# Patient Record
Sex: Female | Born: 1945
Health system: Southern US, Community
[De-identification: ages and names within clinical notes are randomized; demographics above are authoritative.]

## PROBLEM LIST (undated history)

## (undated) DIAGNOSIS — I82409 Acute embolism and thrombosis of unspecified deep veins of unspecified lower extremity: Secondary | ICD-10-CM

## (undated) DIAGNOSIS — I219 Acute myocardial infarction, unspecified: Secondary | ICD-10-CM

## (undated) DIAGNOSIS — E119 Type 2 diabetes mellitus without complications: Secondary | ICD-10-CM

## (undated) DIAGNOSIS — I10 Essential (primary) hypertension: Secondary | ICD-10-CM

## (undated) DIAGNOSIS — E785 Hyperlipidemia, unspecified: Secondary | ICD-10-CM

## (undated) DIAGNOSIS — I639 Cerebral infarction, unspecified: Secondary | ICD-10-CM

## (undated) DIAGNOSIS — M1990 Unspecified osteoarthritis, unspecified site: Secondary | ICD-10-CM

## (undated) DIAGNOSIS — I2510 Atherosclerotic heart disease of native coronary artery without angina pectoris: Secondary | ICD-10-CM

## (undated) HISTORY — DX: Acute embolism and thrombosis of unspecified deep veins of unspecified lower extremity: I82.409

## (undated) HISTORY — PX: CATARACT EXTRACTION: SUR2

## (undated) HISTORY — DX: Hyperlipidemia, unspecified: E78.5

## (undated) HISTORY — PX: CORONARY ANGIOPLASTY WITH STENT PLACEMENT: SHX49

---

## 2011-12-31 ENCOUNTER — Emergency Department (HOSPITAL_BASED_OUTPATIENT_CLINIC_OR_DEPARTMENT_OTHER)
Admission: EM | Admit: 2011-12-31 | Discharge: 2011-12-31 | Disposition: A | Discharge status: Home / Self Care | Payer: Managed Care, Other (non HMO) | Attending: Emergency Medicine | Admitting: Emergency Medicine

## 2011-12-31 ENCOUNTER — Encounter (HOSPITAL_BASED_OUTPATIENT_CLINIC_OR_DEPARTMENT_OTHER): Payer: Self-pay | Admitting: *Deleted

## 2011-12-31 ENCOUNTER — Emergency Department (HOSPITAL_BASED_OUTPATIENT_CLINIC_OR_DEPARTMENT_OTHER): Payer: Managed Care, Other (non HMO)

## 2011-12-31 DIAGNOSIS — Z79899 Other long term (current) drug therapy: Secondary | ICD-10-CM | POA: Insufficient documentation

## 2011-12-31 DIAGNOSIS — R0602 Shortness of breath: Secondary | ICD-10-CM | POA: Insufficient documentation

## 2011-12-31 DIAGNOSIS — Z8739 Personal history of other diseases of the musculoskeletal system and connective tissue: Secondary | ICD-10-CM | POA: Insufficient documentation

## 2011-12-31 DIAGNOSIS — E119 Type 2 diabetes mellitus without complications: Secondary | ICD-10-CM | POA: Insufficient documentation

## 2011-12-31 DIAGNOSIS — I2510 Atherosclerotic heart disease of native coronary artery without angina pectoris: Secondary | ICD-10-CM | POA: Insufficient documentation

## 2011-12-31 DIAGNOSIS — T50905A Adverse effect of unspecified drugs, medicaments and biological substances, initial encounter: Secondary | ICD-10-CM

## 2011-12-31 DIAGNOSIS — Z7982 Long term (current) use of aspirin: Secondary | ICD-10-CM | POA: Insufficient documentation

## 2011-12-31 DIAGNOSIS — Z794 Long term (current) use of insulin: Secondary | ICD-10-CM | POA: Insufficient documentation

## 2011-12-31 DIAGNOSIS — I10 Essential (primary) hypertension: Secondary | ICD-10-CM | POA: Insufficient documentation

## 2011-12-31 DIAGNOSIS — R197 Diarrhea, unspecified: Secondary | ICD-10-CM | POA: Insufficient documentation

## 2011-12-31 DIAGNOSIS — T465X5A Adverse effect of other antihypertensive drugs, initial encounter: Secondary | ICD-10-CM | POA: Insufficient documentation

## 2011-12-31 DIAGNOSIS — Z8673 Personal history of transient ischemic attack (TIA), and cerebral infarction without residual deficits: Secondary | ICD-10-CM | POA: Insufficient documentation

## 2011-12-31 DIAGNOSIS — R42 Dizziness and giddiness: Secondary | ICD-10-CM | POA: Insufficient documentation

## 2011-12-31 DIAGNOSIS — N390 Urinary tract infection, site not specified: Secondary | ICD-10-CM

## 2011-12-31 DIAGNOSIS — I252 Old myocardial infarction: Secondary | ICD-10-CM | POA: Insufficient documentation

## 2011-12-31 DIAGNOSIS — R112 Nausea with vomiting, unspecified: Secondary | ICD-10-CM | POA: Insufficient documentation

## 2011-12-31 HISTORY — DX: Type 2 diabetes mellitus without complications: E11.9

## 2011-12-31 HISTORY — DX: Acute myocardial infarction, unspecified: I21.9

## 2011-12-31 HISTORY — DX: Cerebral infarction, unspecified: I63.9

## 2011-12-31 HISTORY — DX: Essential (primary) hypertension: I10

## 2011-12-31 HISTORY — DX: Unspecified osteoarthritis, unspecified site: M19.90

## 2011-12-31 HISTORY — DX: Atherosclerotic heart disease of native coronary artery without angina pectoris: I25.10

## 2011-12-31 LAB — CBC WITH DIFFERENTIAL/PLATELET
Basophils Relative: 0 % (ref 0–1)
HCT: 39 % (ref 36.0–46.0)
Hemoglobin: 13.4 g/dL (ref 12.0–15.0)
Lymphocytes Relative: 32 % (ref 12–46)
Lymphs Abs: 2.9 10*3/uL (ref 0.7–4.0)
Monocytes Absolute: 0.5 10*3/uL (ref 0.1–1.0)
Monocytes Relative: 6 % (ref 3–12)
Neutro Abs: 5.1 10*3/uL (ref 1.7–7.7)
Neutrophils Relative %: 57 % (ref 43–77)
RBC: 4.67 MIL/uL (ref 3.87–5.11)
WBC: 9 10*3/uL (ref 4.0–10.5)

## 2011-12-31 LAB — URINALYSIS, ROUTINE W REFLEX MICROSCOPIC
Bilirubin Urine: NEGATIVE
Glucose, UA: 100 mg/dL — AB
Ketones, ur: NEGATIVE mg/dL
Nitrite: NEGATIVE
Protein, ur: >300 mg/dL — AB
pH: 5.5 (ref 5.0–8.0)

## 2011-12-31 LAB — COMPREHENSIVE METABOLIC PANEL
Albumin: 4.1 g/dL (ref 3.5–5.2)
Alkaline Phosphatase: 55 U/L (ref 39–117)
BUN: 13 mg/dL (ref 6–23)
CO2: 23 mEq/L (ref 19–32)
Chloride: 100 mEq/L (ref 96–112)
GFR calc non Af Amer: 65 mL/min — ABNORMAL LOW (ref >90)
Glucose, Bld: 91 mg/dL (ref 70–99)
Potassium: 3.1 mEq/L — ABNORMAL LOW (ref 3.5–5.1)
Total Bilirubin: 0.4 mg/dL (ref 0.3–1.2)

## 2011-12-31 LAB — URINE MICROSCOPIC-ADD ON

## 2011-12-31 LAB — TROPONIN I: Troponin I: <0.3 ng/mL (ref <0.30)

## 2011-12-31 MED ORDER — NITROFURANTOIN MONOHYD MACRO 100 MG PO CAPS
100.0000 mg | ORAL_CAPSULE | Freq: Once | ORAL | Status: AC
  Administered 2011-12-31: 100 mg via ORAL
  Filled 2011-12-31: qty 1

## 2011-12-31 MED ORDER — MECLIZINE HCL 25 MG PO TABS
25.0000 mg | ORAL_TABLET | Freq: Once | ORAL | Status: AC
  Administered 2011-12-31: 25 mg via ORAL

## 2011-12-31 MED ORDER — SODIUM CHLORIDE 0.9 % IV SOLN
INTRAVENOUS | Status: DC
  Administered 2011-12-31: 21:00:00 via INTRAVENOUS

## 2011-12-31 MED ORDER — MECLIZINE HCL 25 MG PO TABS
ORAL_TABLET | ORAL | Status: AC
  Filled 2011-12-31: qty 1

## 2011-12-31 MED ORDER — POTASSIUM CHLORIDE CRYS ER 20 MEQ PO TBCR
40.0000 meq | EXTENDED_RELEASE_TABLET | Freq: Once | ORAL | Status: AC
  Administered 2011-12-31: 40 meq via ORAL
  Filled 2011-12-31: qty 2

## 2011-12-31 MED ORDER — ONDANSETRON HCL 4 MG/2ML IJ SOLN
INTRAMUSCULAR | Status: AC
  Administered 2011-12-31: 4 mg via INTRAVENOUS
  Filled 2011-12-31: qty 2

## 2011-12-31 MED ORDER — ONDANSETRON HCL 4 MG/2ML IJ SOLN
4.0000 mg | Freq: Once | INTRAMUSCULAR | Status: AC
  Administered 2011-12-31: 4 mg via INTRAVENOUS

## 2011-12-31 MED ORDER — MECLIZINE HCL 25 MG PO TABS: 25.0000 mg | ORAL_TABLET | Freq: Four times a day (QID) | ORAL | Status: DC

## 2011-12-31 MED ORDER — NITROFURANTOIN MONOHYD MACRO 100 MG PO CAPS: 100.0000 mg | ORAL_CAPSULE | Freq: Two times a day (BID) | ORAL | Status: DC

## 2011-12-31 NOTE — ED Notes (Signed)
Pt reports chest pain "under both breasts" x 2 days- vomited x 3 today- c/o dizziness and sob

## 2011-12-31 NOTE — ED Notes (Signed)
pain L breast area and L arm with palpation and movement

## 2011-12-31 NOTE — ED Provider Notes (Signed)
History  This chart was scribed for Carleene Cooper III, MD by Ardeen Jourdain, ED Scribe. This patient was seen in room MH01/MH01 and the patient's care was started at 1903.  CSN: [id number]  Arrival date & time 12/31/11  1815   None     Chief Complaint  Patient presents with  . Chest Pain     The history is provided by the patient. No language interpreter was used.   Brittney [name] is a 66 y.o. female who presents to the Emergency Department complaining of CP with associated dizziness, fever, diaphoresis, diarrhea and emesis. She reports the symptoms started 2 days ago and have been gradually worsening. She denies SOB, hearing problems, ear ache, productive cough, sore throat, rash, syncope, visual disturbances and difficult or painful urination as associated symptoms. She also reports she has had 3 episodes of emesis today and describes her diarrhea as watery. She states movement aggravates her dizziness. She states she started a new medication, hydralazine,  about 2 days ago which she believed was the cause of the symptoms. She describes the CP as located under her breasts and only occuring when the area is touched. She reports the pain feels similar to when she had pneumonia in the past. She has a h/o MI, DM, HTN, CAD, arthritis and CVA.    Past Medical History  Diagnosis Date  . Heart attack   . Diabetes mellitus without complication   . Hypertension   . Coronary artery disease   . Arthritis   . Stroke     Past Surgical History  Procedure Date  . Coronary angioplasty with stent placement   . Cataract extraction   . Cesarean section     No family history on file.  History  Substance Use Topics  . Smoking status: Never Smoker   . Smokeless tobacco: Never Used  . Alcohol Use: No   No OB history available.  Review of Systems  Constitutional: Positive for fever and diaphoresis.  HENT: Negative for hearing loss, ear pain, congestion, sore throat and tinnitus.   Eyes:  Negative for visual disturbance.  Respiratory: Negative for cough and shortness of breath.   Cardiovascular: Positive for chest pain.  Gastrointestinal: Positive for nausea, vomiting and diarrhea. Negative for abdominal pain.  Genitourinary: Negative for dysuria and difficulty urinating.  Skin: Negative for rash.  Neurological: Positive for dizziness. Negative for syncope.  All other systems reviewed and are negative.    Allergies  Iodinated diagnostic agents and Shellfish allergy  Home Medications   Current Outpatient Rx  Name  Route  Sig  Dispense  Refill  . AMLODIPINE BESYLATE 10 MG PO TABS   Oral   Take 10 mg by mouth daily.         . ASPIRIN 81 MG PO TABS   Oral   Take 81 mg by mouth daily.         . ATORVASTATIN CALCIUM 40 MG PO TABS   Oral   Take 40 mg by mouth daily.         Marland Kitchen CLOPIDOGREL BISULFATE 75 MG PO TABS   Oral   Take 75 mg by mouth daily.         . ENALAPRIL MALEATE 20 MG PO TABS   Oral   Take 20 mg by mouth daily.         . FUROSEMIDE 40 MG PO TABS   Oral   Take 40 mg by mouth daily.         Marland Kitchen  GABAPENTIN 300 MG PO CAPS   Oral   Take 300 mg by mouth 3 (three) times daily.         Marland Kitchen HYDRALAZINE HCL 10 MG PO TABS   Oral   Take 10 mg by mouth 3 (three) times daily.         Marland Kitchen NOVOLOG FLEXPEN [place]   Subcutaneous   Inject into the skin.         . INSULIN GLARGINE 100 UNIT/ML [place] SOLN   Subcutaneous   Inject 62 Units into the skin at bedtime.         Marland Kitchen METFORMIN HCL 1000 MG PO TABS   Oral   Take 1,000 mg by mouth 2 (two) times daily with a meal.         . METOPROLOL SUCCINATE ER 100 MG PO TB24   Oral   Take 100 mg by mouth daily. Take with or immediately following a meal.         . OXYBUTYNIN CHLORIDE ER PO   Oral   Take 10 mg by mouth daily.           Triage Vitals: BP 183/72  Pulse 63  Temp 97.9 F (36.6 C) (Oral)  Resp 19  Ht 5\' 7"  (1.702 m)  Wt 230 lb (104.327 kg)  BMI 36.02 kg/m2  SpO2  100%  Physical Exam  Nursing note and vitals reviewed. Constitutional: She is oriented to person, place, and time. She appears well-developed and well-nourished. No distress.  HENT:  Head: Normocephalic and atraumatic.  Right Ear: External ear normal.  Left Ear: External ear normal.  Mouth/Throat: Oropharynx is clear and moist. No oropharyngeal exudate.  Eyes: Conjunctivae normal and EOM are normal. Pupils are equal, round, and reactive to light.       Nystagmus bilaterally   Neck: Normal range of motion. Neck supple. No tracheal deviation present.  Cardiovascular: Normal rate, regular rhythm and normal heart sounds.   Pulmonary/Chest: Effort normal and breath sounds normal. No respiratory distress. She exhibits no tenderness.  Abdominal: Soft. Bowel sounds are normal. She exhibits no distension. There is no tenderness.       Mildly protuberant but no mass or deformity   Musculoskeletal: Normal range of motion. She exhibits no edema.  Neurological: She is alert and oriented to person, place, and time. No cranial nerve deficit.       Sensory and motor functions intact   Skin: Skin is warm and dry.  Psychiatric: She has a normal mood and affect. Her behavior is normal.    ED Course  Procedures (including critical care time)  DIAGNOSTIC STUDIES: Oxygen Saturation is 100% on room air, normal by my interpretation.    COORDINATION OF CARE:   Labs Reviewed  CBC WITH DIFFERENTIAL  COMPREHENSIVE METABOLIC PANEL  TROPONIN I   6:55 PM  Date: 12/31/2011  Rate:61  Rhythm: normal sinus rhythm  QRS Axis: left  Intervals: normal QRS:  Poor R wave progression in precordial leads suggests old anterior myocardial infarction.  Left ventricular hypertrophy.  ST/T Wave abnormalities: normal  Conduction Disutrbances:none  Narrative Interpretation: Abnormal EKG  Old EKG Reviewed: none available   7:00 PM: Discussed treatment plan which includes an EKG, CXR, blood work, urinalysis and  medication for dizziness with pt at bedside and pt agreed to plan.  8:11 PM Vomiting in radiology suite.  Rx Zofran 4 mg IV.    8:52 PM Results for orders placed during the hospital encounter of 12/31/11  CBC WITH DIFFERENTIAL      Component Value Range   WBC 9.0  4.0 - 10.5 K/uL   RBC 4.67  3.87 - 5.11 MIL/uL   Hemoglobin 13.4  12.0 - 15.0 g/dL   HCT 16.1  09.6 - 04.5 %   MCV 83.5  78.0 - 100.0 fL   MCH 28.7  26.0 - 34.0 pg   MCHC 34.4  30.0 - 36.0 g/dL   RDW 40.9  81.1 - 91.4 %   Platelets 290  150 - 400 K/uL   Neutrophils Relative 57  43 - 77 %   Neutro Abs 5.1  1.7 - 7.7 K/uL   Lymphocytes Relative 32  12 - 46 %   Lymphs Abs 2.9  0.7 - 4.0 K/uL   Monocytes Relative 6  3 - 12 %   Monocytes Absolute 0.5  0.1 - 1.0 K/uL   Eosinophils Relative 5  0 - 5 %   Eosinophils Absolute 0.5  0.0 - 0.7 K/uL   Basophils Relative 0  0 - 1 %   Basophils Absolute 0.0  0.0 - 0.1 K/uL  COMPREHENSIVE METABOLIC PANEL      Component Value Range   Sodium 140  135 - 145 mEq/L   Potassium 3.1 (*) 3.5 - 5.1 mEq/L   Chloride 100  96 - 112 mEq/L   CO2 23  19 - 32 mEq/L   Glucose, Bld 91  70 - 99 mg/dL   BUN 13  6 - 23 mg/dL   Creatinine, Ser 7.82  0.50 - 1.10 mg/dL   Calcium 9.8  8.4 - 95.6 mg/dL   Total Protein 8.9 (*) 6.0 - 8.3 g/dL   Albumin 4.1  3.5 - 5.2 g/dL   AST 24  0 - 37 U/L   ALT 21  0 - 35 U/L   Alkaline Phosphatase 55  39 - 117 U/L   Total Bilirubin 0.4  0.3 - 1.2 mg/dL   GFR calc non Af Amer 65 (*) >90 mL/min   GFR calc Af Amer 76 (*) >90 mL/min  TROPONIN I      Component Value Range   Troponin I <0.30  <0.30 ng/mL   Dg Chest 1 View  12/31/2011  *RADIOLOGY REPORT*  Clinical Data: Anterior pleuritic chest pain.  CHEST - 1 VIEW  Comparison: None.  Findings: The heart size and vascularity are normal and the lungs are clear.  No acute osseous abnormality.  IMPRESSION: Normal chest.   Original Report Authenticated By: Francene Boyers, M.D.     K low at 3.1.  Pt to take 40 meq KCL  po.  IV with normal saline 250 ml per hour ordered, as pt has not been able to urinate.  She is no longer nauseated, following IV Zofran.  11:01 PM UA shows a UTI. I discussed pt's condition with her.  She had onset of the dizziness after starting hydralazine.  I think that her symptoms are from adverse reaction to hydralazine.  I advised her to stop hydralazine.  Rx Antivert 25 mg qid for dizziness, macrodantin 100 mg bid x 7 days for UTI, F/U with Dr. Luiz Iron in the office.  1. Vertigo   2. Adverse drug reaction   3. Urinary tract infection     I personally performed the services described in this documentation, which was scribed in my presence. The recorded information has been reviewed and is accurate. Osvaldo Human, MD  Carleene Cooper III, MD 12/31/11 [phone]

## 2011-12-31 NOTE — ED Notes (Signed)
EDP Ignacia Palma updated on pt status- ekg given to be reviewed

## 2012-01-01 ENCOUNTER — Encounter (HOSPITAL_BASED_OUTPATIENT_CLINIC_OR_DEPARTMENT_OTHER): Payer: Self-pay | Admitting: *Deleted

## 2012-01-01 ENCOUNTER — Emergency Department (HOSPITAL_BASED_OUTPATIENT_CLINIC_OR_DEPARTMENT_OTHER)
Admission: EM | Admit: 2012-01-01 | Discharge: 2012-01-01 | Disposition: A | Discharge status: Hospice | Payer: Managed Care, Other (non HMO) | Attending: Emergency Medicine | Admitting: Emergency Medicine

## 2012-01-01 DIAGNOSIS — I10 Essential (primary) hypertension: Secondary | ICD-10-CM | POA: Insufficient documentation

## 2012-01-01 DIAGNOSIS — R0789 Other chest pain: Secondary | ICD-10-CM | POA: Insufficient documentation

## 2012-01-01 DIAGNOSIS — R112 Nausea with vomiting, unspecified: Secondary | ICD-10-CM | POA: Insufficient documentation

## 2012-01-01 DIAGNOSIS — R197 Diarrhea, unspecified: Secondary | ICD-10-CM | POA: Insufficient documentation

## 2012-01-01 DIAGNOSIS — Z8673 Personal history of transient ischemic attack (TIA), and cerebral infarction without residual deficits: Secondary | ICD-10-CM | POA: Insufficient documentation

## 2012-01-01 DIAGNOSIS — Z79899 Other long term (current) drug therapy: Secondary | ICD-10-CM | POA: Insufficient documentation

## 2012-01-01 DIAGNOSIS — Z7902 Long term (current) use of antithrombotics/antiplatelets: Secondary | ICD-10-CM | POA: Insufficient documentation

## 2012-01-01 DIAGNOSIS — I2510 Atherosclerotic heart disease of native coronary artery without angina pectoris: Secondary | ICD-10-CM | POA: Insufficient documentation

## 2012-01-01 DIAGNOSIS — H9319 Tinnitus, unspecified ear: Secondary | ICD-10-CM | POA: Insufficient documentation

## 2012-01-01 DIAGNOSIS — R42 Dizziness and giddiness: Secondary | ICD-10-CM | POA: Insufficient documentation

## 2012-01-01 DIAGNOSIS — Z7982 Long term (current) use of aspirin: Secondary | ICD-10-CM | POA: Insufficient documentation

## 2012-01-01 DIAGNOSIS — E119 Type 2 diabetes mellitus without complications: Secondary | ICD-10-CM | POA: Insufficient documentation

## 2012-01-01 DIAGNOSIS — Z794 Long term (current) use of insulin: Secondary | ICD-10-CM | POA: Insufficient documentation

## 2012-01-01 DIAGNOSIS — Z9861 Coronary angioplasty status: Secondary | ICD-10-CM | POA: Insufficient documentation

## 2012-01-01 DIAGNOSIS — Z8739 Personal history of other diseases of the musculoskeletal system and connective tissue: Secondary | ICD-10-CM | POA: Insufficient documentation

## 2012-01-01 LAB — DIFFERENTIAL
Basophils Relative: 0 % (ref 0–1)
Eosinophils Relative: 2 % (ref 0–5)
Monocytes Absolute: 0.7 10*3/uL (ref 0.1–1.0)
Monocytes Relative: 8 % (ref 3–12)
Neutro Abs: 6.7 10*3/uL (ref 1.7–7.7)

## 2012-01-01 LAB — COMPREHENSIVE METABOLIC PANEL
Albumin: 3.7 g/dL (ref 3.5–5.2)
BUN: 12 mg/dL (ref 6–23)
Chloride: 104 mEq/L (ref 96–112)
Creatinine, Ser: 0.8 mg/dL (ref 0.50–1.10)
GFR calc non Af Amer: 75 mL/min — ABNORMAL LOW (ref >90)
Total Bilirubin: 0.4 mg/dL (ref 0.3–1.2)

## 2012-01-01 LAB — CBC
HCT: 36.9 % (ref 36.0–46.0)
Hemoglobin: 12.6 g/dL (ref 12.0–15.0)
MCH: 28.6 pg (ref 26.0–34.0)
MCHC: 34.1 g/dL (ref 30.0–36.0)
MCV: 83.7 fL (ref 78.0–100.0)

## 2012-01-01 LAB — TROPONIN I: Troponin I: <0.3 ng/mL (ref <0.30)

## 2012-01-01 LAB — APTT: aPTT: 35 seconds (ref 24–37)

## 2012-01-01 LAB — GLUCOSE, CAPILLARY: Glucose-Capillary: 122 mg/dL — ABNORMAL HIGH (ref 70–99)

## 2012-01-01 MED ORDER — SODIUM CHLORIDE 0.9 % IV SOLN
Freq: Once | INTRAVENOUS | Status: AC
  Administered 2012-01-01: 12:00:00 via INTRAVENOUS

## 2012-01-01 MED ORDER — MECLIZINE HCL 25 MG PO TABS
25.0000 mg | ORAL_TABLET | Freq: Once | ORAL | Status: AC
  Administered 2012-01-01: 25 mg via ORAL
  Filled 2012-01-01: qty 1

## 2012-01-01 MED ORDER — LORAZEPAM 2 MG/ML IJ SOLN
1.0000 mg | Freq: Once | INTRAMUSCULAR | Status: AC
  Administered 2012-01-01: 1 mg via INTRAVENOUS
  Filled 2012-01-01: qty 1

## 2012-01-01 MED ORDER — METOCLOPRAMIDE HCL 5 MG/ML IJ SOLN
10.0000 mg | Freq: Once | INTRAMUSCULAR | Status: AC
  Administered 2012-01-01: 10 mg via INTRAVENOUS
  Filled 2012-01-01: qty 2

## 2012-01-01 NOTE — ED Notes (Signed)
Blood collected via arterial stick by Gwenlyn Perking, RRT.  Pt tolerated well.

## 2012-01-01 NOTE — ED Notes (Signed)
Continues to be nauseated and feels weak at intervals she was seen here last pm has recently started hydralazine and thinks this may be causing her symptoms

## 2012-01-01 NOTE — ED Notes (Signed)
Attempted IV access and blood draw x 3 unsuccessful.

## 2012-01-01 NOTE — ED Notes (Signed)
MD at bedside. 

## 2012-01-01 NOTE — ED Notes (Signed)
Report called to Trula Ore, RN [facility]

## 2012-01-01 NOTE — ED Notes (Signed)
No IV access per EMS attempts x 3.  Pt reports improvement of nausea after dose of IM Zofran. Pt refuses to put a gown on for exam.

## 2012-01-01 NOTE — ED Provider Notes (Signed)
History     CSN: [id number]  Arrival date & time 01/01/12  1025   First MD Initiated Contact with Patient 01/01/12 1040      Chief Complaint  Patient presents with  . Nausea    (Consider location/radiation/quality/duration/timing/severity/associated sxs/prior treatment) HPI This 66 year old female has 4 days gradual onset vertigo brought on by any position changes and resolves if she stays perfectly still, every time she moves her head or body or tries to walk she has a feeling of is that she is spinning of nausea and vomits, she has vomited several times a day the last 4 days, she also has had one or 2 loose stools per day for last 4 days, she is a constant nonstop right anterior lower chest wall tenderness for several days, she did start taking a new blood pressure medicine within the last week and she thinks all her vertigo symptoms are related to that but had taken that medicine for a few days before her vertigo symptoms started, she has had no fever although she does feel cold over the last 4 days, she said no headache and no confusion, she is no change in speech vision swallowing or understanding, she is no focal or lateralizing weakness numbness or incoordination he states she's been unable to walk well for the last 4 days and cannot walk at all today due to her vertigo. She was given Zofran in the ED yesterday and Zofran prior to arrival by EMS today but states she cannot walk today do to the vertigo anytime she tries to move her head. She would not even try to sit up or walk in the ED upon arrival. She is no cough no shortness breath no abdominal pain. She is no trauma. She is no neck pain or back pain. She was seen yesterday in the emergency department for the same symptoms. She tried to take Antivert but couldn't because she vomited it as soon as she took it.  She does have a history of vertigo in the past but was not as bad as this.  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  Not Code Stroke since onset of Sxs 3 days ago. Past Medical History  Diagnosis Date  . Heart attack   . Diabetes mellitus without complication   . Hypertension   . Coronary artery disease   . Arthritis   . Stroke     Past Surgical History  Procedure Date  . Coronary angioplasty with stent placement   . Cataract extraction   . Cesarean section     History reviewed. No pertinent family history.  History  Substance Use Topics  . Smoking status: Never Smoker   . Smokeless tobacco: Never Used  . Alcohol Use: No    OB History    Grav Para Term Preterm Abortions TAB SAB Ect Mult Living                  Review of Systems 10 Systems reviewed and are negative for acute change except as noted in the HPI. Allergies  Iodinated diagnostic agents and Shellfish allergy  Home Medications   Current Outpatient Rx  Name  Route  Sig  Dispense  Refill  . AMLODIPINE BESYLATE 10 MG PO TABS   Oral   Take 10 mg by mouth daily.         . ASPIRIN 81 MG PO TABS   Oral   Take 81 mg by mouth  daily.         . ATORVASTATIN CALCIUM 40 MG PO TABS   Oral   Take 40 mg by mouth daily.         Marland Kitchen CLOPIDOGREL BISULFATE 75 MG PO TABS   Oral   Take 75 mg by mouth daily.         . ENALAPRIL MALEATE 20 MG PO TABS   Oral   Take 20 mg by mouth daily.         . FUROSEMIDE 40 MG PO TABS   Oral   Take 40 mg by mouth daily.         Marland Kitchen GABAPENTIN 300 MG PO CAPS   Oral   Take 300 mg by mouth 3 (three) times daily.         Marland Kitchen HYDRALAZINE HCL 10 MG PO TABS   Oral   Take 10 mg by mouth 3 (three) times daily.         Marland Kitchen NOVOLOG FLEXPEN [place]   Subcutaneous   Inject into the skin.         . INSULIN GLARGINE 100 UNIT/ML  SOLN   Subcutaneous   Inject 62 Units into the skin at bedtime.         Marland Kitchen MECLIZINE HCL 25 MG PO TABS   Oral   Take 1 tablet (25 mg total) by mouth 4 (four) times daily.   28 tablet   0   . METFORMIN HCL 1000 MG PO TABS    Oral   Take 1,000 mg by mouth 2 (two) times daily with a meal.         . METOPROLOL SUCCINATE ER 100 MG PO TB24   Oral   Take 100 mg by mouth daily. Take with or immediately following a meal.         . NITROFURANTOIN MONOHYD MACRO 100 MG PO CAPS   Oral   Take 1 capsule (100 mg total) by mouth 2 (two) times daily.   14 capsule   0   . OXYBUTYNIN CHLORIDE ER PO   Oral   Take 10 mg by mouth daily.           BP 161/47  Pulse 70  Temp 98.7 F (37.1 C) (Oral)  Resp 16  Ht 5\' 7"  (1.702 m)  Wt 230 lb (104.327 kg)  BMI 36.02 kg/m2  SpO2 100%  Physical Exam  Nursing note and vitals reviewed. Constitutional: She is oriented to person, place, and time.       Awake, alert, nontoxic appearance with baseline speech for patient.  HENT:  Head: Atraumatic.  Mouth/Throat: No oropharyngeal exudate.  Eyes: EOM are normal. Pupils are equal, round, and reactive to light. Right eye exhibits no discharge. Left eye exhibits no discharge.  Neck: Neck supple.  Cardiovascular: Normal rate and regular rhythm.   No murmur heard. Pulmonary/Chest: Effort normal and breath sounds normal. No stridor. No respiratory distress. She has no wheezes. She has no rales. She exhibits no tenderness.  Abdominal: Soft. Bowel sounds are normal. She exhibits no mass. There is no tenderness. There is no rebound.  Musculoskeletal: She exhibits no edema and no tenderness.       Baseline ROM, moves extremities with no obvious new focal weakness.  Lymphadenopathy:    She has no cervical adenopathy.  Neurological: She is alert and oriented to person, place, and time.       Awake, alert, cooperative and aware of situation; motor strength bilaterally;  sensation normal to light touch bilaterally; peripheral visual fields full to confrontation; no facial asymmetry; tongue midline; major cranial nerves appear intact; no pronator drift, normal finger to nose bilaterally, no nystagmus and no disconjugate gaze on initial  examination but the patient refuses to turn her head or sit up or walk during initial physical examination in the ED chart from yesterday notes that she did have nystagmus yesterday  Skin: No rash noted.  Psychiatric: She has a normal mood and affect.    ED Course  Procedures (including critical care time)  ECG: Sinus rhythm with occasional premature ventricular complexes, ventricular rate 63, normal axis, left ventricular hypertrophy, no significant change noted compared with 31 December 2011  Pt can briefly stand with assistance but cannot walk in ED, no nystagmus but subjective vertigo better but persists so [facility] paged at [facility] per Pt request vertigo r/o [id number]  [facility] accepting will call back with bed.[id number]   Labs Reviewed  COMPREHENSIVE METABOLIC PANEL - Abnormal; Notable for the following:    Potassium 3.4 (*)     Glucose, Bld 119 (*)     Total Protein 8.4 (*)     GFR calc non Af Amer 75 (*)     GFR calc Af Amer 87 (*)     All other components within normal limits  GLUCOSE, CAPILLARY - Abnormal; Notable for the following:    Glucose-Capillary 122 (*)     All other components within normal limits  PROTIME-INR  APTT  CBC  DIFFERENTIAL  TROPONIN I   No results found.   1. Vertigo       MDM  The patient appears reasonably stabilized for transfer considering the current resources, flow, and capabilities available in the ED at this time, and I doubt any other [facility] requiring further screening and/or treatment in the ED prior to transfer.        Hurman Horn, MD 01/09/12 [phone]

## 2012-01-01 NOTE — ED Notes (Signed)
Pt not ambulated.  She reports she continues to be dizzy and is unable to ambulate.  EDP speaking with consult.

## 2012-01-01 NOTE — ED Notes (Signed)
Report given to RN cassandra for room 621 at [facility] is here to transport pt to admission bed

## 2012-01-01 NOTE — ED Notes (Signed)
Unable to obtain blood draw with IV insertion.

## 2012-01-02 LAB — URINE CULTURE

## 2013-02-23 ENCOUNTER — Emergency Department (HOSPITAL_BASED_OUTPATIENT_CLINIC_OR_DEPARTMENT_OTHER): Payer: Managed Care, Other (non HMO)

## 2013-02-23 ENCOUNTER — Encounter (HOSPITAL_BASED_OUTPATIENT_CLINIC_OR_DEPARTMENT_OTHER): Payer: Self-pay | Admitting: Emergency Medicine

## 2013-02-23 ENCOUNTER — Emergency Department (HOSPITAL_BASED_OUTPATIENT_CLINIC_OR_DEPARTMENT_OTHER)
Admission: EM | Admit: 2013-02-23 | Discharge: 2013-02-23 | Disposition: A | Discharge status: Home / Self Care | Payer: Managed Care, Other (non HMO) | Attending: Emergency Medicine | Admitting: Emergency Medicine

## 2013-02-23 DIAGNOSIS — H9209 Otalgia, unspecified ear: Secondary | ICD-10-CM | POA: Insufficient documentation

## 2013-02-23 DIAGNOSIS — R05 Cough: Secondary | ICD-10-CM | POA: Insufficient documentation

## 2013-02-23 DIAGNOSIS — M79671 Pain in right foot: Secondary | ICD-10-CM

## 2013-02-23 DIAGNOSIS — Z7982 Long term (current) use of aspirin: Secondary | ICD-10-CM | POA: Insufficient documentation

## 2013-02-23 DIAGNOSIS — Z8673 Personal history of transient ischemic attack (TIA), and cerebral infarction without residual deficits: Secondary | ICD-10-CM | POA: Insufficient documentation

## 2013-02-23 DIAGNOSIS — I10 Essential (primary) hypertension: Secondary | ICD-10-CM | POA: Insufficient documentation

## 2013-02-23 DIAGNOSIS — R209 Unspecified disturbances of skin sensation: Secondary | ICD-10-CM | POA: Insufficient documentation

## 2013-02-23 DIAGNOSIS — H5789 Other specified disorders of eye and adnexa: Secondary | ICD-10-CM | POA: Insufficient documentation

## 2013-02-23 DIAGNOSIS — M129 Arthropathy, unspecified: Secondary | ICD-10-CM | POA: Insufficient documentation

## 2013-02-23 DIAGNOSIS — I252 Old myocardial infarction: Secondary | ICD-10-CM | POA: Insufficient documentation

## 2013-02-23 DIAGNOSIS — R61 Generalized hyperhidrosis: Secondary | ICD-10-CM | POA: Insufficient documentation

## 2013-02-23 DIAGNOSIS — E119 Type 2 diabetes mellitus without complications: Secondary | ICD-10-CM | POA: Insufficient documentation

## 2013-02-23 DIAGNOSIS — Z9861 Coronary angioplasty status: Secondary | ICD-10-CM | POA: Insufficient documentation

## 2013-02-23 DIAGNOSIS — Z794 Long term (current) use of insulin: Secondary | ICD-10-CM | POA: Insufficient documentation

## 2013-02-23 DIAGNOSIS — Z792 Long term (current) use of antibiotics: Secondary | ICD-10-CM | POA: Insufficient documentation

## 2013-02-23 DIAGNOSIS — M79609 Pain in unspecified limb: Secondary | ICD-10-CM | POA: Insufficient documentation

## 2013-02-23 DIAGNOSIS — Z7902 Long term (current) use of antithrombotics/antiplatelets: Secondary | ICD-10-CM | POA: Insufficient documentation

## 2013-02-23 DIAGNOSIS — Z79899 Other long term (current) drug therapy: Secondary | ICD-10-CM | POA: Insufficient documentation

## 2013-02-23 DIAGNOSIS — T700XXA Otitic barotrauma, initial encounter: Secondary | ICD-10-CM

## 2013-02-23 DIAGNOSIS — R059 Cough, unspecified: Secondary | ICD-10-CM | POA: Insufficient documentation

## 2013-02-23 DIAGNOSIS — I2510 Atherosclerotic heart disease of native coronary artery without angina pectoris: Secondary | ICD-10-CM | POA: Insufficient documentation

## 2013-02-23 LAB — CBC WITH DIFFERENTIAL/PLATELET
Basophils Absolute: 0 10*3/uL (ref 0.0–0.1)
Basophils Relative: 0 % (ref 0–1)
EOS PCT: 3 % (ref 0–5)
Eosinophils Absolute: 0.3 10*3/uL (ref 0.0–0.7)
HEMATOCRIT: 38 % (ref 36.0–46.0)
HEMOGLOBIN: 12.4 g/dL (ref 12.0–15.0)
LYMPHS ABS: 3.1 10*3/uL (ref 0.7–4.0)
LYMPHS PCT: 28 % (ref 12–46)
MCH: 28.5 pg (ref 26.0–34.0)
MCHC: 32.6 g/dL (ref 30.0–36.0)
MCV: 87.4 fL (ref 78.0–100.0)
MONO ABS: 1.2 10*3/uL — AB (ref 0.1–1.0)
MONOS PCT: 10 % (ref 3–12)
NEUTROS ABS: 6.6 10*3/uL (ref 1.7–7.7)
Neutrophils Relative %: 59 % (ref 43–77)
Platelets: 231 10*3/uL (ref 150–400)
RBC: 4.35 MIL/uL (ref 3.87–5.11)
RDW: 14 % (ref 11.5–15.5)
WBC: 11.3 10*3/uL — AB (ref 4.0–10.5)

## 2013-02-23 LAB — BASIC METABOLIC PANEL
BUN: 16 mg/dL (ref 6–23)
CHLORIDE: 99 meq/L (ref 96–112)
CO2: 26 meq/L (ref 19–32)
CREATININE: 0.9 mg/dL (ref 0.50–1.10)
Calcium: 9.4 mg/dL (ref 8.4–10.5)
GFR calc non Af Amer: 64 mL/min — ABNORMAL LOW (ref >90)
GFR, EST AFRICAN AMERICAN: 74 mL/min — AB (ref >90)
GLUCOSE: 200 mg/dL — AB (ref 70–99)
POTASSIUM: 3.8 meq/L (ref 3.7–5.3)
Sodium: 138 mEq/L (ref 137–147)

## 2013-02-23 MED ORDER — DOXYCYCLINE HYCLATE 100 MG PO CAPS: 100.0000 mg | ORAL_CAPSULE | Freq: Two times a day (BID) | ORAL | Status: DC

## 2013-02-23 MED ORDER — TRAMADOL HCL 50 MG PO TABS: 50.0000 mg | ORAL_TABLET | Freq: Four times a day (QID) | ORAL | Status: DC | PRN

## 2013-02-23 MED ORDER — DOXYCYCLINE HYCLATE 100 MG PO TABS
100.0000 mg | ORAL_TABLET | Freq: Once | ORAL | Status: AC
  Administered 2013-02-23: 100 mg via ORAL
  Filled 2013-02-23: qty 1

## 2013-02-23 NOTE — Discharge Instructions (Signed)
Recommend followup with your regular Dr. sometime in the next few days. Blood sugar not too bad here today 200. The pressure in the right ureter is due to eustachian tube dysfunction probably due to inflammation. X-rays of the right foot without any evidence of any bony abnormalities. Some concern for perhaps a developing cellulitis there take antibiotic as directed take pain medicine which will help both as directed. Make an appointment to followup with your doctor in the next few days return for any newer worse symptoms.

## 2013-02-23 NOTE — ED Notes (Signed)
Patient states no n/v/d, but states she has a generalized feeling of fatigue, some chills/sweating at times, no pain. Decrease in appetite. Non-productive cough in the past. Prescribed an antibiotic by [facility] that she just completed

## 2013-02-23 NOTE — ED Notes (Signed)
Patient states that she has been off and on sick since November. D[facility] was sick and [facility] "[facility] last week. Other members in the house all have been sick with same symptoms. States that she "doesn't feel good, and is tired often"

## 2013-02-23 NOTE — ED Provider Notes (Signed)
CSN: [id number]     Arrival date & time 02/23/13  1430 History  This chart was scribed for Shelda Jakes, MD by Blanchard Kelch, ED Scribe. The patient was seen in room MH03/MH03. Patient's care was started at 3:16 PM.    Chief Complaint  Patient presents with  . Cough  . Fatigue    Patient is a 68 y.o. female presenting with lower extremity pain and plugged ear sensation. The history is provided by the patient. No language interpreter was used.  Foot Pain This is a new problem. The current episode started more than 1 week ago. The problem occurs constantly. Pertinent negatives include no chest pain, no abdominal pain, no headaches and no shortness of breath. Nothing relieves the symptoms.  Ear Fullness The current episode started more than 1 week ago. The problem occurs constantly. The problem has not changed since onset.Pertinent negatives include no chest pain, no abdominal pain, no headaches and no shortness of breath. Nothing relieves the symptoms. Treatments tried: antibiotic. The treatment provided no relief.    HPI Comments: Brittney [name] is a 68 y.o. female with a history of DM, HTN, CAD, arthritis, MI, and stroke, who presents to the Emergency Department complaining of constant right ear pressure that began a few weeks ago. She has associated crusting and discharge of her left eye and diaphoresis. She denies any actual pain in her ear. She was first seen at Urgent Care in November for similar symptoms as well as cough and was given an antibiotic. She was then seen by her PCP, Dr. Luiz Iron, who gave her a refill on the antibiotic at some point in December for a diagnosis of bronchitis. A chest x-ray was taken at the appointment, which she states was negative for pneumonia. She describes the medication prescribed as "a very small, clear pill, more yellow than white," but does not remember the name of it. She was taking it three times a day and states that she just ran out the medication  today. She reports her grandson has been sick with similar symptoms. She denies fever, chills, visual changes, rhinorrhea, cough, chest pain, shortness of breath, abdominal pain, nausea, vomiting, diarrhea, dysuria, ankle swelling, rash, history of bleeding easily, back pain, or headache.  She is also complaining of constant pain to the bottom of her right foot that first appeared a few weeks ago. She is not sure if it ever bled. She also has another laceration on her left foot that was bleeding but she did not feel the bleeding or the pain. She is concerned about loss of sensation in her foot. She has not checked her blood sugar in awhile but states she has been compliantly taking her Lantus and Metformin. She is currently taking gabapentin and believes she has been on the medication since 2007. She is seen regularly by [facility] for her eyes due to history of DM.  PCP is Dr. Luiz Iron at [facility].   Past Medical History  Diagnosis Date  . Heart attack   . Diabetes mellitus without complication   . Hypertension   . Coronary artery disease   . Arthritis   . Stroke    Past Surgical History  Procedure Laterality Date  . Coronary angioplasty with stent placement    . Cataract extraction    . Cesarean section     No family history on file. History  Substance Use Topics  . Smoking status: Never Smoker   . Smokeless tobacco: Never Used  . Alcohol Use:  No   OB History   Grav Para Term Preterm Abortions TAB SAB Ect Mult Living                 Review of Systems  Constitutional: Positive for diaphoresis. Negative for fever and chills.  HENT: Negative for ear pain and rhinorrhea.        Positive for ear pressure.  Eyes: Positive for discharge. Negative for visual disturbance.  Respiratory: Negative for cough and shortness of breath.   Cardiovascular: Negative for chest pain and leg swelling.  Gastrointestinal: Negative for nausea, vomiting, abdominal pain and diarrhea.  Genitourinary:  Negative for dysuria.  Musculoskeletal: Positive for arthralgias. Negative for back pain.  Skin: Negative for rash.  Neurological: Positive for numbness. Negative for headaches.  Hematological: Does not bruise/bleed easily.  Psychiatric/Behavioral: Negative for confusion.    Allergies  Iodinated diagnostic agents and Shellfish allergy  Home Medications   Current Outpatient Rx  Name  Route  Sig  Dispense  Refill  . amLODipine (NORVASC) 10 MG tablet   Oral   Take 10 mg by mouth daily.         Marland Kitchen aspirin 81 MG tablet   Oral   Take 81 mg by mouth daily.         Marland Kitchen atorvastatin (LIPITOR) 40 MG tablet   Oral   Take 40 mg by mouth daily.         . clopidogrel (PLAVIX) 75 MG tablet   Oral   Take 75 mg by mouth daily.         Marland Kitchen doxycycline (VIBRAMYCIN) 100 MG capsule   Oral   Take 1 capsule (100 mg total) by mouth 2 (two) times daily.   14 capsule   0   . enalapril (VASOTEC) 20 MG tablet   Oral   Take 20 mg by mouth daily.         . furosemide (LASIX) 40 MG tablet   Oral   Take 40 mg by mouth daily.         Marland Kitchen gabapentin (NEURONTIN) 300 MG capsule   Oral   Take 300 mg by mouth 3 (three) times daily.         . hydrALAZINE (APRESOLINE) 10 MG tablet   Oral   Take 10 mg by mouth 3 (three) times daily.         . Insulin Aspart (NOVOLOG FLEXPEN [place])   Subcutaneous   Inject into the skin.         Marland Kitchen insulin glargine (LANTUS) 100 UNIT/ML injection   Subcutaneous   Inject 62 Units into the skin at bedtime.         . meclizine (ANTIVERT) 25 MG tablet   Oral   Take 1 tablet (25 mg total) by mouth 4 (four) times daily.   28 tablet   0   . metFORMIN (GLUCOPHAGE) 1000 MG tablet   Oral   Take 1,000 mg by mouth 2 (two) times daily with a meal.         . metoprolol succinate (TOPROL-XL) 100 MG 24 hr tablet   Oral   Take 100 mg by mouth daily. Take with or immediately following a meal.         . nitrofurantoin, macrocrystal-monohydrate, (MACROBID)  100 MG capsule   Oral   Take 1 capsule (100 mg total) by mouth 2 (two) times daily.   14 capsule   0   . OXYBUTYNIN CHLORIDE ER PO   Oral  Take 10 mg by mouth daily.         . traMADol (ULTRAM) 50 MG tablet   Oral   Take 1 tablet (50 mg total) by mouth every 6 (six) hours as needed.   15 tablet   0    Triage Vitals: BP 161/56  Pulse 70  Temp(Src) 98.3 F (36.8 C) (Oral)  Resp 18  Ht 5' 7.5" (1.715 m)  Wt 228 lb (103.42 kg)  BMI 35.16 kg/m2  SpO2 98%  Physical Exam  Nursing note and vitals reviewed. Constitutional: She is oriented to person, place, and time. She appears well-developed and well-nourished.  HENT:  Right Ear: Tympanic membrane is bulging. Tympanic membrane is not erythematous.  Left Ear: Tympanic membrane normal.  Mouth/Throat: Oropharynx is clear and moist and mucous membranes are normal.  Eyes: Pupils are equal, round, and reactive to light. Right conjunctiva is not injected. Left conjunctiva is injected.  Anterior chamber normal bilaterally.   Cardiovascular: Normal rate and regular rhythm.   Pulmonary/Chest: Effort normal. She has no wheezes. She has no rales.  Abdominal: Bowel sounds are normal. There is no tenderness.  Neurological: She is alert and oriented to person, place, and time.  Skin:  Cap refill 2 seconds left foot, second toe. Cap refill 1 second right foot. Area of callous, round in nature 1 cm just before the forefoot on lateal part of right foot, 3 cm surrounding erythema.Tendernes surrounding callous. Dorsum left foot 3 cm cut that is scabbed over without evidence of infection.     ED Course  Procedures (including critical care time)  DIAGNOSTIC STUDIES: Oxygen Saturation is 98% on room air, normal by my interpretation.    COORDINATION OF CARE: 3:38 PM -Will order CBC, BMP and right foot x-ray. Patient verbalizes understanding and agrees with treatment plan.  Results for orders placed during the hospital encounter of 02/23/13   CBC WITH DIFFERENTIAL      Result Value Range   WBC 11.3 (*) 4.0 - 10.5 K/uL   RBC 4.35  3.87 - 5.11 MIL/uL   Hemoglobin 12.4  12.0 - 15.0 g/dL   HCT 16.1  09.6 - 04.5 %   MCV 87.4  78.0 - 100.0 fL   MCH 28.5  26.0 - 34.0 pg   MCHC 32.6  30.0 - 36.0 g/dL   RDW 40.9  81.1 - 91.4 %   Platelets 231  150 - 400 K/uL   Neutrophils Relative % 59  43 - 77 %   Neutro Abs 6.6  1.7 - 7.7 K/uL   Lymphocytes Relative 28  12 - 46 %   Lymphs Abs 3.1  0.7 - 4.0 K/uL   Monocytes Relative 10  3 - 12 %   Monocytes Absolute 1.2 (*) 0.1 - 1.0 K/uL   Eosinophils Relative 3  0 - 5 %   Eosinophils Absolute 0.3  0.0 - 0.7 K/uL   Basophils Relative 0  0 - 1 %   Basophils Absolute 0.0  0.0 - 0.1 K/uL   WBC Morphology VACUOLATED NEUTROPHILS    BASIC METABOLIC PANEL      Result Value Range   Sodium 138  137 - 147 mEq/L   Potassium 3.8  3.7 - 5.3 mEq/L   Chloride 99  96 - 112 mEq/L   CO2 26  19 - 32 mEq/L   Glucose, Bld 200 (*) 70 - 99 mg/dL   BUN 16  6 - 23 mg/dL   Creatinine, Ser 7.82  0.50 -  1.10 mg/dL   Calcium 9.4  8.4 - 16.110.5 mg/dL   GFR calc non Af Amer 64 (*) >90 mL/min   GFR calc Af Amer 74 (*) >90 mL/min   Dg Foot Complete Right  02/23/2013   CLINICAL DATA:  Right foot pain. Nonhealing wound on the plantar aspect of the foot.  EXAM: RIGHT FOOT COMPLETE - 3+ VIEW  COMPARISON:  None.  FINDINGS: There is no fracture or dislocation. There are severe arthritic changes of the first metatarsal phalangeal joint. There are slight degenerative changes of the other metatarsal phalangeal joints. Old avulsion from the distal lateral aspect of the proximal phalanx of the little toe. Degenerative arthritic changes of the interphalangeal joints of the toes. Slight dorsal spurring at the tarsometatarsal joints. Small posterior and plantar calcaneal spurs.  No evidence of osteomyelitis.  IMPRESSION: No acute abnormalities.  Arthritic changes.   Electronically Signed   By: Geanie CooleyJim  Maxwell M.D.   On: 02/23/2013 16:23     Medications  doxycycline (VIBRA-TABS) tablet 100 mg (not administered)     EKG Interpretation   None       MDM   1. Pressure-related ear pain   2. Foot pain, right    Workup without obvious or significant findings. Some concern about maybe an early cellulitis to the sole of the right foot. Will treat with doxycycline. Also provide tramadol for pain since her discomfort there. Patient also with bulging the right eardrum not consistent with an otitis media but probably eustachian tube dysfunction due to an upper respiratory resolving inflammation. Tramadol also help with that. Important for patient to followup with her doctor in the next few days.  I personally performed the services described in this documentation, which was scribed in my presence. The recorded information has been reviewed and is accurate.     Shelda JakesScott W. Mandee Pluta, MD 02/23/13 [phone]

## 2013-02-27 ENCOUNTER — Emergency Department (HOSPITAL_BASED_OUTPATIENT_CLINIC_OR_DEPARTMENT_OTHER)
Admission: EM | Admit: 2013-02-27 | Discharge: 2013-02-27 | Disposition: A | Discharge status: Home / Self Care | Payer: Managed Care, Other (non HMO) | Attending: Emergency Medicine | Admitting: Emergency Medicine

## 2013-02-27 ENCOUNTER — Encounter (HOSPITAL_BASED_OUTPATIENT_CLINIC_OR_DEPARTMENT_OTHER): Payer: Self-pay | Admitting: Emergency Medicine

## 2013-02-27 DIAGNOSIS — E119 Type 2 diabetes mellitus without complications: Secondary | ICD-10-CM | POA: Insufficient documentation

## 2013-02-27 DIAGNOSIS — Z7902 Long term (current) use of antithrombotics/antiplatelets: Secondary | ICD-10-CM | POA: Insufficient documentation

## 2013-02-27 DIAGNOSIS — Z792 Long term (current) use of antibiotics: Secondary | ICD-10-CM | POA: Insufficient documentation

## 2013-02-27 DIAGNOSIS — Z794 Long term (current) use of insulin: Secondary | ICD-10-CM | POA: Insufficient documentation

## 2013-02-27 DIAGNOSIS — Z7982 Long term (current) use of aspirin: Secondary | ICD-10-CM | POA: Insufficient documentation

## 2013-02-27 DIAGNOSIS — Y9389 Activity, other specified: Secondary | ICD-10-CM | POA: Insufficient documentation

## 2013-02-27 DIAGNOSIS — Z9861 Coronary angioplasty status: Secondary | ICD-10-CM | POA: Insufficient documentation

## 2013-02-27 DIAGNOSIS — R221 Localized swelling, mass and lump, neck: Principal | ICD-10-CM

## 2013-02-27 DIAGNOSIS — I252 Old myocardial infarction: Secondary | ICD-10-CM | POA: Insufficient documentation

## 2013-02-27 DIAGNOSIS — I10 Essential (primary) hypertension: Secondary | ICD-10-CM | POA: Insufficient documentation

## 2013-02-27 DIAGNOSIS — M129 Arthropathy, unspecified: Secondary | ICD-10-CM | POA: Insufficient documentation

## 2013-02-27 DIAGNOSIS — I2510 Atherosclerotic heart disease of native coronary artery without angina pectoris: Secondary | ICD-10-CM | POA: Insufficient documentation

## 2013-02-27 DIAGNOSIS — Z79899 Other long term (current) drug therapy: Secondary | ICD-10-CM | POA: Insufficient documentation

## 2013-02-27 DIAGNOSIS — T7840XA Allergy, unspecified, initial encounter: Secondary | ICD-10-CM | POA: Diagnosis present

## 2013-02-27 DIAGNOSIS — T628X1A Toxic effect of other specified noxious substances eaten as food, accidental (unintentional), initial encounter: Secondary | ICD-10-CM | POA: Insufficient documentation

## 2013-02-27 DIAGNOSIS — Y9289 Other specified places as the place of occurrence of the external cause: Secondary | ICD-10-CM | POA: Insufficient documentation

## 2013-02-27 DIAGNOSIS — Z8673 Personal history of transient ischemic attack (TIA), and cerebral infarction without residual deficits: Secondary | ICD-10-CM | POA: Insufficient documentation

## 2013-02-27 DIAGNOSIS — R220 Localized swelling, mass and lump, head: Secondary | ICD-10-CM | POA: Insufficient documentation

## 2013-02-27 LAB — CBC WITH DIFFERENTIAL/PLATELET
Basophils Absolute: 0 K/uL (ref 0.0–0.1)
Basophils Relative: 0 % (ref 0–1)
Eosinophils Absolute: 0.5 K/uL (ref 0.0–0.7)
Eosinophils Relative: 4 % (ref 0–5)
HCT: 40.8 % (ref 36.0–46.0)
Hemoglobin: 13.1 g/dL (ref 12.0–15.0)
Lymphocytes Relative: 40 % (ref 12–46)
Lymphs Abs: 5.5 K/uL — ABNORMAL HIGH (ref 0.7–4.0)
MCH: 28.4 pg (ref 26.0–34.0)
MCHC: 32.1 g/dL (ref 30.0–36.0)
MCV: 88.3 fL (ref 78.0–100.0)
Monocytes Absolute: 1.4 K/uL — ABNORMAL HIGH (ref 0.1–1.0)
Monocytes Relative: 10 % (ref 3–12)
Neutro Abs: 6.3 K/uL (ref 1.7–7.7)
Neutrophils Relative %: 46 % (ref 43–77)
Platelets: 281 K/uL (ref 150–400)
RBC: 4.62 MIL/uL (ref 3.87–5.11)
RDW: 14.4 % (ref 11.5–15.5)
WBC: 13.7 K/uL — ABNORMAL HIGH (ref 4.0–10.5)

## 2013-02-27 LAB — BASIC METABOLIC PANEL WITH GFR
BUN: 18 mg/dL (ref 6–23)
CO2: 27 meq/L (ref 19–32)
Calcium: 9.9 mg/dL (ref 8.4–10.5)
Chloride: 98 meq/L (ref 96–112)
Creatinine, Ser: 1 mg/dL (ref 0.50–1.10)
GFR calc Af Amer: 66 mL/min — ABNORMAL LOW
GFR calc non Af Amer: 57 mL/min — ABNORMAL LOW
Glucose, Bld: 218 mg/dL — ABNORMAL HIGH (ref 70–99)
Potassium: 4.6 meq/L (ref 3.7–5.3)
Sodium: 139 meq/L (ref 137–147)

## 2013-02-27 LAB — GLUCOSE, CAPILLARY: Glucose-Capillary: 297 mg/dL — ABNORMAL HIGH (ref 70–99)

## 2013-02-27 MED ORDER — FAMOTIDINE 20 MG PO TABS: 20.0000 mg | ORAL_TABLET | Freq: Two times a day (BID) | ORAL | Status: DC

## 2013-02-27 MED ORDER — DIPHENHYDRAMINE HCL 50 MG/ML IJ SOLN
25.0000 mg | Freq: Once | INTRAMUSCULAR | Status: AC
  Administered 2013-02-27: 25 mg via INTRAVENOUS
  Filled 2013-02-27: qty 1

## 2013-02-27 MED ORDER — FAMOTIDINE IN NACL 20-0.9 MG/50ML-% IV SOLN
20.0000 mg | Freq: Once | INTRAVENOUS | Status: AC
  Administered 2013-02-27: 20 mg via INTRAVENOUS
  Filled 2013-02-27: qty 50

## 2013-02-27 MED ORDER — PREDNISONE 50 MG PO TABS: ORAL_TABLET | ORAL | Status: DC

## 2013-02-27 MED ORDER — METHYLPREDNISOLONE SODIUM SUCC 125 MG IJ SOLR
125.0000 mg | Freq: Once | INTRAMUSCULAR | Status: AC
  Administered 2013-02-27: 125 mg via INTRAVENOUS
  Filled 2013-02-27: qty 2

## 2013-02-27 NOTE — ED Notes (Signed)
Ambulatory to the bathroom 

## 2013-02-27 NOTE — ED Notes (Signed)
Pt. Up to restroom with no difficulty.  Pt. Able to breath normal and is in no distress.  Pt. Still noted with edema in face and upper lip and lower L lip.

## 2013-02-27 NOTE — ED Notes (Signed)
Patient denies pain and is resting comfortably.  

## 2013-02-27 NOTE — ED Notes (Signed)
Per Pt. Request discharge instructions were given to her and repeated to her husband via phone.  Pt. Aware of care done and need to follow up with PMD

## 2013-02-27 NOTE — ED Notes (Signed)
Resp. Therapist Roosevelt at bedside of Pt.

## 2013-02-27 NOTE — ED Notes (Signed)
Pt. Has noted continuous facial edema with mild oral swelling inside the mouth.  Pt. Has no air way obstruction noted.  Pt. Lips are edematous and Pt. Is able to speak clear sentences with no difficulty.

## 2013-02-27 NOTE — Discharge Instructions (Signed)
Allergies °Allergies may happen from anything your body is sensitive to. This may be food, medicines, pollens, chemicals, and nearly anything around you in everyday life that produces allergens. An allergen is anything that causes an allergy producing substance. Heredity is often a factor in causing these problems. This means you may have some of the same allergies as your parents. °Food allergies happen in all age groups. Food allergies are some of the most severe and life threatening. Some common food allergies are cow's milk, seafood, eggs, nuts, wheat, and soybeans. °SYMPTOMS  °· Swelling around the mouth. °· An itchy red rash or hives. °· Vomiting or diarrhea. °· Difficulty breathing. °SEVERE ALLERGIC REACTIONS ARE LIFE-THREATENING. °This reaction is called anaphylaxis. It can cause the mouth and throat to swell and cause difficulty with breathing and swallowing. In severe reactions only a trace amount of food (for example, peanut oil in a salad) may cause death within seconds. °Seasonal allergies occur in all age groups. These are seasonal because they usually occur during the same season every year. They may be a reaction to molds, grass pollens, or tree pollens. Other causes of problems are house dust mite allergens, pet dander, and mold spores. The symptoms often consist of nasal congestion, a runny itchy nose associated with sneezing, and tearing itchy eyes. There is often an associated itching of the mouth and ears. The problems happen when you come in contact with pollens and other allergens. Allergens are the particles in the air that the body reacts to with an allergic reaction. This causes you to release allergic antibodies. Through a chain of events, these eventually cause you to release histamine into the blood stream. Although it is meant to be protective to the body, it is this release that causes your discomfort. This is why you were given anti-histamines to feel better.  If you are unable to  pinpoint the offending allergen, it may be determined by skin or blood testing. Allergies cannot be cured but can be controlled with medicine. °Hay fever is a collection of all or some of the seasonal allergy problems. It may often be treated with simple over-the-counter medicine such as diphenhydramine. Take medicine as directed. Do not drink alcohol or drive while taking this medicine. Check with your caregiver or package insert for child dosages. °If these medicines are not effective, there are many new medicines your caregiver can prescribe. Stronger medicine such as nasal spray, eye drops, and corticosteroids may be used if the first things you try do not work well. Other treatments such as immunotherapy or desensitizing injections can be used if all else fails. Follow up with your caregiver if problems continue. These seasonal allergies are usually not life threatening. They are generally more of a nuisance that can often be handled using medicine. °HOME CARE INSTRUCTIONS  °· If unsure what causes a reaction, keep a diary of foods eaten and symptoms that follow. Avoid foods that cause reactions. °· If hives or rash are present: °· Take medicine as directed. °· You may use an over-the-counter antihistamine (diphenhydramine) for hives and itching as needed. °· Apply cold compresses (cloths) to the skin or take baths in cool water. Avoid hot baths or showers. Heat will make a rash and itching worse. °· If you are severely allergic: °· Following a treatment for a severe reaction, hospitalization is often required for closer follow-up. °· Wear a medic-alert bracelet or necklace stating the allergy. °· You and your family must learn how to give adrenaline or use   an anaphylaxis kit. °· If you have had a severe reaction, always carry your anaphylaxis kit or EpiPen® with you. Use this medicine as directed by your caregiver if a severe reaction is occurring. Failure to do so could have a fatal outcome. °SEEK MEDICAL  CARE IF: °· You suspect a food allergy. Symptoms generally happen within 30 minutes of eating a food. °· Your symptoms have not gone away within 2 days or are getting worse. °· You develop new symptoms. °· You want to retest yourself or your child with a food or drink you think causes an allergic reaction. Never do this if an anaphylactic reaction to that food or drink has happened before. Only do this under the care of a caregiver. °SEEK IMMEDIATE MEDICAL CARE IF:  °· You have difficulty breathing, are wheezing, or have a tight feeling in your chest or throat. °· You have a swollen mouth, or you have hives, swelling, or itching all over your body. °· You have had a severe reaction that has responded to your anaphylaxis kit or an EpiPen®. These reactions may return when the medicine has worn off. These reactions should be considered life threatening. °MAKE SURE YOU:  °· Understand these instructions. °· Will watch your condition. °· Will get help right away if you are not doing well or get worse. °Document Released: 04/11/2002 Document Revised: 05/13/2012 Document Reviewed: 09/16/2007 °[employer]® Patient Information ©2014 [employer]. ° °

## 2013-02-27 NOTE — ED Notes (Signed)
Mouth swelling x 2 hours.

## 2013-02-27 NOTE — ED Notes (Signed)
MD at bedside. 

## 2013-02-27 NOTE — ED Notes (Signed)
Pt. Still noted with facial edema and L side lower lip edema with noted entire upper lip edema.  Pt. Tongue is mildly swollen and Pt. Has no air way obstruction.

## 2013-02-27 NOTE — ED Provider Notes (Addendum)
CSN: [id number]     Arrival date & time 02/27/13  1529 History   First MD Initiated Contact with Patient 02/27/13 1537     Chief Complaint  Patient presents with  . Allergic Reaction   (Consider location/radiation/quality/duration/timing/severity/associated sxs/prior Treatment) Patient is a 68 y.o. female presenting with allergic reaction. The history is provided by the patient.  Allergic Reaction Presenting symptoms: swelling   Severity:  Mild Prior episodes: shellfish, contrast. Context: food   Relieved by:  Nothing Worsened by:  Nothing tried Ineffective treatments:  None tried   Past Medical History  Diagnosis Date  . Heart attack   . Diabetes mellitus without complication   . Hypertension   . Coronary artery disease   . Arthritis   . Stroke    Past Surgical History  Procedure Laterality Date  . Coronary angioplasty with stent placement    . Cataract extraction    . Cesarean section     No family history on file. History  Substance Use Topics  . Smoking status: Never Smoker   . Smokeless tobacco: Never Used  . Alcohol Use: No   OB History   Grav Para Term Preterm Abortions TAB SAB Ect Mult Living                 Review of Systems  Constitutional: Negative for fever and fatigue.  HENT: Negative for congestion and drooling.   Eyes: Negative for pain.  Respiratory: Negative for cough and shortness of breath.   Cardiovascular: Negative for chest pain.  Gastrointestinal: Negative for nausea, vomiting, abdominal pain and diarrhea.  Genitourinary: Negative for dysuria and hematuria.  Musculoskeletal: Negative for back pain, gait problem and neck pain.  Skin: Negative for color change.  Neurological: Negative for dizziness and headaches.  Hematological: Negative for adenopathy.  Psychiatric/Behavioral: Negative for behavioral problems.  All other systems reviewed and are negative.    Allergies  Iodinated diagnostic agents and Shellfish allergy  Home  Medications   Current Outpatient Rx  Name  Route  Sig  Dispense  Refill  . amLODipine (NORVASC) 10 MG tablet   Oral   Take 10 mg by mouth daily.         Marland Kitchen aspirin 81 MG tablet   Oral   Take 81 mg by mouth daily.         Marland Kitchen atorvastatin (LIPITOR) 40 MG tablet   Oral   Take 40 mg by mouth daily.         . clopidogrel (PLAVIX) 75 MG tablet   Oral   Take 75 mg by mouth daily.         Marland Kitchen doxycycline (VIBRAMYCIN) 100 MG capsule   Oral   Take 1 capsule (100 mg total) by mouth 2 (two) times daily.   14 capsule   0   . enalapril (VASOTEC) 20 MG tablet   Oral   Take 20 mg by mouth daily.         . furosemide (LASIX) 40 MG tablet   Oral   Take 40 mg by mouth daily.         Marland Kitchen gabapentin (NEURONTIN) 300 MG capsule   Oral   Take 300 mg by mouth 3 (three) times daily.         . hydrALAZINE (APRESOLINE) 10 MG tablet   Oral   Take 10 mg by mouth 3 (three) times daily.         . Insulin Aspart (NOVOLOG FLEXPEN [place])   Subcutaneous  Inject into the skin.         Marland Kitchen insulin glargine (LANTUS) 100 UNIT/ML injection   Subcutaneous   Inject 62 Units into the skin at bedtime.         . meclizine (ANTIVERT) 25 MG tablet   Oral   Take 1 tablet (25 mg total) by mouth 4 (four) times daily.   28 tablet   0   . metFORMIN (GLUCOPHAGE) 1000 MG tablet   Oral   Take 1,000 mg by mouth 2 (two) times daily with a meal.         . metoprolol succinate (TOPROL-XL) 100 MG 24 hr tablet   Oral   Take 100 mg by mouth daily. Take with or immediately following a meal.         . nitrofurantoin, macrocrystal-monohydrate, (MACROBID) 100 MG capsule   Oral   Take 1 capsule (100 mg total) by mouth 2 (two) times daily.   14 capsule   0   . OXYBUTYNIN CHLORIDE ER PO   Oral   Take 10 mg by mouth daily.         . traMADol (ULTRAM) 50 MG tablet   Oral   Take 1 tablet (50 mg total) by mouth every 6 (six) hours as needed.   15 tablet   0    BP 162/87  Pulse 70  Temp(Src)  98.3 F (36.8 C) (Oral)  Resp 20  Ht 5' 7.5" (1.715 m)  Wt 228 lb (103.42 kg)  BMI 35.16 kg/m2  SpO2 99% Physical Exam  Nursing note and vitals reviewed. Constitutional: She is oriented to person, place, and time. She appears well-developed and well-nourished.  HENT:  Head: Normocephalic.  Mouth/Throat: Oropharynx is clear and moist. No oropharyngeal exudate.  Mild asymmetric swelling to upper and lower lips.  Normal appearance of the tongue.  Normal-appearing posterior oropharynx.  Normal range of motion of the neck.  Eyes: Conjunctivae and EOM are normal. Pupils are equal, round, and reactive to light.  Neck: Normal range of motion. Neck supple.  Cardiovascular: Normal rate, regular rhythm, normal heart sounds and intact distal pulses.  Exam reveals no gallop and no friction rub.   No murmur heard. Pulmonary/Chest: Effort normal and breath sounds normal. No respiratory distress. She has no wheezes.  Abdominal: Soft. Bowel sounds are normal. There is no tenderness. There is no rebound and no guarding.  Musculoskeletal: Normal range of motion. She exhibits no edema and no tenderness.  Neurological: She is alert and oriented to person, place, and time.  Skin: Skin is warm and dry.  Psychiatric: She has a normal mood and affect. Her behavior is normal.    ED Course  Procedures (including critical care time) Labs Review Labs Reviewed  GLUCOSE, CAPILLARY - Abnormal; Notable for the following:    Glucose-Capillary 297 (*)    All other components within normal limits  CBC WITH DIFFERENTIAL - Abnormal; Notable for the following:    WBC 13.7 (*)    Lymphs Abs 5.5 (*)    Monocytes Absolute 1.4 (*)    All other components within normal limits  BASIC METABOLIC PANEL - Abnormal; Notable for the following:    Glucose, Bld 218 (*)    GFR calc non Af Amer 57 (*)    GFR calc Af Amer 66 (*)    All other components within normal limits   Imaging Review No results found.  EKG  Interpretation   None  MDM   1. Allergic reaction    3:46 PM 68 y.o. female who presents with an allergic reaction. She states that she AAA hamburger from [employer] at approximately 1 PM. She noted shortly thereafter that she was having some lip swelling. She denies any tongue swelling, shortness of breath, chest pain, vomiting, diarrhea, rash, itching, or abdominal upset. She appears well on exam and her vital signs are unremarkable here. Normal appearance of the posterior oropharynx. Will give Solu-Medrol, Pepcid, and Benadryl. Will closely monitor.  During the first 1-2 hours of her stay the swelling became evenly distributed in her lips/cheeks. I re-dosed benadryl and will cont to monitor. She continues to deny an oral involvement and there is none evident on exam.   9:49 PM: Pt has been monitored in the ED. Swelling appears to be decreasing, pt would like to go home. Likely allergic rxn vs angioedema. I did not initially see that she was on an ACE inh. I called her the next day and left a message telling her to d/c the ACE inh for now and f/u w/ her pcp to discuss alternatives. I have discussed the diagnosis/risks/treatment options with the patient and believe the pt to be eligible for discharge home to follow-up with pcp in 2 days. We also discussed returning to the ED immediately if new or worsening sx occur. We discussed the sx which are most concerning (e.g., worsening swelling, sob, trouble swallowing or handling secretions) that necessitate immediate return. Medications administered to the patient during their visit and any new prescriptions provided to the patient are listed below.  Medications given during this visit Medications  methylPREDNISolone sodium succinate (SOLU-MEDROL) 125 mg/2 mL injection 125 mg (125 mg Intravenous Given 02/27/13 1601)  diphenhydrAMINE (BENADRYL) injection 25 mg (25 mg Intravenous Given 02/27/13 1602)  famotidine (PEPCID) IVPB 20 mg (0  mg Intravenous Stopped 02/27/13 1644)  diphenhydrAMINE (BENADRYL) injection 25 mg (25 mg Intravenous Given 02/27/13 2004)    Discharge Medication List as of 02/27/2013  9:50 PM    START taking these medications   Details  famotidine (PEPCID) 20 MG tablet Take 1 tablet (20 mg total) by mouth 2 (two) times daily., Starting 02/27/2013, Until Discontinued, Print    predniSONE (DELTASONE) 50 MG tablet Take 1 tablet by mouth daily for the next 4 days., Print         Junius ArgyleForrest S Sumiye Hirth, MD 02/28/13 [id number]  Junius ArgyleForrest S Haya Hemler, MD 02/28/13 [id number]

## 2013-02-27 NOTE — ED Notes (Signed)
Pt. Asking for food and drink.  Asked EDP about giving Pt. Food and drink.  NPO for now per EDP.

## 2013-02-27 NOTE — ED Notes (Signed)
Recheck on Pt.   Pt. Has noted more edema in the face now with no resp. Distress noted.  Pt. Upper lip noted with more edema and both cheeks on her face are more edematous than 30 mins ago.

## 2013-02-27 NOTE — ED Notes (Signed)
Pt. In no resp.distress. 

## 2013-02-27 NOTE — ED Notes (Signed)
Reported to EDP   EDP in to see and assess the Pt.

## 2014-12-26 ENCOUNTER — Encounter (HOSPITAL_BASED_OUTPATIENT_CLINIC_OR_DEPARTMENT_OTHER): Payer: Self-pay | Admitting: Emergency Medicine

## 2014-12-26 ENCOUNTER — Emergency Department (HOSPITAL_BASED_OUTPATIENT_CLINIC_OR_DEPARTMENT_OTHER)
Admission: EM | Admit: 2014-12-26 | Discharge: 2014-12-26 | Disposition: A | Discharge status: Home / Self Care | Payer: Medicare HMO | Attending: Emergency Medicine | Admitting: Emergency Medicine

## 2014-12-26 DIAGNOSIS — I10 Essential (primary) hypertension: Secondary | ICD-10-CM | POA: Insufficient documentation

## 2014-12-26 DIAGNOSIS — S51812A Laceration without foreign body of left forearm, initial encounter: Secondary | ICD-10-CM | POA: Insufficient documentation

## 2014-12-26 DIAGNOSIS — Y9289 Other specified places as the place of occurrence of the external cause: Secondary | ICD-10-CM | POA: Diagnosis not present

## 2014-12-26 DIAGNOSIS — W208XXA Other cause of strike by thrown, projected or falling object, initial encounter: Secondary | ICD-10-CM | POA: Diagnosis not present

## 2014-12-26 DIAGNOSIS — IMO0002 Reserved for concepts with insufficient information to code with codable children: Secondary | ICD-10-CM

## 2014-12-26 DIAGNOSIS — E119 Type 2 diabetes mellitus without complications: Secondary | ICD-10-CM | POA: Insufficient documentation

## 2014-12-26 DIAGNOSIS — Y998 Other external cause status: Secondary | ICD-10-CM | POA: Diagnosis not present

## 2014-12-26 DIAGNOSIS — Z792 Long term (current) use of antibiotics: Secondary | ICD-10-CM | POA: Insufficient documentation

## 2014-12-26 DIAGNOSIS — I252 Old myocardial infarction: Secondary | ICD-10-CM | POA: Insufficient documentation

## 2014-12-26 DIAGNOSIS — I2510 Atherosclerotic heart disease of native coronary artery without angina pectoris: Secondary | ICD-10-CM | POA: Diagnosis not present

## 2014-12-26 DIAGNOSIS — Z79899 Other long term (current) drug therapy: Secondary | ICD-10-CM | POA: Diagnosis not present

## 2014-12-26 DIAGNOSIS — Z794 Long term (current) use of insulin: Secondary | ICD-10-CM | POA: Diagnosis not present

## 2014-12-26 DIAGNOSIS — Z7902 Long term (current) use of antithrombotics/antiplatelets: Secondary | ICD-10-CM | POA: Diagnosis not present

## 2014-12-26 DIAGNOSIS — Y9389 Activity, other specified: Secondary | ICD-10-CM | POA: Diagnosis not present

## 2014-12-26 DIAGNOSIS — Z8673 Personal history of transient ischemic attack (TIA), and cerebral infarction without residual deficits: Secondary | ICD-10-CM | POA: Diagnosis not present

## 2014-12-26 DIAGNOSIS — M1990 Unspecified osteoarthritis, unspecified site: Secondary | ICD-10-CM | POA: Diagnosis not present

## 2014-12-26 DIAGNOSIS — Z9861 Coronary angioplasty status: Secondary | ICD-10-CM | POA: Insufficient documentation

## 2014-12-26 DIAGNOSIS — Z7982 Long term (current) use of aspirin: Secondary | ICD-10-CM | POA: Insufficient documentation

## 2014-12-26 DIAGNOSIS — S59912A Unspecified injury of left forearm, initial encounter: Secondary | ICD-10-CM | POA: Diagnosis present

## 2014-12-26 MED ORDER — CEPHALEXIN 500 MG PO CAPS: 500.0000 mg | ORAL_CAPSULE | Freq: Three times a day (TID) | ORAL | Status: DC

## 2014-12-26 MED ORDER — LIDOCAINE-EPINEPHRINE (PF) 2 %-1:200000 IJ SOLN
10.0000 mL | Freq: Once | INTRAMUSCULAR | Status: AC
  Administered 2014-12-26: 10 mL
  Filled 2014-12-26: qty 10

## 2014-12-26 NOTE — Discharge Instructions (Signed)
Please read and follow all provided instructions.  Your diagnoses today include:  1. Laceration    Tests performed today include:  Vital signs. See below for your results today.   Medications prescribed:   Keflex (cephalexin) - antibiotic  You have been prescribed an antibiotic medicine: take the entire course of medicine even if you are feeling better. Stopping early can cause the antibiotic not to work.  Take any prescribed medications only as directed.   Home care instructions:  Follow any educational materials and wound care instructions contained in this packet.   Keep affected area above the level of your heart when possible to minimize swelling. Wash area gently twice a day with warm soapy water. Do not apply alcohol or hydrogen peroxide. Cover the area if it draining or weeping.   Follow-up instructions: Suture Removal: Return to the Emergency Department or see your primary care care doctor in 7 days for a recheck of your wound and removal of your sutures or staples.    Return instructions:  Return to the Emergency Department if you have:  Fever  Worsening pain  Worsening swelling of the wound  Pus draining from the wound  Redness of the skin that moves away from the wound, especially if it streaks away from the affected area   Any other emergent concerns  Your vital signs today were: BP 186/71 mmHg   Pulse 82   Temp(Src) 98 F (36.7 C) (Oral)   Resp 20   Wt 103.42 kg   SpO2 100% If your blood pressure (BP) was elevated above 135/85 this visit, please have this repeated by your doctor within one month. --------------

## 2014-12-26 NOTE — ED Notes (Signed)
Dressing applied to rt lower forearm. Covered with Kerlix dressing. Educated patient on the need to keep it clean and dry for next day. Patient verbalized understanding .

## 2014-12-26 NOTE — ED Provider Notes (Signed)
CSN: [id number]     Arrival date & time 12/26/14  1826 History   First MD Initiated Contact with Patient 12/26/14 1834     Chief Complaint  Patient presents with  . Extremity Laceration     (Consider location/radiation/quality/duration/timing/severity/associated sxs/prior Treatment) HPI Comments: Patient with history of diabetes - presents with complaint of laceration to her left forearm sustained just prior to arrival when a mirror fell and broke cutting her arm. Patient rinsed with water prior to arrival. No other treatments. Patient c/o some soreness. No weakness in her hand. Patient thinks that her tetanus was within the past 10 years. Onset of symptoms acute. Course is constant. Nothing makes symptoms better or worse.  The history is provided by the patient.    Past Medical History  Diagnosis Date  . Heart attack (HCC)   . Diabetes mellitus without complication (HCC)   . Hypertension   . Coronary artery disease   . Arthritis   . Stroke [facility](HCC)    Past Surgical History  Procedure Laterality Date  . Coronary angioplasty with stent placement    . Cataract extraction    . Cesarean section     History reviewed. No pertinent family history. Social History  Substance Use Topics  . Smoking status: Never Smoker   . Smokeless tobacco: Never Used  . Alcohol Use: No   OB History    No data available     Review of Systems  Constitutional: Negative for activity change.  Musculoskeletal: Positive for myalgias.  Skin: Positive for wound.  Neurological: Negative for weakness and numbness.      Allergies  Iodinated diagnostic agents and Shellfish allergy  Home Medications   Prior to Admission medications   Medication Sig Start Date End Date Taking? Authorizing Provider  amLODipine (NORVASC) 10 MG tablet Take 10 mg by mouth daily.    Historical Provider, MD  aspirin 81 MG tablet Take 81 mg by mouth daily.    Historical Provider, MD  atorvastatin (LIPITOR) 40 MG tablet Take  40 mg by mouth daily.    Historical Provider, MD  clopidogrel (PLAVIX) 75 MG tablet Take 75 mg by mouth daily.    Historical Provider, MD  doxycycline (VIBRAMYCIN) 100 MG capsule Take 1 capsule (100 mg total) by mouth 2 (two) times daily. 02/23/13   Vanetta MuldersScott Zackowski, MD  enalapril (VASOTEC) 20 MG tablet Take 20 mg by mouth daily.    Historical Provider, MD  famotidine (PEPCID) 20 MG tablet Take 1 tablet (20 mg total) by mouth 2 (two) times daily. 02/27/13   Purvis SheffieldForrest Harrison, MD  furosemide (LASIX) 40 MG tablet Take 40 mg by mouth daily.    Historical Provider, MD  gabapentin (NEURONTIN) 300 MG capsule Take 300 mg by mouth 3 (three) times daily.    Historical Provider, MD  hydrALAZINE (APRESOLINE) 10 MG tablet Take 10 mg by mouth 3 (three) times daily.    Historical Provider, MD  Insulin Aspart (NOVOLOG FLEXPEN [place]) Inject into the skin.    Historical Provider, MD  insulin glargine (LANTUS) 100 UNIT/ML injection Inject 62 Units into the skin at bedtime.    Historical Provider, MD  meclizine (ANTIVERT) 25 MG tablet Take 1 tablet (25 mg total) by mouth 4 (four) times daily. 12/31/11   Carleene CooperAlan Davidson, MD  metFORMIN (GLUCOPHAGE) 1000 MG tablet Take 1,000 mg by mouth 2 (two) times daily with a meal.    Historical Provider, MD  metoprolol succinate (TOPROL-XL) 100 MG 24 hr tablet Take 100 mg  by mouth daily. Take with or immediately following a meal.    Historical Provider, MD  nitrofurantoin, macrocrystal-monohydrate, (MACROBID) 100 MG capsule Take 1 capsule (100 mg total) by mouth 2 (two) times daily. 12/31/11   Carleene Cooper, MD  OXYBUTYNIN CHLORIDE ER PO Take 10 mg by mouth daily.    Historical Provider, MD  predniSONE (DELTASONE) 50 MG tablet Take 1 tablet by mouth daily for the next 4 days. 02/27/13   Purvis Sheffield, MD  traMADol (ULTRAM) 50 MG tablet Take 1 tablet (50 mg total) by mouth every 6 (six) hours as needed. 02/23/13   Vanetta Mulders, MD   BP 186/71 mmHg  Pulse 82  Temp(Src) 98 F (36.7 C)  (Oral)  Resp 20  Wt 103.42 kg  SpO2 100% Physical Exam  Constitutional: She appears well-developed and well-nourished.  HENT:  Head: Normocephalic and atraumatic.  Eyes: Conjunctivae are normal.  Neck: Normal range of motion. Neck supple.  Pulmonary/Chest: No respiratory distress.  Musculoskeletal:  Full range of motion distal to the laceration. Normal flexion and extension of fingers, hand, wrist. Normal distal sensation. Cap refill normal.  Neurological: She is alert.  Skin: Skin is warm and dry.  There is a 3 cm C-shaped flap laceration to the left forearm, hemostatic, clean.  Psychiatric: She has a normal mood and affect.  Nursing note and vitals reviewed.   ED Course  Procedures (including critical care time) Labs Review Labs Reviewed - No data to display  Imaging Review No results found. I have personally reviewed and evaluated these images and lab results as part of my medical decision-making.   EKG Interpretation None       7:05 PM Patient seen and examined. Work-up initiated. Medications ordered.   Vital signs reviewed and are as follows: BP 186/71 mmHg  Pulse 82  Temp(Src) 98 F (36.7 C) (Oral)  Resp 20  Wt 103.42 kg  SpO2 100%  LACERATION REPAIR Performed by: Carolee Rota Authorized by: Carolee Rota Consent: Verbal consent obtained. Risks and benefits: risks, benefits and alternatives were discussed Consent given by: patient Patient identity confirmed: provided demographic data Prepped and Draped in normal sterile fashion Wound explored  Laceration Location: R forearm  Laceration Length: 3cm  No Foreign Bodies seen or palpated  Anesthesia: local infiltration  Local anesthetic: lidocaine 2% with epinephrine  Anesthetic total: 4 ml  Irrigation method: Skin scrub dermal cleanser  Amount of cleaning: standard  Skin closure: 6-0 Ethilon   Number of sutures: 8   Technique: Simple interrupted   Patient tolerance: Patient tolerated  the procedure well with no immediate complications.   8:05 PM Patient counseled on wound care. Keflex given as prophylaxis given history of diabetes. Patient counseled on need to return or see PCP/urgent care for suture removal in 7 days. Patient was urged to return to the Emergency Department urgently with worsening pain, swelling, expanding erythema especially if it streaks away from the affected area, fever, or if they have any other concerns. Patient verbalized understanding.    MDM   Final diagnoses:  Laceration   Patient with left arm flap laceration. Patient accepted this is up-to-date. She is diabetic. Wound repaired without complication. Return instructions as above. Full range of motion of upper extremity and no concern for tendon injury.    Renne Crigler, PA-C 12/26/14 2007  Mirian Mo, MD 12/26/14 [phone]

## 2014-12-26 NOTE — ED Notes (Signed)
Pt reports that she was trying to hang christmas decorations and mirror fell down and caused a laceration to right arm, bleeding controlled,

## 2015-01-11 DIAGNOSIS — E785 Hyperlipidemia, unspecified: Secondary | ICD-10-CM | POA: Insufficient documentation

## 2015-10-27 DIAGNOSIS — R0989 Other specified symptoms and signs involving the circulatory and respiratory systems: Secondary | ICD-10-CM | POA: Insufficient documentation

## 2015-10-27 DIAGNOSIS — R911 Solitary pulmonary nodule: Secondary | ICD-10-CM | POA: Insufficient documentation

## 2016-01-03 DIAGNOSIS — E1140 Type 2 diabetes mellitus with diabetic neuropathy, unspecified: Secondary | ICD-10-CM | POA: Insufficient documentation

## 2016-01-03 DIAGNOSIS — I6529 Occlusion and stenosis of unspecified carotid artery: Secondary | ICD-10-CM | POA: Insufficient documentation

## 2016-01-03 DIAGNOSIS — I10 Essential (primary) hypertension: Secondary | ICD-10-CM | POA: Insufficient documentation

## 2016-01-03 DIAGNOSIS — I252 Old myocardial infarction: Secondary | ICD-10-CM | POA: Insufficient documentation

## 2016-01-03 DIAGNOSIS — H9313 Tinnitus, bilateral: Secondary | ICD-10-CM | POA: Insufficient documentation

## 2016-01-03 DIAGNOSIS — E11319 Type 2 diabetes mellitus with unspecified diabetic retinopathy without macular edema: Secondary | ICD-10-CM | POA: Insufficient documentation

## 2016-05-08 ENCOUNTER — Emergency Department (HOSPITAL_BASED_OUTPATIENT_CLINIC_OR_DEPARTMENT_OTHER)
Admission: EM | Admit: 2016-05-08 | Discharge: 2016-05-08 | Disposition: A | Discharge status: Home / Self Care | Payer: Medicare Other | Attending: Emergency Medicine | Admitting: Emergency Medicine

## 2016-05-08 ENCOUNTER — Emergency Department (HOSPITAL_BASED_OUTPATIENT_CLINIC_OR_DEPARTMENT_OTHER): Payer: Medicare Other

## 2016-05-08 ENCOUNTER — Encounter (HOSPITAL_BASED_OUTPATIENT_CLINIC_OR_DEPARTMENT_OTHER): Payer: Self-pay | Admitting: Emergency Medicine

## 2016-05-08 DIAGNOSIS — Y929 Unspecified place or not applicable: Secondary | ICD-10-CM | POA: Diagnosis not present

## 2016-05-08 DIAGNOSIS — Z7982 Long term (current) use of aspirin: Secondary | ICD-10-CM | POA: Diagnosis not present

## 2016-05-08 DIAGNOSIS — I10 Essential (primary) hypertension: Secondary | ICD-10-CM | POA: Diagnosis not present

## 2016-05-08 DIAGNOSIS — S91331A Puncture wound without foreign body, right foot, initial encounter: Secondary | ICD-10-CM | POA: Diagnosis not present

## 2016-05-08 DIAGNOSIS — Y999 Unspecified external cause status: Secondary | ICD-10-CM | POA: Diagnosis not present

## 2016-05-08 DIAGNOSIS — Y9389 Activity, other specified: Secondary | ICD-10-CM | POA: Diagnosis not present

## 2016-05-08 DIAGNOSIS — Z794 Long term (current) use of insulin: Secondary | ICD-10-CM | POA: Diagnosis not present

## 2016-05-08 DIAGNOSIS — S99921A Unspecified injury of right foot, initial encounter: Secondary | ICD-10-CM | POA: Diagnosis present

## 2016-05-08 DIAGNOSIS — W450XXA Nail entering through skin, initial encounter: Secondary | ICD-10-CM | POA: Diagnosis not present

## 2016-05-08 DIAGNOSIS — Z23 Encounter for immunization: Secondary | ICD-10-CM | POA: Diagnosis not present

## 2016-05-08 DIAGNOSIS — I2510 Atherosclerotic heart disease of native coronary artery without angina pectoris: Secondary | ICD-10-CM | POA: Diagnosis not present

## 2016-05-08 MED ORDER — CIPROFLOXACIN HCL 500 MG PO TABS
500.0000 mg | ORAL_TABLET | Freq: Two times a day (BID) | ORAL | 0 refills | Status: DC
Start: 2016-05-08 — End: 2018-10-11

## 2016-05-08 MED ORDER — TETANUS-DIPHTH-ACELL PERTUSSIS 5-2.5-18.5 LF-MCG/0.5 IM SUSP
0.5000 mL | Freq: Once | INTRAMUSCULAR | Status: AC
  Administered 2016-05-08: 0.5 mL via INTRAMUSCULAR
  Filled 2016-05-08: qty 0.5

## 2016-05-08 MED FILL — CIPROFLOXACIN HCL 500 MG TA: 500 | 7 days supply | Qty: 14 | Fill #0

## 2016-05-08 NOTE — ED Notes (Signed)
Bacitracin bandaid applied.

## 2016-05-08 NOTE — ED Triage Notes (Signed)
Pt stepped on nail, reports right foot pain.

## 2016-05-08 NOTE — ED Provider Notes (Signed)
[facility] Provider Note   CSN: [id number] Arrival date & time: 05/08/16  1315  By signing my name below, I, Brittney [name], attest that this documentation has been prepared under the direction and in the presence of Rolan Bucco, MD. Electronically Signed: Modena [name], Scribe. 05/08/2016. 3:15 PM.  History   Chief Complaint Chief Complaint  Patient presents with  . Foot Pain   The history is provided by the patient. No language interpreter was used.   HPI Comments: Brittney [name] is a 71 y.o. female with a PMHx of DM who presents to the Emergency Department complaining of constant moderate right foot pain that started about 1 week ago. She accidentally stepped on a nail while ambulating. She reports associated right foot wound with discoloration. Her tetanus status is not UTD. Denies any other complaints at this time.    PCP: Dennis Bast, MD  Past Medical History:  Diagnosis Date  . Arthritis   . Coronary artery disease   . Diabetes mellitus without complication (HCC)   . Heart attack   . Hypertension   . Stroke [facility])     Patient Active Problem List   Diagnosis Date Noted  . Allergic reaction 02/27/2013    Past Surgical History:  Procedure Laterality Date  . CATARACT EXTRACTION    . CESAREAN SECTION    . CORONARY ANGIOPLASTY WITH STENT PLACEMENT      OB History    No data available       Home Medications    Prior to Admission medications   Medication Sig Start Date End Date Taking? Authorizing Provider  amLODipine (NORVASC) 10 MG tablet Take 10 mg by mouth daily.    Historical Provider, MD  aspirin 81 MG tablet Take 81 mg by mouth daily.    Historical Provider, MD  atorvastatin (LIPITOR) 40 MG tablet Take 40 mg by mouth daily.    Historical Provider, MD  cephALEXin (KEFLEX) 500 MG capsule Take 1 capsule (500 mg total) by mouth 3 (three) times daily. 12/26/14   Renne Crigler, PA-C  ciprofloxacin (CIPRO) 500 MG tablet Take 1 tablet (500 mg total)  by mouth 2 (two) times daily. One po bid x 7 days 05/08/16   Rolan Bucco, MD  clopidogrel (PLAVIX) 75 MG tablet Take 75 mg by mouth daily.    Historical Provider, MD  doxycycline (VIBRAMYCIN) 100 MG capsule Take 1 capsule (100 mg total) by mouth 2 (two) times daily. 02/23/13   Vanetta Mulders, MD  enalapril (VASOTEC) 20 MG tablet Take 20 mg by mouth daily.    Historical Provider, MD  famotidine (PEPCID) 20 MG tablet Take 1 tablet (20 mg total) by mouth 2 (two) times daily. 02/27/13   Purvis Sheffield, MD  furosemide (LASIX) 40 MG tablet Take 40 mg by mouth daily.    Historical Provider, MD  gabapentin (NEURONTIN) 300 MG capsule Take 300 mg by mouth 3 (three) times daily.    Historical Provider, MD  hydrALAZINE (APRESOLINE) 10 MG tablet Take 10 mg by mouth 3 (three) times daily.    Historical Provider, MD  Insulin Aspart (NOVOLOG FLEXPEN [place]) Inject into the skin.    Historical Provider, MD  insulin glargine (LANTUS) 100 UNIT/ML injection Inject 62 Units into the skin at bedtime.    Historical Provider, MD  meclizine (ANTIVERT) 25 MG tablet Take 1 tablet (25 mg total) by mouth 4 (four) times daily. 12/31/11   Carleene Cooper, MD  metFORMIN (GLUCOPHAGE) 1000 MG tablet Take 1,000 mg by mouth 2 (  two) times daily with a meal.    Historical Provider, MD  metoprolol succinate (TOPROL-XL) 100 MG 24 hr tablet Take 100 mg by mouth daily. Take with or immediately following a meal.    Historical Provider, MD  nitrofurantoin, macrocrystal-monohydrate, (MACROBID) 100 MG capsule Take 1 capsule (100 mg total) by mouth 2 (two) times daily. 12/31/11   Carleene Cooper, MD  OXYBUTYNIN CHLORIDE ER PO Take 10 mg by mouth daily.    Historical Provider, MD  predniSONE (DELTASONE) 50 MG tablet Take 1 tablet by mouth daily for the next 4 days. 02/27/13   Purvis Sheffield, MD  traMADol (ULTRAM) 50 MG tablet Take 1 tablet (50 mg total) by mouth every 6 (six) hours as needed. 02/23/13   Vanetta Mulders, MD    Family History No family  history on file.  Social History Social History  Substance Use Topics  . Smoking status: Never Smoker  . Smokeless tobacco: Never Used  . Alcohol use No     Allergies   Iodinated diagnostic agents and Shellfish allergy   Review of Systems Review of Systems  Constitutional: Negative for fever.  Gastrointestinal: Negative for nausea and vomiting.  Musculoskeletal: Positive for joint swelling and myalgias (Right foot). Negative for back pain and neck pain.  Skin: Positive for pallor and wound (Right foot).  Neurological: Negative for weakness, numbness and headaches.     Physical Exam Updated Vital Signs BP (!) 177/66 (BP Location: Left Arm)   Pulse 92   Temp 98.5 F (36.9 C) (Oral)   Resp 16   Ht 5' 7.5" (1.715 m)   Wt 229 lb (103.9 kg)   SpO2 100%   BMI 35.34 kg/m   Physical Exam  Constitutional: She is oriented to person, place, and time. She appears well-developed and well-nourished.  HENT:  Head: Normocephalic and atraumatic.  Neck: Normal range of motion. Neck supple.  Cardiovascular: Normal rate.   Pulmonary/Chest: Effort normal.  Musculoskeletal: She exhibits edema and tenderness.  Neurological: She is alert and oriented to person, place, and time.  Skin: Skin is warm and dry.  Small puncture wound on the underside of the foot in the webbing between the 2nd and 3rd toes. Local swelling and discoloration. No warmth or erythema to foot.   Psychiatric: She has a normal mood and affect.       ED Treatments / Results  DIAGNOSTIC STUDIES: Oxygen Saturation is 100% on RA, normal by my interpretation.    COORDINATION OF CARE: 3:20 PM- Pt advised of plan for treatment and pt agrees.  Labs (all labs ordered are listed, but only abnormal results are displayed) Labs Reviewed - No data to display  EKG  EKG Interpretation None       Radiology Dg Foot Complete Right  Result Date: 05/08/2016 CLINICAL DATA:  RIGHT foot pain began 1 week ago. Stepped on a  nail. EXAM: RIGHT FOOT COMPLETE - 3+ VIEW COMPARISON:  None. FINDINGS: There is no evidence of fracture or dislocation. There is no evidence of erosive arthropathy or other focal bone abnormality. Soft tissues are unremarkable. No radiopaque foreign body. Degenerative change most pronounced at the first MTP joint. IMPRESSION: No findings of radiopaque foreign body, fracture, or features suggestive of soft tissue infection or osteomyelitis. Electronically Signed   By: Elsie Stain M.D.   On: 05/08/2016 15:49    Procedures Procedures (including critical care time)  Medications Ordered in ED Medications  Tdap (BOOSTRIX) injection 0.5 mL (0.5 mLs Intramuscular Given 05/08/16 1553)  Initial Impression / Assessment and Plan / ED Course  I have reviewed the triage vital signs and the nursing notes.  Pertinent labs & imaging results that were available during my care of the patient were reviewed by me and considered in my medical decision making (see chart for details).    Patient is a 71 year old with a puncture wound to the bottom of her foot. She is diabetic. There is no palpable abscess. No drainage. There is minimal surrounding erythema. I will start her on Cipro. There is no foreign bodies noted on x-ray. Her tetanus shot was updated today. I did review her records and saw that she saw her PCP earlier today which the patient did not tell me. On asking patient, she states that she was given an appointment next week to follow-up with the wound clinic but she felt like she needed to be seen sooner and came here instead. She canceled her appointment with the wound clinic. I advised her that we can start her on some antibiotics but she still needs to follow-up with the wound clinic. I encouraged her to call her PCP and get the appointment re instated at the wound clinic. Return precautions were given.  Final Clinical Impressions(s) / ED Diagnoses   Final diagnoses:  Puncture wound to foot, right,  initial encounter    New Prescriptions New Prescriptions   CIPROFLOXACIN (CIPRO) 500 MG TABLET    Take 1 tablet (500 mg total) by mouth 2 (two) times daily. One po bid x 7 days   I personally performed the services described in this documentation, which was scribed in my presence.  The recorded information has been reviewed and considered.     Rolan Bucco, MD 05/08/16 [phone]

## 2018-08-26 DIAGNOSIS — L97522 Non-pressure chronic ulcer of other part of left foot with fat layer exposed: Secondary | ICD-10-CM | POA: Insufficient documentation

## 2018-08-26 DIAGNOSIS — E11621 Type 2 diabetes mellitus with foot ulcer: Secondary | ICD-10-CM | POA: Insufficient documentation

## 2018-09-03 DIAGNOSIS — I700 Atherosclerosis of aorta: Secondary | ICD-10-CM | POA: Insufficient documentation

## 2018-10-11 ENCOUNTER — Encounter: Payer: Self-pay | Admitting: *Deleted

## 2018-10-11 ENCOUNTER — Ambulatory Visit (INDEPENDENT_AMBULATORY_CARE_PROVIDER_SITE_OTHER): Payer: Medicare Other | Admitting: Vascular Surgery

## 2018-10-11 ENCOUNTER — Encounter: Payer: Self-pay | Admitting: Vascular Surgery

## 2018-10-11 ENCOUNTER — Other Ambulatory Visit: Payer: Self-pay

## 2018-10-11 VITALS — BP 126/61 | HR 65 | Temp 97.3°F | Resp 20 | Ht 67.5 in | Wt 221.3 lb

## 2018-10-11 DIAGNOSIS — I739 Peripheral vascular disease, unspecified: Secondary | ICD-10-CM

## 2018-10-11 DIAGNOSIS — I70209 Unspecified atherosclerosis of native arteries of extremities, unspecified extremity: Secondary | ICD-10-CM | POA: Insufficient documentation

## 2018-10-11 DIAGNOSIS — E538 Deficiency of other specified B group vitamins: Secondary | ICD-10-CM | POA: Insufficient documentation

## 2018-10-11 DIAGNOSIS — R413 Other amnesia: Secondary | ICD-10-CM | POA: Insufficient documentation

## 2018-10-11 DIAGNOSIS — D72829 Elevated white blood cell count, unspecified: Secondary | ICD-10-CM | POA: Insufficient documentation

## 2018-10-11 DIAGNOSIS — Z794 Long term (current) use of insulin: Secondary | ICD-10-CM | POA: Insufficient documentation

## 2018-10-11 DIAGNOSIS — G5600 Carpal tunnel syndrome, unspecified upper limb: Secondary | ICD-10-CM | POA: Insufficient documentation

## 2018-10-11 NOTE — Progress Notes (Signed)
Patient ID: Scottlynn Lindell, female   DOB: [birth year], 73 y.o.   MRN: [id number]  Reason for Consult: New Patient (Initial Visit)   Referred by Kristopher Glee., MD  Subjective:     HPI:  Farhana Fellows is a 73 y.o. female history of hyperlipidemia hypertension and diabetes.  She has multiple family members with previous amputations this has her quite worried.  She does have left second toe ulceration.  She states that she bumps her toes frequently more recently has been wearing shoes to protect her feet.  She is unsure how long the ulcer on the left second toe is been present but her granddaughter pointed this out to her.  She does not have any pain in the foot right now.  She has been on antibiotics.  Currently no fevers.  Does have some drainage from the toe.  To her knowledge has never had lower extremity interventions in the past.  Past Medical History:  Diagnosis Date  . Arthritis   . Coronary artery disease   . Diabetes mellitus without complication ([place])   . DVT (deep venous thrombosis) ([place])   . Heart attack ([place])   . Hyperlipidemia   . Hypertension   . Stroke [facility])    No family history on file. Past Surgical History:  Procedure Laterality Date  . CATARACT EXTRACTION    . CESAREAN SECTION    . CORONARY ANGIOPLASTY WITH STENT PLACEMENT      Short Social History:  Social History   Tobacco Use  . Smoking status: Never Smoker  . Smokeless tobacco: Never Used  Substance Use Topics  . Alcohol use: No    Allergies  Allergen Reactions  . Iodinated Diagnostic Agents Nausea And Vomiting  . Shellfish Allergy Nausea And Vomiting    Current Outpatient Medications  Medication Sig Dispense Refill  . amLODipine (NORVASC) 10 MG tablet Take 10 mg by mouth daily.    Marland Kitchen aspirin 81 MG tablet Take 81 mg by mouth daily.    Marland Kitchen atorvastatin (LIPITOR) 40 MG tablet Take 40 mg by mouth daily.    . clopidogrel (PLAVIX) 75 MG tablet Take 75 mg by mouth daily.    . enalapril (VASOTEC)  20 MG tablet Take 20 mg by mouth daily.    . furosemide (LASIX) 40 MG tablet Take 40 mg by mouth daily.    Marland Kitchen gabapentin (NEURONTIN) 300 MG capsule Take 300 mg by mouth 3 (three) times daily.    . hydrALAZINE (APRESOLINE) 10 MG tablet Take 10 mg by mouth 3 (three) times daily.    . metFORMIN (GLUCOPHAGE) 1000 MG tablet Take 1,000 mg by mouth 2 (two) times daily with a meal.    . metoprolol succinate (TOPROL-XL) 100 MG 24 hr tablet Take 100 mg by mouth daily. Take with or immediately following a meal.    . OXYBUTYNIN CHLORIDE ER PO Take 10 mg by mouth daily.    . silver sulfADIAZINE (SILVADENE) 1 % cream     . Insulin Aspart (NOVOLOG FLEXPEN [place]) Inject into the skin.    Marland Kitchen insulin glargine (LANTUS) 100 UNIT/ML injection Inject 62 Units into the skin at bedtime.    . pioglitazone (ACTOS) 30 MG tablet TAKE 1 TABLET BY MOUTH  DAILY     No current facility-administered medications for this visit.     Review of Systems  Constitutional:  Constitutional negative. HENT: HENT negative.  Eyes: Eyes negative.  Respiratory: Respiratory negative.  Cardiovascular: Cardiovascular negative.  GI: Gastrointestinal negative.  Musculoskeletal: Musculoskeletal negative.  Skin: Positive for wound.  Neurological: Neurological negative. Hematologic: Hematologic/lymphatic negative.  Psychiatric: Psychiatric negative.        Objective:  Objective   Vitals:   10/11/18 0939  BP: 126/61  Pulse: 65  Resp: 20  Temp: (!) 97.3 F (36.3 C)  SpO2: 98%  Weight: 221 lb 4.8 oz (100.4 kg)  Height: 5' 7.5" (1.715 m)   Body mass index is 34.15 kg/m.  Physical Exam HENT:     Head: Normocephalic.     Mouth/Throat:     Mouth: Mucous membranes are moist.  Eyes:     Pupils: Pupils are equal, round, and reactive to light.  Neck:     Vascular: No carotid bruit.  Cardiovascular:     Rate and Rhythm: Regular rhythm.     Pulses:          Femoral pulses are 1+ on the right side and 1+ on the left side.       Popliteal pulses are 0 on the right side and 0 on the left side.     Heart sounds: Normal heart sounds.  Pulmonary:     Effort: Pulmonary effort is normal.     Breath sounds: Normal breath sounds. No wheezing.  Abdominal:     General: Abdomen is flat.     Palpations: Abdomen is soft.  Musculoskeletal: Normal range of motion.        General: No swelling or tenderness.     Comments: Left second toe has pinpoint distal ulceration with darkened skin discoloration of the entire toe  Skin:    General: Skin is warm and dry.     Capillary Refill: Capillary refill takes 2 to 3 seconds.  Neurological:     General: No focal deficit present.     Mental Status: She is alert.  Psychiatric:        Mood and Affect: Mood normal.        Behavior: Behavior normal.        Thought Content: Thought content normal.        Judgment: Judgment normal.     Data: I reviewed her ABIs from outside facility which demonstrates 0.6 in the right and 0.58 left     Assessment/Plan:     73 year old female with diabetes hypertension hyperlipidemia critical limb ischemia with wound on the left second toe.  I cannot reliably feel popliteal pulses bilaterally to suggest she might have SFA occlusive disease.  She also has weakened femoral pulses bilaterally although exam is somewhat limited.  Given diabetes hypertension hyperlipidemia may also have tibial disease.  I discussed with her proceeding with aortography with bilateral extremity evaluation possible invention on the left.  She demonstrates good understanding we will get her set up for the very near future.     Maeola HarmanBrandon Christopher Jennifer Holland MD [facility]

## 2018-10-26 ENCOUNTER — Other Ambulatory Visit (HOSPITAL_COMMUNITY)
Admission: RE | Admit: 2018-10-26 | Discharge: 2018-10-26 | Disposition: A | Discharge status: Home / Self Care | Payer: Medicare Other | Source: Ambulatory Visit | Attending: Vascular Surgery | Admitting: Vascular Surgery

## 2018-10-26 DIAGNOSIS — Z01812 Encounter for preprocedural laboratory examination: Secondary | ICD-10-CM | POA: Diagnosis present

## 2018-10-26 DIAGNOSIS — I739 Peripheral vascular disease, unspecified: Secondary | ICD-10-CM | POA: Diagnosis not present

## 2018-10-26 DIAGNOSIS — Z20828 Contact with and (suspected) exposure to other viral communicable diseases: Secondary | ICD-10-CM | POA: Insufficient documentation

## 2018-10-26 LAB — SARS CORONAVIRUS 2 (TAT 6-24 HRS): SARS Coronavirus 2: NEGATIVE

## 2018-10-28 ENCOUNTER — Other Ambulatory Visit: Payer: Self-pay

## 2018-10-28 ENCOUNTER — Ambulatory Visit (HOSPITAL_COMMUNITY)
Admission: RE | Admit: 2018-10-28 | Discharge: 2018-10-28 | Disposition: A | Discharge status: Home / Self Care | Payer: Medicare Other | Attending: Vascular Surgery | Admitting: Vascular Surgery

## 2018-10-28 ENCOUNTER — Encounter (HOSPITAL_COMMUNITY)
Admission: RE | Disposition: A | Discharge status: Home / Self Care | Payer: Self-pay | Source: Home / Self Care | Attending: Vascular Surgery

## 2018-10-28 DIAGNOSIS — M1990 Unspecified osteoarthritis, unspecified site: Secondary | ICD-10-CM | POA: Diagnosis not present

## 2018-10-28 DIAGNOSIS — Z7982 Long term (current) use of aspirin: Secondary | ICD-10-CM | POA: Diagnosis not present

## 2018-10-28 DIAGNOSIS — I2510 Atherosclerotic heart disease of native coronary artery without angina pectoris: Secondary | ICD-10-CM | POA: Diagnosis not present

## 2018-10-28 DIAGNOSIS — Z86718 Personal history of other venous thrombosis and embolism: Secondary | ICD-10-CM | POA: Diagnosis not present

## 2018-10-28 DIAGNOSIS — Z7902 Long term (current) use of antithrombotics/antiplatelets: Secondary | ICD-10-CM | POA: Diagnosis not present

## 2018-10-28 DIAGNOSIS — E785 Hyperlipidemia, unspecified: Secondary | ICD-10-CM | POA: Insufficient documentation

## 2018-10-28 DIAGNOSIS — Z794 Long term (current) use of insulin: Secondary | ICD-10-CM | POA: Insufficient documentation

## 2018-10-28 DIAGNOSIS — E11621 Type 2 diabetes mellitus with foot ulcer: Secondary | ICD-10-CM | POA: Insufficient documentation

## 2018-10-28 DIAGNOSIS — Z8673 Personal history of transient ischemic attack (TIA), and cerebral infarction without residual deficits: Secondary | ICD-10-CM | POA: Diagnosis not present

## 2018-10-28 DIAGNOSIS — I70245 Atherosclerosis of native arteries of left leg with ulceration of other part of foot: Secondary | ICD-10-CM | POA: Insufficient documentation

## 2018-10-28 DIAGNOSIS — L97529 Non-pressure chronic ulcer of other part of left foot with unspecified severity: Secondary | ICD-10-CM | POA: Insufficient documentation

## 2018-10-28 DIAGNOSIS — I998 Other disorder of circulatory system: Secondary | ICD-10-CM | POA: Diagnosis not present

## 2018-10-28 DIAGNOSIS — Z91041 Radiographic dye allergy status: Secondary | ICD-10-CM | POA: Diagnosis not present

## 2018-10-28 DIAGNOSIS — Z79899 Other long term (current) drug therapy: Secondary | ICD-10-CM | POA: Insufficient documentation

## 2018-10-28 DIAGNOSIS — I10 Essential (primary) hypertension: Secondary | ICD-10-CM | POA: Insufficient documentation

## 2018-10-28 HISTORY — PX: PERIPHERAL VASCULAR INTERVENTION: CATH118257

## 2018-10-28 HISTORY — PX: ABDOMINAL AORTOGRAM W/LOWER EXTREMITY: CATH118223

## 2018-10-28 LAB — POCT I-STAT, CHEM 8
BUN: 12 mg/dL (ref 8–23)
Calcium, Ion: 1.12 mmol/L — ABNORMAL LOW (ref 1.15–1.40)
Chloride: 103 mmol/L (ref 98–111)
Creatinine, Ser: 0.7 mg/dL (ref 0.44–1.00)
Glucose, Bld: 228 mg/dL — ABNORMAL HIGH (ref 70–99)
HCT: 34 % — ABNORMAL LOW (ref 36.0–46.0)
Hemoglobin: 11.6 g/dL — ABNORMAL LOW (ref 12.0–15.0)
Potassium: 3.5 mmol/L (ref 3.5–5.1)
Sodium: 139 mmol/L (ref 135–145)
TCO2: 24 mmol/L (ref 22–32)

## 2018-10-28 LAB — POCT ACTIVATED CLOTTING TIME: Activated Clotting Time: 274 seconds

## 2018-10-28 SURGERY — ABDOMINAL AORTOGRAM W/LOWER EXTREMITY
Anesthesia: LOCAL | Laterality: Bilateral

## 2018-10-28 MED ORDER — ACETAMINOPHEN 325 MG PO TABS: 650.0000 mg | ORAL_TABLET | ORAL | Status: DC | PRN

## 2018-10-28 MED ORDER — MIDAZOLAM HCL 2 MG/2ML IJ SOLN
INTRAMUSCULAR | Status: AC
  Filled 2018-10-28: qty 2

## 2018-10-28 MED ORDER — SODIUM CHLORIDE 0.9 % IV SOLN: INTRAVENOUS | Status: DC

## 2018-10-28 MED ORDER — SODIUM CHLORIDE 0.9% FLUSH: 3.0000 mL | Freq: Two times a day (BID) | INTRAVENOUS | Status: DC

## 2018-10-28 MED ORDER — HEPARIN SODIUM (PORCINE) 1000 UNIT/ML IJ SOLN
INTRAMUSCULAR | Status: DC | PRN
  Administered 2018-10-28 (×2): 2000 [IU] via INTRAVENOUS
  Administered 2018-10-28: 10000 [IU] via INTRAVENOUS

## 2018-10-28 MED ORDER — FENTANYL CITRATE (PF) 100 MCG/2ML IJ SOLN
INTRAMUSCULAR | Status: DC | PRN
  Administered 2018-10-28: 50 ug via INTRAVENOUS
  Administered 2018-10-28: 25 ug via INTRAVENOUS

## 2018-10-28 MED ORDER — DIPHENHYDRAMINE HCL 50 MG/ML IJ SOLN
INTRAMUSCULAR | Status: AC
  Administered 2018-10-28: 25 mg via INTRAVENOUS
  Filled 2018-10-28: qty 1

## 2018-10-28 MED ORDER — DIPHENHYDRAMINE HCL 50 MG/ML IJ SOLN
25.0000 mg | INTRAMUSCULAR | Status: AC
  Administered 2018-10-28: 13:00:00 25 mg via INTRAVENOUS

## 2018-10-28 MED ORDER — MIDAZOLAM HCL 2 MG/2ML IJ SOLN
INTRAMUSCULAR | Status: DC | PRN
  Administered 2018-10-28: 1 mg via INTRAVENOUS

## 2018-10-28 MED ORDER — HYDRALAZINE HCL 20 MG/ML IJ SOLN: 5.0000 mg | INTRAMUSCULAR | Status: DC | PRN

## 2018-10-28 MED ORDER — MORPHINE SULFATE (PF) 2 MG/ML IV SOLN
2.0000 mg | INTRAVENOUS | Status: DC | PRN
  Administered 2018-10-28: 2 mg via INTRAVENOUS
  Filled 2018-10-28: qty 1

## 2018-10-28 MED ORDER — LIDOCAINE HCL (PF) 1 % IJ SOLN
INTRAMUSCULAR | Status: AC
  Filled 2018-10-28: qty 30

## 2018-10-28 MED ORDER — LABETALOL HCL 5 MG/ML IV SOLN: 10.0000 mg | INTRAVENOUS | Status: DC | PRN

## 2018-10-28 MED ORDER — METHYLPREDNISOLONE SODIUM SUCC 125 MG IJ SOLR
125.0000 mg | INTRAMUSCULAR | Status: AC
  Administered 2018-10-28: 12:00:00 125 mg via INTRAVENOUS

## 2018-10-28 MED ORDER — HEPARIN (PORCINE) IN NACL 1000-0.9 UT/500ML-% IV SOLN
INTRAVENOUS | Status: AC
  Filled 2018-10-28: qty 1000

## 2018-10-28 MED ORDER — FENTANYL CITRATE (PF) 100 MCG/2ML IJ SOLN
INTRAMUSCULAR | Status: AC
  Filled 2018-10-28: qty 2

## 2018-10-28 MED ORDER — HEPARIN (PORCINE) IN NACL 1000-0.9 UT/500ML-% IV SOLN
INTRAVENOUS | Status: DC | PRN
  Administered 2018-10-28 (×2): 500 mL

## 2018-10-28 MED ORDER — SODIUM CHLORIDE 0.9 % IV SOLN: 250.0000 mL | INTRAVENOUS | Status: DC | PRN

## 2018-10-28 MED ORDER — HEPARIN SODIUM (PORCINE) 1000 UNIT/ML IJ SOLN
INTRAMUSCULAR | Status: AC
  Filled 2018-10-28: qty 1

## 2018-10-28 MED ORDER — LIDOCAINE HCL (PF) 1 % IJ SOLN
INTRAMUSCULAR | Status: DC | PRN
  Administered 2018-10-28: 5 mL
  Administered 2018-10-28: 20 mL

## 2018-10-28 MED ORDER — SODIUM CHLORIDE 0.9 % IV SOLN
INTRAVENOUS | Status: DC
  Administered 2018-10-28: 11:00:00 via INTRAVENOUS

## 2018-10-28 MED ORDER — OXYCODONE HCL 5 MG PO TABS: 5.0000 mg | ORAL_TABLET | ORAL | Status: DC | PRN

## 2018-10-28 MED ORDER — SODIUM CHLORIDE 0.9% FLUSH: 3.0000 mL | INTRAVENOUS | Status: DC | PRN

## 2018-10-28 MED ORDER — METHYLPREDNISOLONE SODIUM SUCC 125 MG IJ SOLR
INTRAMUSCULAR | Status: AC
  Administered 2018-10-28: 125 mg via INTRAVENOUS
  Filled 2018-10-28: qty 2

## 2018-10-28 MED ORDER — ONDANSETRON HCL 4 MG/2ML IJ SOLN: 4.0000 mg | Freq: Four times a day (QID) | INTRAMUSCULAR | Status: DC | PRN

## 2018-10-28 SURGICAL SUPPLY — 39 items
BALLN COYOTE OTW 2X220X150 (BALLOONS) ×3
BALLN STERLING OTW 2X220X150 (BALLOONS) ×3
BALLN STERLING OTW 3X220X150 (BALLOONS) ×3
BALLN STERLING OTW 5X220X150 (BALLOONS) ×3
BALLOON COYOTE OTW 2X220X150 (BALLOONS) ×2 IMPLANT
BALLOON STERLING OTW 2X220X150 (BALLOONS) ×2 IMPLANT
BALLOON STERLING OTW 3X220X150 (BALLOONS) ×2 IMPLANT
BALLOON STERLING OTW 5X220X150 (BALLOONS) ×2 IMPLANT
CATH CXI 2.6F 65 ST (CATHETERS) ×1
CATH CXI SUPP 2.6F 150 ST (CATHETERS) ×3 IMPLANT
CATH MUSTANG 3X40X135 (BALLOONS) ×3 IMPLANT
CATH OMNI FLUSH 5F 65CM (CATHETERS) ×3 IMPLANT
CATH QUICKCROSS SUPP .035X90CM (MICROCATHETER) ×3 IMPLANT
CATH SPRT STRG 65X2.6FR ACPT (CATHETERS) ×2 IMPLANT
CLOSURE MYNX CONTROL 6F/7F (Vascular Products) ×3 IMPLANT
DEVICE ONE SNARE 10MM (MISCELLANEOUS) ×3 IMPLANT
DEVICE TORQUE .025-.038 (MISCELLANEOUS) ×3 IMPLANT
GLIDEWIRE ADV .035X260CM (WIRE) ×3 IMPLANT
GLIDEWIRE ANGLED NITR .018X260 (WIRE) ×3 IMPLANT
HOVERMATT SINGLE USE (MISCELLANEOUS) ×3 IMPLANT
KIT ENCORE 26 ADVANTAGE (KITS) ×3 IMPLANT
KIT MICROPUNCTURE NIT STIFF (SHEATH) ×3 IMPLANT
KIT PV (KITS) ×3 IMPLANT
PATCH THROMBIX TOPICAL PLAIN (HEMOSTASIS) ×3 IMPLANT
SHEATH FLEX ANSEL ST 6FR 45CM (SHEATH) ×3 IMPLANT
SHEATH MICROPUNCTURE PEDAL 4FR (SHEATH) ×3 IMPLANT
SHEATH PINNACLE 5F 10CM (SHEATH) ×3 IMPLANT
SHEATH PINNACLE 6F 10CM (SHEATH) ×3 IMPLANT
SHEATH PROBE COVER 6X72 (BAG) ×6 IMPLANT
STENT SYNERGY DES 3X38 (Permanent Stent) ×3 IMPLANT
STENT VIABAHN 5X250X120 (Permanent Stent) ×3 IMPLANT
STOPCOCK MORSE 400PSI 3WAY (MISCELLANEOUS) ×3 IMPLANT
SYR MEDRAD MARK 7 150ML (SYRINGE) ×3 IMPLANT
TRANSDUCER W/STOPCOCK (MISCELLANEOUS) ×3 IMPLANT
TRAY PV CATH (CUSTOM PROCEDURE TRAY) ×3 IMPLANT
TUBING HIGH PRESSURE 120CM (CONNECTOR) ×3 IMPLANT
WIRE BENTSON .035X145CM (WIRE) ×3 IMPLANT
WIRE G V18X300CM (WIRE) ×15 IMPLANT
WIRE SPARTACORE .014X300CM (WIRE) ×3 IMPLANT

## 2018-10-28 NOTE — Op Note (Signed)
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 Donzetta Matters, MD Procedure Performed: 1.  Ultrasound-guided cannulation right common femoral artery 2.  Aortogram with bilateral lower extremity runoff 3.  Ultrasound-guided cannulation left posterior tibial artery 4.  Stent of left SFA with 5 x 250 mm viabahn 5.  Left tibioperoneal trunk stent with 3 x 38 mm Synergy 6.  Balloon angioplasty of left posterior tibial artery with 3 mm balloon 7.  Minx device closure right common femoral artery 8.  Moderate sedation with fentanyl and Versed for 182 minutes  Indications: 73 year old female with critical left lower extremity ischemia with severely depressed ABIs.  Patient does not have popliteal pulses bilaterally.  She is indicated for angiography possible intervention.  Findings: Aorta and iliac segments are free of flow-limiting stenosis.  Bilaterally the SFAs are subtotally occluded proximally.  It is difficult to evaluate the tibial arteries on the right side given contrast timing and proximal occlusion.  Left SFA is very diminutive proximal occludes distally reconstitutes popliteal artery.  The tibioperoneal trunk was then occluded reconstituted would only appear to be distal posterior tibial artery which was healthy on of the foot and the peroneal and anterior tibial artery which did not extend onto the foot.  After intervention left SFA which previously occluded now has no further flow-limiting stenosis no dissection.  After balloon angioplasty posterior tibial artery there was dissection proximally the TP trunk this was stented with synergy coronary balloon expandable stent.  No flow limiting stenosis or dissection was then noted all the way to the level of the foot.   Procedure:  The patient was identified in the holding area  and taken to room 8.  The patient was then placed supine on the table and prepped and draped in the usual sterile fashion.  A time out was called.  Ultrasound was used to evaluate the right common femoral artery which was noted be patent.  There is anesthetized 1% lidocaine cannulated micropuncture needle followed by wire and sheath.  This was done direct ultrasound visualization images saved the permanent record.  5 French sheath was placed over Bentson wire.  Aortogram was performed followed by bilateral lower extremity runoff.  We crossed the bifurcation Glidewire advantage placed a long 6 French sheath the left common femoral artery.  Patient was fully heparinized.  We able to cross the level to popliteal artery with a Glidewire advantage quick cross catheter.  We performed tibial angiography given tibials were previously underrepresented.  There was no takeoff the tibial arteries proximally.  I then used ultrasound to identify the posterior tibial artery which was calcified with patent.  Area was anesthetized 1% lidocaine cannulated micropuncture needle followed the wire and sheath.  And images saved the permanent record.  We placed a 4 French micro sheath.  We used a V 18 wire from below with CXI catheter.  We were able to get into a dissection plane.  We then balloon from below with 2 mm balloon after being unable to snare from above.  We then able to cross from above with V 18 wire confirmed intraluminal access.  We did snare this pulled through and through access.  We then balloon the entire left lower extremity from SFA to posterior tibial artery 3 mm balloon.  We then stented the SFA from the takeoff all the way to the adductor hiatus with  5 x 250 mm covered stent and endoluminal bypass configuration.  This was postdilated with a 5 mm balloon.  Angiography then demonstrated what appeared to be dissection that was flow-limiting at the TP trunk.  This was stented after exchanging for 014 wire.  We stented  with coronary balloon expandable stent 3 mm x 38 mm.  Completion demonstrated inline flow all the way to the foot via the posterior tibial artery.  Satisfied with this we exchanged for short 6 French sheath deployed a minx device.  She tolerated procedure without any complication   Contrast: 215cc   Joevon Holliman C. Randie Heinz, MD [facility] [place] Office: [phone] Pager: [phone]

## 2018-10-28 NOTE — Discharge Instructions (Signed)
Femoral Site Care This sheet gives you information about how to care for yourself after your procedure. Your health care provider may also give you more specific instructions. If you have problems or questions, contact your health care provider. What can I expect after the procedure? After the procedure, it is common to have:  Bruising that usually fades within 1-2 weeks.  Tenderness at the site. Follow these instructions at home: Wound care  Follow instructions from your health care provider about how to take care of your insertion site. Make sure you: ? Wash your hands with soap and water before you change your bandage (dressing). If soap and water are not available, use hand sanitizer. ? Change your dressing as told by your health care provider. ? Leave stitches (sutures), skin glue, or adhesive strips in place. These skin closures may need to stay in place for 2 weeks or longer. If adhesive strip edges start to loosen and curl up, you may trim the loose edges. Do not remove adhesive strips completely unless your health care provider tells you to do that.  Do not take baths, swim, or use a hot tub until your health care provider approves.  You may shower 24-48 hours after the procedure or as told by your health care provider. ? Gently wash the site with plain soap and water. ? Pat the area dry with a clean towel. ? Do not rub the site. This may cause bleeding.  Do not apply powder or lotion to the site. Keep the site clean and dry.  Check your femoral site every day for signs of infection. Check for: ? Redness, swelling, or pain. ? Fluid or blood. ? Warmth. ? Pus or a bad smell. Activity  For the first 2-3 days after your procedure, or as long as directed: ? Avoid climbing stairs as much as possible. ? Do not squat.  Do not lift anything that is heavier than 10 lb (4.5 kg), or the limit that you are told, until your health care provider says that it is safe.  Rest as  directed. ? Avoid sitting for a long time without moving. Get up to take short walks every 1-2 hours.  Do not drive for 48 hours. General instructions  Take over-the-counter and prescription medicines only as told by your health care provider.  Keep all follow-up visits as told by your health care provider. This is important. Contact a health care provider if you have:  A fever or chills.  You have redness, swelling, or pain around your insertion site. Get help right away if:  The catheter insertion area swells very fast.  You pass out.  You suddenly start to sweat or your skin gets clammy.  The catheter insertion area is bleeding, and the bleeding does not stop when you hold steady pressure on the area.  The area near or just beyond the catheter insertion site becomes pale, cool, tingly, or numb. These symptoms may represent a serious problem that is an emergency. Do not wait to see if the symptoms will go away. Get medical help right away. Call your local emergency services (911 in the [place] Do not drive yourself to the hospital. Summary  After the procedure, it is common to have bruising that usually fades within 1-2 weeks.  Check your femoral site every day for signs of infection.  Do not lift anything that is heavier than 10 lb (4.5 kg), or the limit that you are told, until your health care provider says  that it is safe. This information is not intended to replace advice given to you by your health care provider. Make sure you discuss any questions you have with your health care provider. Document Released: 09/19/2013 Document Revised: 01/29/2017 Document Reviewed: 01/29/2017 Elsevier Patient Education  2020 [employer].

## 2018-10-28 NOTE — H&P (Signed)
   History and Physical Update  The patient was interviewed and re-examined.  The patient's previous History and Physical has been reviewed and is unchanged from recent office visit. Plan for aortogram with bilateral runoff and possible intervention on the left.   Lue Sykora C. Donzetta Matters, MD [facility] [place] Office: [phone] Pager: [phone]  10/28/2018, 11:09 AM

## 2018-10-29 ENCOUNTER — Encounter (HOSPITAL_COMMUNITY): Payer: Self-pay | Admitting: Vascular Surgery

## 2018-11-20 ENCOUNTER — Other Ambulatory Visit: Payer: Self-pay

## 2018-11-20 DIAGNOSIS — I739 Peripheral vascular disease, unspecified: Secondary | ICD-10-CM

## 2018-11-22 ENCOUNTER — Ambulatory Visit (HOSPITAL_COMMUNITY): Admission: RE | Admit: 2018-11-22 | Payer: Medicare Other | Source: Ambulatory Visit

## 2018-11-22 ENCOUNTER — Encounter (HOSPITAL_COMMUNITY): Payer: Medicare Other

## 2020-11-27 ENCOUNTER — Emergency Department (HOSPITAL_BASED_OUTPATIENT_CLINIC_OR_DEPARTMENT_OTHER): Payer: Medicare Other

## 2020-11-27 ENCOUNTER — Other Ambulatory Visit: Payer: Self-pay

## 2020-11-27 ENCOUNTER — Inpatient Hospital Stay (HOSPITAL_BASED_OUTPATIENT_CLINIC_OR_DEPARTMENT_OTHER)
Admission: EM | Admit: 2020-11-27 | Discharge: 2020-12-09 | DRG: 280 | Disposition: A | Discharge status: Skilled Nursing Facility | Payer: Medicare Other | Attending: Internal Medicine | Admitting: Internal Medicine

## 2020-11-27 ENCOUNTER — Encounter (HOSPITAL_COMMUNITY)
Admission: EM | Disposition: A | Discharge status: Skilled Nursing Facility | Payer: Self-pay | Source: Home / Self Care | Attending: Internal Medicine

## 2020-11-27 ENCOUNTER — Encounter (HOSPITAL_BASED_OUTPATIENT_CLINIC_OR_DEPARTMENT_OTHER): Payer: Self-pay

## 2020-11-27 DIAGNOSIS — R29700 NIHSS score 0: Secondary | ICD-10-CM | POA: Diagnosis not present

## 2020-11-27 DIAGNOSIS — I340 Nonrheumatic mitral (valve) insufficiency: Secondary | ICD-10-CM | POA: Diagnosis not present

## 2020-11-27 DIAGNOSIS — Q2112 Patent foramen ovale: Secondary | ICD-10-CM | POA: Diagnosis not present

## 2020-11-27 DIAGNOSIS — M25571 Pain in right ankle and joints of right foot: Secondary | ICD-10-CM | POA: Diagnosis present

## 2020-11-27 DIAGNOSIS — Z66 Do not resuscitate: Secondary | ICD-10-CM | POA: Diagnosis not present

## 2020-11-27 DIAGNOSIS — I9589 Other hypotension: Secondary | ICD-10-CM | POA: Diagnosis not present

## 2020-11-27 DIAGNOSIS — I252 Old myocardial infarction: Secondary | ICD-10-CM | POA: Diagnosis not present

## 2020-11-27 DIAGNOSIS — E669 Obesity, unspecified: Secondary | ICD-10-CM | POA: Diagnosis present

## 2020-11-27 DIAGNOSIS — Z8673 Personal history of transient ischemic attack (TIA), and cerebral infarction without residual deficits: Secondary | ICD-10-CM | POA: Diagnosis not present

## 2020-11-27 DIAGNOSIS — F8081 Childhood onset fluency disorder: Secondary | ICD-10-CM | POA: Diagnosis present

## 2020-11-27 DIAGNOSIS — I10 Essential (primary) hypertension: Secondary | ICD-10-CM | POA: Diagnosis present

## 2020-11-27 DIAGNOSIS — Z7189 Other specified counseling: Secondary | ICD-10-CM | POA: Diagnosis not present

## 2020-11-27 DIAGNOSIS — Z91041 Radiographic dye allergy status: Secondary | ICD-10-CM | POA: Diagnosis not present

## 2020-11-27 DIAGNOSIS — I5021 Acute systolic (congestive) heart failure: Secondary | ICD-10-CM | POA: Diagnosis not present

## 2020-11-27 DIAGNOSIS — Z20822 Contact with and (suspected) exposure to covid-19: Secondary | ICD-10-CM | POA: Diagnosis present

## 2020-11-27 DIAGNOSIS — I110 Hypertensive heart disease with heart failure: Secondary | ICD-10-CM | POA: Diagnosis present

## 2020-11-27 DIAGNOSIS — E1151 Type 2 diabetes mellitus with diabetic peripheral angiopathy without gangrene: Secondary | ICD-10-CM | POA: Diagnosis present

## 2020-11-27 DIAGNOSIS — I70209 Unspecified atherosclerosis of native arteries of extremities, unspecified extremity: Secondary | ICD-10-CM | POA: Diagnosis present

## 2020-11-27 DIAGNOSIS — M1990 Unspecified osteoarthritis, unspecified site: Secondary | ICD-10-CM | POA: Diagnosis present

## 2020-11-27 DIAGNOSIS — Z6835 Body mass index (BMI) 35.0-35.9, adult: Secondary | ICD-10-CM

## 2020-11-27 DIAGNOSIS — Z7982 Long term (current) use of aspirin: Secondary | ICD-10-CM

## 2020-11-27 DIAGNOSIS — I5043 Acute on chronic combined systolic (congestive) and diastolic (congestive) heart failure: Secondary | ICD-10-CM | POA: Diagnosis not present

## 2020-11-27 DIAGNOSIS — Z7984 Long term (current) use of oral hypoglycemic drugs: Secondary | ICD-10-CM

## 2020-11-27 DIAGNOSIS — I2510 Atherosclerotic heart disease of native coronary artery without angina pectoris: Secondary | ICD-10-CM | POA: Diagnosis present

## 2020-11-27 DIAGNOSIS — I63442 Cerebral infarction due to embolism of left cerebellar artery: Secondary | ICD-10-CM | POA: Diagnosis not present

## 2020-11-27 DIAGNOSIS — Y658 Other specified misadventures during surgical and medical care: Secondary | ICD-10-CM | POA: Diagnosis not present

## 2020-11-27 DIAGNOSIS — Z79899 Other long term (current) drug therapy: Secondary | ICD-10-CM

## 2020-11-27 DIAGNOSIS — Z955 Presence of coronary angioplasty implant and graft: Secondary | ICD-10-CM

## 2020-11-27 DIAGNOSIS — Z95820 Peripheral vascular angioplasty status with implants and grafts: Secondary | ICD-10-CM | POA: Diagnosis not present

## 2020-11-27 DIAGNOSIS — I97820 Postprocedural cerebrovascular infarction during cardiac surgery: Secondary | ICD-10-CM | POA: Diagnosis not present

## 2020-11-27 DIAGNOSIS — E785 Hyperlipidemia, unspecified: Secondary | ICD-10-CM | POA: Diagnosis present

## 2020-11-27 DIAGNOSIS — I214 Non-ST elevation (NSTEMI) myocardial infarction: Principal | ICD-10-CM | POA: Diagnosis present

## 2020-11-27 DIAGNOSIS — I5023 Acute on chronic systolic (congestive) heart failure: Secondary | ICD-10-CM | POA: Diagnosis present

## 2020-11-27 DIAGNOSIS — I213 ST elevation (STEMI) myocardial infarction of unspecified site: Secondary | ICD-10-CM | POA: Diagnosis not present

## 2020-11-27 DIAGNOSIS — Z86718 Personal history of other venous thrombosis and embolism: Secondary | ICD-10-CM

## 2020-11-27 DIAGNOSIS — R54 Age-related physical debility: Secondary | ICD-10-CM | POA: Diagnosis present

## 2020-11-27 DIAGNOSIS — Z91013 Allergy to seafood: Secondary | ICD-10-CM | POA: Diagnosis not present

## 2020-11-27 DIAGNOSIS — Z794 Long term (current) use of insulin: Secondary | ICD-10-CM

## 2020-11-27 DIAGNOSIS — I509 Heart failure, unspecified: Secondary | ICD-10-CM | POA: Diagnosis not present

## 2020-11-27 DIAGNOSIS — T7840XA Allergy, unspecified, initial encounter: Secondary | ICD-10-CM | POA: Diagnosis present

## 2020-11-27 DIAGNOSIS — E782 Mixed hyperlipidemia: Secondary | ICD-10-CM | POA: Diagnosis not present

## 2020-11-27 DIAGNOSIS — I639 Cerebral infarction, unspecified: Secondary | ICD-10-CM

## 2020-11-27 DIAGNOSIS — M109 Gout, unspecified: Secondary | ICD-10-CM | POA: Diagnosis present

## 2020-11-27 DIAGNOSIS — E11319 Type 2 diabetes mellitus with unspecified diabetic retinopathy without macular edema: Secondary | ICD-10-CM | POA: Diagnosis present

## 2020-11-27 DIAGNOSIS — D649 Anemia, unspecified: Secondary | ICD-10-CM | POA: Diagnosis present

## 2020-11-27 DIAGNOSIS — Z515 Encounter for palliative care: Secondary | ICD-10-CM | POA: Diagnosis not present

## 2020-11-27 DIAGNOSIS — Z7902 Long term (current) use of antithrombotics/antiplatelets: Secondary | ICD-10-CM

## 2020-11-27 DIAGNOSIS — E1140 Type 2 diabetes mellitus with diabetic neuropathy, unspecified: Secondary | ICD-10-CM | POA: Diagnosis present

## 2020-11-27 DIAGNOSIS — E119 Type 2 diabetes mellitus without complications: Secondary | ICD-10-CM | POA: Diagnosis not present

## 2020-11-27 HISTORY — PX: LEFT HEART CATH AND CORONARY ANGIOGRAPHY: CATH118249

## 2020-11-27 LAB — POCT ACTIVATED CLOTTING TIME: Activated Clotting Time: 294 seconds

## 2020-11-27 LAB — CBC WITH DIFFERENTIAL/PLATELET
Abs Immature Granulocytes: 0.02 10*3/uL (ref 0.00–0.07)
Basophils Absolute: 0.1 10*3/uL (ref 0.0–0.1)
Basophils Relative: 1 %
Eosinophils Absolute: 0.1 10*3/uL (ref 0.0–0.5)
Eosinophils Relative: 2 %
HCT: 30.2 % — ABNORMAL LOW (ref 36.0–46.0)
Hemoglobin: 9.6 g/dL — ABNORMAL LOW (ref 12.0–15.0)
Immature Granulocytes: 0 %
Lymphocytes Relative: 32 %
Lymphs Abs: 2.3 10*3/uL (ref 0.7–4.0)
MCH: 28.9 pg (ref 26.0–34.0)
MCHC: 31.8 g/dL (ref 30.0–36.0)
MCV: 91 fL (ref 80.0–100.0)
Monocytes Absolute: 0.4 10*3/uL (ref 0.1–1.0)
Monocytes Relative: 6 %
Neutro Abs: 4.2 10*3/uL (ref 1.7–7.7)
Neutrophils Relative %: 59 %
Platelets: 238 10*3/uL (ref 150–400)
RBC: 3.32 MIL/uL — ABNORMAL LOW (ref 3.87–5.11)
RDW: 15.6 % — ABNORMAL HIGH (ref 11.5–15.5)
WBC: 7.1 10*3/uL (ref 4.0–10.5)
nRBC: 0 % (ref 0.0–0.2)

## 2020-11-27 LAB — POCT I-STAT 7, (LYTES, BLD GAS, ICA,H+H)
Acid-base deficit: 2 mmol/L (ref 0.0–2.0)
Bicarbonate: 22 mmol/L (ref 20.0–28.0)
Calcium, Ion: 1.16 mmol/L (ref 1.15–1.40)
HCT: 27 % — ABNORMAL LOW (ref 36.0–46.0)
Hemoglobin: 9.2 g/dL — ABNORMAL LOW (ref 12.0–15.0)
O2 Saturation: 97 %
Potassium: 3.8 mmol/L (ref 3.5–5.1)
Sodium: 140 mmol/L (ref 135–145)
TCO2: 23 mmol/L (ref 22–32)
pCO2 arterial: 33.3 mmHg (ref 32.0–48.0)
pH, Arterial: 7.429 (ref 7.350–7.450)
pO2, Arterial: 91 mmHg (ref 83.0–108.0)

## 2020-11-27 LAB — COMPREHENSIVE METABOLIC PANEL
ALT: 21 U/L (ref 0–44)
AST: 23 U/L (ref 15–41)
Albumin: 3.5 g/dL (ref 3.5–5.0)
Alkaline Phosphatase: 44 U/L (ref 38–126)
Anion gap: 10 (ref 5–15)
BUN: 25 mg/dL — ABNORMAL HIGH (ref 8–23)
CO2: 24 mmol/L (ref 22–32)
Calcium: 9.2 mg/dL (ref 8.9–10.3)
Chloride: 103 mmol/L (ref 98–111)
Creatinine, Ser: 1.11 mg/dL — ABNORMAL HIGH (ref 0.44–1.00)
GFR, Estimated: 52 mL/min — ABNORMAL LOW (ref >60)
Glucose, Bld: 204 mg/dL — ABNORMAL HIGH (ref 70–99)
Potassium: 3.9 mmol/L (ref 3.5–5.1)
Sodium: 137 mmol/L (ref 135–145)
Total Bilirubin: 0.4 mg/dL (ref 0.3–1.2)
Total Protein: 7.9 g/dL (ref 6.5–8.1)

## 2020-11-27 LAB — RESP PANEL BY RT-PCR (FLU A&B, COVID) ARPGX2
Influenza A by PCR: NEGATIVE
Influenza B by PCR: NEGATIVE
SARS Coronavirus 2 by RT PCR: NEGATIVE

## 2020-11-27 LAB — PROTIME-INR
INR: 1.2 (ref 0.8–1.2)
Prothrombin Time: 15.1 seconds (ref 11.4–15.2)

## 2020-11-27 LAB — TROPONIN I (HIGH SENSITIVITY)
Troponin I (High Sensitivity): 148 ng/L (ref <18)
Troponin I (High Sensitivity): 1249 ng/L (ref <18)

## 2020-11-27 LAB — APTT: aPTT: >200 seconds (ref 24–36)

## 2020-11-27 SURGERY — LEFT HEART CATH AND CORONARY ANGIOGRAPHY
Anesthesia: LOCAL

## 2020-11-27 MED ORDER — HEPARIN (PORCINE) IN NACL 1000-0.9 UT/500ML-% IV SOLN
INTRAVENOUS | Status: AC
  Filled 2020-11-27: qty 500

## 2020-11-27 MED ORDER — SODIUM CHLORIDE 0.9 % IV SOLN: 250.0000 mL | INTRAVENOUS | Status: DC | PRN

## 2020-11-27 MED ORDER — HEPARIN (PORCINE) IN NACL 1000-0.9 UT/500ML-% IV SOLN
INTRAVENOUS | Status: DC | PRN
  Administered 2020-11-27 (×2): 500 mL

## 2020-11-27 MED ORDER — FUROSEMIDE 10 MG/ML IJ SOLN
INTRAMUSCULAR | Status: AC
  Filled 2020-11-27: qty 4

## 2020-11-27 MED ORDER — VERAPAMIL HCL 2.5 MG/ML IV SOLN
INTRAVENOUS | Status: AC
  Filled 2020-11-27: qty 2

## 2020-11-27 MED ORDER — ACETAMINOPHEN 325 MG PO TABS
650.0000 mg | ORAL_TABLET | ORAL | Status: DC | PRN
  Administered 2020-11-28 – 2020-12-05 (×2): 650 mg via ORAL
  Filled 2020-11-27 (×3): qty 2

## 2020-11-27 MED ORDER — ONDANSETRON HCL 4 MG/2ML IJ SOLN: 4.0000 mg | Freq: Four times a day (QID) | INTRAMUSCULAR | Status: DC | PRN

## 2020-11-27 MED ORDER — FENTANYL CITRATE (PF) 100 MCG/2ML IJ SOLN
INTRAMUSCULAR | Status: DC | PRN
  Administered 2020-11-27: 25 ug via INTRAVENOUS

## 2020-11-27 MED ORDER — METOPROLOL TARTRATE 5 MG/5ML IV SOLN
5.0000 mg | Freq: Once | INTRAVENOUS | Status: AC
  Administered 2020-11-27: 5 mg via INTRAVENOUS
  Filled 2020-11-27: qty 5

## 2020-11-27 MED ORDER — HEPARIN (PORCINE) IN NACL 2000-0.9 UNIT/L-% IV SOLN
INTRAVENOUS | Status: AC
  Filled 2020-11-27: qty 1000

## 2020-11-27 MED ORDER — HEPARIN SODIUM (PORCINE) 1000 UNIT/ML IJ SOLN
INTRAMUSCULAR | Status: DC | PRN
  Administered 2020-11-27 (×2): 5000 [IU] via INTRAVENOUS

## 2020-11-27 MED ORDER — FENTANYL CITRATE (PF) 100 MCG/2ML IJ SOLN
INTRAMUSCULAR | Status: AC
  Filled 2020-11-27: qty 2

## 2020-11-27 MED ORDER — HYDRALAZINE HCL 20 MG/ML IJ SOLN
10.0000 mg | INTRAMUSCULAR | Status: AC | PRN
Start: 2020-11-27 — End: 2020-11-28

## 2020-11-27 MED ORDER — LABETALOL HCL 5 MG/ML IV SOLN: 10.0000 mg | INTRAVENOUS | Status: AC | PRN

## 2020-11-27 MED ORDER — ASPIRIN 81 MG PO CHEW
324.0000 mg | CHEWABLE_TABLET | Freq: Once | ORAL | Status: AC
  Administered 2020-11-27: 324 mg via ORAL
  Filled 2020-11-27: qty 4

## 2020-11-27 MED ORDER — CLOPIDOGREL BISULFATE 75 MG PO TABS
75.0000 mg | ORAL_TABLET | Freq: Every day | ORAL | Status: DC
  Administered 2020-11-28 – 2020-12-08 (×10): 75 mg via ORAL
  Filled 2020-11-27 (×10): qty 1

## 2020-11-27 MED ORDER — IOHEXOL 350 MG/ML SOLN
INTRAVENOUS | Status: DC | PRN
  Administered 2020-11-27: 145 mL

## 2020-11-27 MED ORDER — SODIUM CHLORIDE 0.9 % IV SOLN: INTRAVENOUS | Status: DC

## 2020-11-27 MED ORDER — FUROSEMIDE 10 MG/ML IJ SOLN
INTRAMUSCULAR | Status: DC | PRN
  Administered 2020-11-27 (×2): 40 mg via INTRAVENOUS

## 2020-11-27 MED ORDER — HEPARIN (PORCINE) 25000 UT/250ML-% IV SOLN
1100.0000 [IU]/h | INTRAVENOUS | Status: DC
  Administered 2020-11-28 – 2020-11-29 (×2): 1150 [IU]/h via INTRAVENOUS
  Filled 2020-11-27 (×2): qty 250

## 2020-11-27 MED ORDER — HEPARIN (PORCINE) 25000 UT/250ML-% IV SOLN: 1000.0000 [IU]/h | INTRAVENOUS | Status: DC

## 2020-11-27 MED ORDER — ASPIRIN 81 MG PO CHEW
81.0000 mg | CHEWABLE_TABLET | Freq: Every day | ORAL | Status: DC
  Administered 2020-11-28 – 2020-12-09 (×12): 81 mg via ORAL
  Filled 2020-11-27 (×12): qty 1

## 2020-11-27 MED ORDER — HEPARIN SODIUM (PORCINE) 5000 UNIT/ML IJ SOLN
4000.0000 [IU] | Freq: Once | INTRAMUSCULAR | Status: AC
  Administered 2020-11-27: 4000 [IU] via INTRAVENOUS
  Filled 2020-11-27: qty 1

## 2020-11-27 MED ORDER — MIDAZOLAM HCL 2 MG/2ML IJ SOLN
INTRAMUSCULAR | Status: DC | PRN
  Administered 2020-11-27: 1 mg via INTRAVENOUS

## 2020-11-27 MED ORDER — VERAPAMIL HCL 2.5 MG/ML IV SOLN
INTRAVENOUS | Status: DC | PRN
  Administered 2020-11-27: 10 mL via INTRA_ARTERIAL

## 2020-11-27 MED ORDER — FUROSEMIDE 10 MG/ML IJ SOLN
80.0000 mg | Freq: Two times a day (BID) | INTRAMUSCULAR | Status: DC
  Administered 2020-11-28 – 2020-11-29 (×3): 80 mg via INTRAVENOUS
  Filled 2020-11-27 (×4): qty 8

## 2020-11-27 MED ORDER — MIDAZOLAM HCL 2 MG/2ML IJ SOLN
INTRAMUSCULAR | Status: AC
  Filled 2020-11-27: qty 2

## 2020-11-27 MED ORDER — INSULIN ASPART 100 UNIT/ML IJ SOLN
0.0000 [IU] | Freq: Three times a day (TID) | INTRAMUSCULAR | Status: DC
  Administered 2020-11-28: 5 [IU] via SUBCUTANEOUS
  Administered 2020-11-29: 2 [IU] via SUBCUTANEOUS
  Administered 2020-11-29 (×2): 3 [IU] via SUBCUTANEOUS
  Administered 2020-11-30: 2 [IU] via SUBCUTANEOUS
  Administered 2020-11-30: 5 [IU] via SUBCUTANEOUS
  Administered 2020-11-30 – 2020-12-01 (×2): 3 [IU] via SUBCUTANEOUS
  Administered 2020-12-01: 8 [IU] via SUBCUTANEOUS
  Administered 2020-12-01: 3 [IU] via SUBCUTANEOUS
  Administered 2020-12-02: 8 [IU] via SUBCUTANEOUS
  Administered 2020-12-02 (×2): 3 [IU] via SUBCUTANEOUS
  Administered 2020-12-03: 2 [IU] via SUBCUTANEOUS
  Administered 2020-12-03: 8 [IU] via SUBCUTANEOUS
  Administered 2020-12-03: 3 [IU] via SUBCUTANEOUS
  Administered 2020-12-04: 5 [IU] via SUBCUTANEOUS
  Administered 2020-12-04: 2 [IU] via SUBCUTANEOUS
  Administered 2020-12-04 – 2020-12-05 (×2): 5 [IU] via SUBCUTANEOUS
  Administered 2020-12-05: 2 [IU] via SUBCUTANEOUS
  Administered 2020-12-06 (×2): 3 [IU] via SUBCUTANEOUS
  Administered 2020-12-06 – 2020-12-07 (×3): 2 [IU] via SUBCUTANEOUS
  Administered 2020-12-07: 5 [IU] via SUBCUTANEOUS
  Administered 2020-12-08: 2 [IU] via SUBCUTANEOUS
  Administered 2020-12-08 (×2): 3 [IU] via SUBCUTANEOUS
  Administered 2020-12-09: 2 [IU] via SUBCUTANEOUS

## 2020-11-27 MED ORDER — HEPARIN SODIUM (PORCINE) 1000 UNIT/ML IJ SOLN
INTRAMUSCULAR | Status: AC
  Filled 2020-11-27: qty 1

## 2020-11-27 MED ORDER — SODIUM CHLORIDE 0.9% FLUSH
3.0000 mL | INTRAVENOUS | Status: DC | PRN
  Administered 2020-12-01: 3 mL via INTRAVENOUS

## 2020-11-27 MED ORDER — ATORVASTATIN CALCIUM 80 MG PO TABS
80.0000 mg | ORAL_TABLET | Freq: Every day | ORAL | Status: DC
  Administered 2020-11-28 – 2020-12-07 (×9): 80 mg via ORAL
  Filled 2020-11-27 (×9): qty 1

## 2020-11-27 MED ORDER — LIDOCAINE HCL (PF) 1 % IJ SOLN
INTRAMUSCULAR | Status: DC | PRN
  Administered 2020-11-27: 1 mL

## 2020-11-27 MED ORDER — SODIUM CHLORIDE 0.9% FLUSH
3.0000 mL | Freq: Two times a day (BID) | INTRAVENOUS | Status: DC
  Administered 2020-11-28 – 2020-12-04 (×11): 3 mL via INTRAVENOUS

## 2020-11-27 MED ORDER — INSULIN ASPART 100 UNIT/ML IJ SOLN
0.0000 [IU] | Freq: Every day | INTRAMUSCULAR | Status: DC
  Administered 2020-11-27: 2 [IU] via SUBCUTANEOUS
  Administered 2020-11-30 – 2020-12-01 (×2): 3 [IU] via SUBCUTANEOUS
  Administered 2020-12-02: 5 [IU] via SUBCUTANEOUS
  Administered 2020-12-04 – 2020-12-08 (×3): 2 [IU] via SUBCUTANEOUS

## 2020-11-27 MED ORDER — FUROSEMIDE 10 MG/ML IJ SOLN: 80.0000 mg | Freq: Two times a day (BID) | INTRAMUSCULAR | Status: DC

## 2020-11-27 SURGICAL SUPPLY — 17 items
CATH INFINITI 6F FL3.5 (CATHETERS) ×2 IMPLANT
CATH LAUNCHER 6FR AL1 (CATHETERS) ×1 IMPLANT
CATH VISTA GUIDE 6FR JR4 (CATHETERS) ×2 IMPLANT
CATHETER LAUNCHER 6FR AL1 (CATHETERS) ×2
DEVICE RAD COMP TR BAND LRG (VASCULAR PRODUCTS) ×2 IMPLANT
ELECT DEFIB PAD ADLT CADENCE (PAD) ×2 IMPLANT
GLIDESHEATH SLEND SS 6F .021 (SHEATH) ×2 IMPLANT
GUIDEWIRE INQWIRE 1.5J.035X260 (WIRE) ×1 IMPLANT
GUIDEWIRE VAS SION BLUE 190 (WIRE) ×2 IMPLANT
INQWIRE 1.5J .035X260CM (WIRE) ×2
KIT ENCORE 26 ADVANTAGE (KITS) ×2 IMPLANT
KIT HEART LEFT (KITS) ×2 IMPLANT
PACK CARDIAC CATHETERIZATION (CUSTOM PROCEDURE TRAY) ×2 IMPLANT
SHEATH PROBE COVER 6X72 (BAG) ×2 IMPLANT
SYR MEDRAD MARK 7 150ML (SYRINGE) ×2 IMPLANT
TRANSDUCER W/STOPCOCK (MISCELLANEOUS) ×2 IMPLANT
TUBING CIL FLEX 10 FLL-RA (TUBING) ×2 IMPLANT

## 2020-11-27 NOTE — ED Notes (Signed)
ED Provider at bedside. [place] 

## 2020-11-27 NOTE — Progress Notes (Signed)
STEMI activation:   Called by Dr. Rubin Payor.  Patient is a 51F patient with T2DM, PAD, HTN, HL, presents with 4 days of dyspnea.  EKG demonstrates STE in AVR and III in non-contiguous leads with STD in AvL and I.  Patient chest pain free, hemodynamically stable, and comfortable per Dr Rubin Payor.  Given lack of symptoms currently, HR ~100bpm, and EKG not completely diagnostic for STEMI, we have decided to continue evaluation, treat with BB, and repeat EKG.  Labs at this time demonstrate decreased hgb to 9.6 (previously 11.6).  Repeat EKG unchanged.  Patient remains asymptomatic but due to EKG abnormalities, will refer for coronary angiography.

## 2020-11-27 NOTE — Progress Notes (Signed)
ANTICOAGULATION CONSULT NOTE - Initial Consult  Pharmacy Consult for heparin Indication: chest pain/ACS  Allergies  Allergen Reactions   Iodinated Diagnostic Agents Nausea And Vomiting   Shellfish Allergy Nausea And Vomiting    Patient Measurements: Height: 5\' 7"  (170.2 cm) Weight: 99.8 kg (220 lb) IBW/kg (Calculated) : 61.6 Heparin Dosing Weight: 83 kg  Vital Signs: Temp: 98.3 F (36.8 C) (10/29 1837) BP: 125/70 (10/29 2129) Pulse Rate: 78 (10/29 2129)  Labs: Recent Labs    11/27/20 1859 11/27/20 1900 11/27/20 1918 11/27/20 2101  HGB 9.6*  --   --  9.2*  HCT 30.2*  --   --  27.0*  PLT 238  --   --   --   APTT  --   --  >200*  --   LABPROT  --   --  15.1  --   INR  --   --  1.2  --   CREATININE 1.11*  --   --   --   TROPONINIHS  --  148*  --   --      Estimated Creatinine Clearance: 53.2 mL/min (A) (by C-G formula based on SCr of 1.11 mg/dL (H)).   Medical History: Past Medical History:  Diagnosis Date   Arthritis    Coronary artery disease    Diabetes mellitus without complication (HCC)    DVT (deep venous thrombosis) (HCC)    Heart attack (HCC)    Hyperlipidemia    Hypertension    Stroke [facility])       Assessment: 75 yo F with STEMI s/p heart cath. No known anticoagulation prior to admission. Pharmacy consulted to start heparin 8 hr after sheath removal.   Patient with small vessel CAD and incomplete occlusions. Patient unable to lie flat for stent placement due to volume overload. Plan to diurese, obtain echo then return to cath lab for PCI.   Goal of Therapy:  Heparin level 0.3-0.7 units/ml Monitor platelets by anticoagulation protocol: Yes   Plan:  Start heparin at 1150 units/hr at 0600, no bolus  F/u 8hr HL   Monitor daily HL, CBC/plt Monitor for signs/symptoms of bleeding  F/u PCI plan   60, PharmD, BCPS, BCCP Clinical Pharmacist  Please check AMION for all [facility] Pharmacy phone numbers After 10:00 PM, call Main Pharmacy  [phone]

## 2020-11-27 NOTE — ED Triage Notes (Signed)
Pt endorses SOB, worsening with exertion for a few days. Pt states she has intermittent chest tightness, non-radiating. Pt with hx of DVT, MI, CVA

## 2020-11-27 NOTE — ED Notes (Signed)
No aspirin by EMS. Not on Cialis or Viagra.

## 2020-11-27 NOTE — ED Provider Notes (Signed)
[facility] EMERGENCY DEPARTMENT Provider Note   CSN: [id number] Arrival date & time: 11/27/20  1830     History Chief Complaint  Patient presents with   Chest Pain   Shortness of Breath    Brittney [name] is a 75 y.o. female.   Chest Pain Associated symptoms: shortness of breath   Associated symptoms: no abdominal pain, no back pain and no weakness   Shortness of Breath Associated symptoms: chest pain   Associated symptoms: no abdominal pain and no rash   Patient presents with chest pain and shortness of breath.  Has had for the last 4 days.  States she has chest tightness at times.  Will somewhat come and go.  Dull in the mid chest.  States she has had a previous heart attack but that was years ago and does not member what it felt like.  Has some swelling in the legs that has been going on 2.  Swellings in both legs.  No fevers.  No cough.  No nausea or vomiting.    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) 10/11/2018   Carpal tunnel syndrome 10/11/2018   Cobalamin deficiency 10/11/2018   Leukocytosis 10/11/2018   Current use of insulin (HCC) 10/11/2018   Memory loss 10/11/2018   Aortic atherosclerosis (HCC) 09/03/2018   Diabetic ulcer of toe of left foot associated with type 2 diabetes mellitus, with fat layer exposed (HCC) 08/26/2018   Bilateral tinnitus 01/03/2016   Carotid artery occlusion without infarction 01/03/2016   Diabetic neuropathy (HCC) 01/03/2016   Diabetic retinopathy (HCC) 01/03/2016   Essential hypertension 01/03/2016   History of MI (myocardial infarction) 01/03/2016   Bruit of right carotid artery 10/27/2015   Solitary pulmonary  nodule 10/27/2015   Hyperlipidemia, unspecified 01/11/2015   Allergic reaction 02/27/2013    Past Surgical History:  Procedure Laterality Date   ABDOMINAL AORTOGRAM W/LOWER EXTREMITY Bilateral 10/28/2018   Procedure: ABDOMINAL AORTOGRAM W/LOWER EXTREMITY;  Surgeon: Maeola Harman, MD;  Location: [facility] INVASIVE CV LAB;  Service: Cardiovascular;  Laterality: Bilateral;   CATARACT EXTRACTION     CESAREAN SECTION     CORONARY ANGIOPLASTY WITH STENT PLACEMENT     PERIPHERAL VASCULAR INTERVENTION  10/28/2018   Procedure: PERIPHERAL VASCULAR INTERVENTION;  Surgeon: Maeola Harman, MD;  Location: [facility] INVASIVE CV LAB;  Service: Cardiovascular;;     OB History   No obstetric history on file.     No family history on file.  Social History   Tobacco Use   Smoking status: Never   Smokeless tobacco: Never  Vaping Use   Vaping Use: Never used  Substance Use Topics   Alcohol use: No   Drug use: No    Home Medications Prior to Admission medications   Medication Sig Start Date End Date Taking? Authorizing Provider  amLODipine (NORVASC) 10 MG tablet Take 10 mg by mouth daily.    [provider]  aspirin 81 MG tablet Take 81 mg by mouth daily.    [provider]  atorvastatin (LIPITOR) 40 MG tablet Take 40 mg by mouth at bedtime.     [provider]  clopidogrel (PLAVIX) 75 MG tablet Take 75 mg by mouth daily.    [provider]  furosemide (LASIX) 40 MG tablet Take 40 mg by mouth daily.    [provider]  gabapentin (NEURONTIN) 300 MG capsule Take 600 mg by mouth 3 (three) times daily.     [provider]  hydrALAZINE (APRESOLINE) 25 MG tablet Take 25 mg by mouth 3 (three) times daily.    [provider]  insulin glargine (LANTUS) 100 UNIT/ML injection Inject 60 Units into the skin daily.     [provider]  metFORMIN (GLUCOPHAGE) 1000 MG tablet Take 1,000 mg by mouth 2 (two) times daily with a meal.     [provider]  metoprolol succinate (TOPROL-XL) 100 MG 24 hr tablet Take 100 mg by mouth at bedtime. Take with or immediately following a meal.     [provider]  Omega-3 Fatty Acids (FISH OIL) 1000 MG CAPS Take 1,000 mg by mouth 2 (two) times daily.    [provider]  pioglitazone (ACTOS) 30 MG tablet Take 30 mg by mouth daily.  09/18/18   [provider]  silver sulfADIAZINE (SILVADENE) 1 % cream Apply 1 application topically daily. Apply to toe 09/05/18   [provider]    Allergies    Iodinated diagnostic agents and Shellfish allergy  Review of Systems   Review of Systems  Constitutional:  Negative for appetite change.  HENT:  Negative for congestion.   Respiratory:  Positive for shortness of breath.   Cardiovascular:  Positive for chest pain and leg swelling.  Gastrointestinal:  Negative for abdominal pain.  Genitourinary:  Negative for flank pain.  Musculoskeletal:  Negative for back pain.  Skin:  Negative for rash.  Neurological:  Negative for weakness.  Psychiatric/Behavioral:  Negative for confusion.    Physical Exam Updated Vital Signs BP 131/67 (BP Location: Left Arm)   Pulse 94   Temp 98.3 F (36.8 C)   Resp (!) 28   Ht 5\' 7"  (1.702 m)   Wt 99.8 kg   SpO2 98%   BMI 34.46 kg/m   Physical Exam Vitals and nursing note reviewed.  HENT:     Head: Atraumatic.  Cardiovascular:     Rate and Rhythm: Normal rate and regular rhythm.  Pulmonary:     Breath sounds: Rales present. No wheezing or rhonchi.  Chest:     Chest wall: No tenderness.  Abdominal:     Tenderness: There is no abdominal tenderness.  Musculoskeletal:     Right lower leg: Edema present.     Left lower leg: Edema present.  Skin:    General: Skin is warm.     Capillary Refill: Capillary refill takes less than 2 seconds.  Neurological:     Mental Status: She is alert.    ED Results / Procedures / Treatments   Labs (all labs ordered are listed,  but only abnormal results are displayed) Labs Reviewed  CBC WITH DIFFERENTIAL/PLATELET - Abnormal; Notable for the following components:      Result Value   RBC 3.32 (*)    Hemoglobin 9.6 (*)    HCT 30.2 (*)    RDW 15.6 (*)    All other components within normal limits  COMPREHENSIVE METABOLIC PANEL - Abnormal; Notable for the following components:   Glucose, Bld 204 (*)    BUN 25 (*)    Creatinine, Ser 1.11 (*)    GFR, Estimated 52 (*)  All other components within normal limits  RESP PANEL BY RT-PCR (FLU A&B, COVID) ARPGX2  LIPID PANEL  PROTIME-INR  HEMOGLOBIN A1C  APTT  TROPONIN I (HIGH SENSITIVITY)    EKG EKG Interpretation  Date/Time:  Saturday November 27 2020 18:47:59 EDT Ventricular Rate:  98 PR Interval:  178 QRS Duration: 117 QT Interval:  378 QTC Calculation: 483 R Axis:   88 Text Interpretation: Sinus rhythm Atrial premature complex Nonspecific intraventricular conduction delay Inferior infarct, acute (RCA) Probable RV involvement, suggest recording right precordial leads >>> Acute MI <<< Confirmed by Benjiman Core [phone]) on 11/27/2020 6:49:16 PM  Radiology DG Chest Portable 1 View  Result Date: 11/27/2020 CLINICAL DATA:  Worsening shortness of breath. EXAM: PORTABLE CHEST 1 VIEW COMPARISON:  March 19, 2015 FINDINGS: Mild diffuse chronic appearing increased interstitial lung markings are seen. Mild areas of atelectasis and/or infiltrate are seen within the bilateral lung bases. There is no evidence of a pleural effusion or pneumothorax. The cardiac silhouette is borderline in size. The visualized skeletal structures are unremarkable. IMPRESSION: Mild diffuse chronic appearing increased interstitial lung markings with mild bibasilar atelectasis and/or infiltrate. Electronically Signed   By: Aram Candela M.D.   On: 11/27/2020 19:15    Procedures Procedures   Medications Ordered in ED Medications  0.9 %  sodium chloride infusion ( Intravenous New  Bag/Given 11/27/20 1858)  aspirin chewable tablet 324 mg (324 mg Oral Given 11/27/20 1900)  heparin injection 4,000 Units (4,000 Units Intravenous Given 11/27/20 1857)  metoprolol tartrate (LOPRESSOR) injection 5 mg (5 mg Intravenous Given 11/27/20 1946)    ED Course  I have reviewed the triage vital signs and the nursing notes.  Pertinent labs & imaging results that were available during my care of the patient were reviewed by me and considered in my medical decision making (see chart for details).    MDM Rules/Calculators/A&P                          Patient presents with chest pain and shortness of breath.  Has had around 4 days of symptoms.  Comes and goes.  Particular worse with exertion.  Looks dyspneic just with transferring from wheelchair to bed.  Initial EKG showed some ST elevation in aVR and mild but nondiagnostic and lead III.  States that initially upon arrival here she had shortness of breath but no chest pain.   1900 discussed with Dr.Thukkani.  STEMI doctor on-call.  Since ST elevation is really only in aVR without other contiguous lead involvement and patient states she is not having active chest pain at this time we will cancel code STEMI.  I have been discussing with Dr. Lynnette Caffey throughout the time here.  We will give beta-blockade.  Has been given aspirin and heparin.  Continues to feel better.  No chest pain and not dyspneic now when she is at rest.  However repeat EKGs continued to show some inferior elevation along with the aVR elevation.  Also depression and aVL and lead I.  Decision made at around 743 after repeat EKGs to call STEMI and get her to more definitive care.  Chest x-ray did not show clear edema although did show some interstitial lung disease.  Troponin still pending.  Kidney function good.  States has IV dye allergy but had been just nausea and vomiting.  CRITICAL CARE Performed by: Benjiman Core Total critical care time: 30 minutes Critical care  time was exclusive of separately billable procedures  and treating other patients. Critical care was necessary to treat or prevent imminent or life-threatening deterioration. Critical care was time spent personally by me on the following activities: development of treatment plan with patient and/or surrogate as well as nursing, discussions with consultants, evaluation of patient's response to treatment, examination of patient, obtaining history from patient or surrogate, ordering and performing treatments and interventions, ordering and review of laboratory studies, ordering and review of radiographic studies, pulse oximetry and re-evaluation of patient's condition.       Final Clinical Impression(s) / ED Diagnoses Final diagnoses:  ST elevation myocardial infarction (STEMI), unspecified artery [facility])    Rx / DC Orders ED Discharge Orders     None        Benjiman Core, MD 11/28/20 0005

## 2020-11-27 NOTE — H&P (Signed)
Cardiology Admission History and Physical:   Patient ID: Brittney [name] MRN: [id number]; DOB: 1945/05/10   Admission date: 11/27/2020  PCP:  Andreas Blower., MD   [facility] Providers Cardiologist:  None        Chief Complaint:  Shortness of Breath  Patient Profile:   Brittney [name] is a 75 y.o. female with pmh sx for T2DM, PAD, HTN, HL who is being seen 11/27/2020 for the evaluation of shortness of breath and concern for STEMI.   History of Present Illness:   Brittney [name] is a 75 y.o. female with pmh sx for T2DM, PAD, HTN, HL who is being seen 11/27/2020 for the evaluation of shortness of breath and concern for STEMI. She was having dyspnea since the past 3-4 days. Also complained of intermittent chest tightness, non-radiating- however not currently present in the ED. She stated that she had a stent placed long time ago and had a history of MI. In the ED, EKG demonstrated STE in AVR and III in non-contiguous leads with STD in AvL and I.  Patient was chest pain free, hemodynamically stable- however considering worse looking EKG it was decided to activate CODE STEMI and patient was taken to the cath lab. Chest x-ray did not show clear edema although did show some interstitial lung disease. Troponin was 148. Cr=1.1. HB =9.6    Past Medical History:  Diagnosis Date   Arthritis    Coronary artery disease    Diabetes mellitus without complication (HCC)    DVT (deep venous thrombosis) (HCC)    Heart attack (HCC)    Hyperlipidemia    Hypertension    Stroke [facility])     Past Surgical History:  Procedure Laterality Date   ABDOMINAL AORTOGRAM W/LOWER EXTREMITY Bilateral 10/28/2018   Procedure: ABDOMINAL AORTOGRAM W/LOWER EXTREMITY;  Surgeon: Maeola Harman, MD;  Location: [facility] INVASIVE CV LAB;  Service: Cardiovascular;  Laterality: Bilateral;   CATARACT EXTRACTION     CESAREAN SECTION     CORONARY ANGIOPLASTY WITH STENT PLACEMENT     PERIPHERAL VASCULAR INTERVENTION   10/28/2018   Procedure: PERIPHERAL VASCULAR INTERVENTION;  Surgeon: Maeola Harman, MD;  Location: [facility] INVASIVE CV LAB;  Service: Cardiovascular;;     Medications Prior to Admission: Prior to Admission medications   Medication Sig Start Date End Date Taking? Authorizing Provider  amLODipine (NORVASC) 10 MG tablet Take 10 mg by mouth daily.    [provider]  aspirin 81 MG tablet Take 81 mg by mouth daily.    [provider]  atorvastatin (LIPITOR) 40 MG tablet Take 40 mg by mouth at bedtime.     [provider]  clopidogrel (PLAVIX) 75 MG tablet Take 75 mg by mouth daily.    [provider]  furosemide (LASIX) 40 MG tablet Take 40 mg by mouth daily.    [provider]  gabapentin (NEURONTIN) 300 MG capsule Take 600 mg by mouth 3 (three) times daily.     [provider]  hydrALAZINE (APRESOLINE) 25 MG tablet Take 25 mg by mouth 3 (three) times daily.    [provider]  insulin glargine (LANTUS) 100 UNIT/ML injection Inject 60 Units into the skin daily.     [provider]  metFORMIN (GLUCOPHAGE) 1000 MG tablet Take 1,000 mg by mouth 2 (two) times daily with a meal.    [provider]  metoprolol succinate (TOPROL-XL) 100 MG 24 hr tablet Take 100 mg by mouth at bedtime. Take with or immediately following  a meal.     [provider]  Omega-3 Fatty Acids (FISH OIL) 1000 MG CAPS Take 1,000 mg by mouth 2 (two) times daily.    [provider]  pioglitazone (ACTOS) 30 MG tablet Take 30 mg by mouth daily.  09/18/18   [provider]  silver sulfADIAZINE (SILVADENE) 1 % cream Apply 1 application topically daily. Apply to toe 09/05/18   [provider]     Allergies:    Allergies  Allergen Reactions   Iodinated Diagnostic Agents Nausea And Vomiting   Shellfish Allergy Nausea And Vomiting    Social History:   Social History   Socioeconomic History   Marital status: Married     Spouse name: Not on file   Number of children: Not on file   Years of education: Not on file   Highest education level: Not on file  Occupational History   Not on file  Tobacco Use   Smoking status: Never   Smokeless tobacco: Never  Vaping Use   Vaping Use: Never used  Substance and Sexual Activity   Alcohol use: No   Drug use: No   Sexual activity: Not on file  Other Topics Concern   Not on file  Social History Narrative   Not on file   Social Determinants of Health   Financial Resource Strain: Not on file  Food Insecurity: Not on file  Transportation Needs: Not on file  Physical Activity: Not on file  Stress: Not on file  Social Connections: Not on file  Intimate Partner Violence: Not on file    Family History:  No history of sudden cardiac deaths The patient's family history is not on file.    ROS:  Please see the history of present illness.  All other ROS reviewed and negative.     Physical Exam/Data:   Vitals:   11/27/20 2115 11/27/20 2120 11/27/20 2125 11/27/20 2129  BP: 126/63 130/74 133/73 125/70  Pulse: 78 70 71 78  Resp: 12 19 (!) 23 15  Temp:      SpO2: 99% 100% 99% 99%  Weight:      Height:        Intake/Output Summary (Last 24 hours) at 11/27/2020 2219 Last data filed at 11/27/2020 2142 Gross per 24 hour  Intake --  Output 250 ml  Net -250 ml   Last 3 Weights 11/27/2020 10/28/2018 10/11/2018  Weight (lbs) 220 lb 220 lb 221 lb 4.8 oz  Weight (kg) 99.791 kg 99.791 kg 100.381 kg     Body mass index is 34.46 kg/m.  General:  Well nourished, well developed,  moderate distress HEENT: normal Neck: Elevated JVD Vascular: No carotid bruits; Distal pulses 2+ bilaterally   Cardiac: Tachycardic Lungs: Crackled heard Abd: soft, nontender, no hepatomegaly  Ext: Pitting edema Musculoskeletal:  No deformities, BUE and BLE strength normal and equal Skin: warm and dry  Neuro:  CNs 2-12 intact, no focal abnormalities noted Psych:  Normal affect     EKG: STE in AVR and III in non-contiguous leads with STD in AvL and I.  Laboratory Data:  High Sensitivity Troponin:   Recent Labs  Lab 11/27/20 1900  TROPONINIHS 148*      Chemistry Recent Labs  Lab 11/27/20 1859 11/27/20 2101  NA 137 140  K 3.9 3.8  CL 103  --   CO2 24  --   GLUCOSE 204*  --   BUN 25*  --   CREATININE 1.11*  --  CALCIUM 9.2  --   GFRNONAA 52*  --   ANIONGAP 10  --     Recent Labs  Lab 11/27/20 1859  PROT 7.9  ALBUMIN 3.5  AST 23  ALT 21  ALKPHOS 44  BILITOT 0.4   Lipids  Recent Labs  Lab 11/27/20 1859  CHOL PENDING  TRIG PENDING  HDL PENDING  LDLCALC NOT CALCULATED  CHOLHDL PENDING   Hematology Recent Labs  Lab 11/27/20 1859 11/27/20 2101  WBC 7.1  --   RBC 3.32*  --   HGB 9.6* 9.2*  HCT 30.2* 27.0*  MCV 91.0  --   MCH 28.9  --   MCHC 31.8  --   RDW 15.6*  --   PLT 238  --    Thyroid No results for input(s): TSH, FREET4 in the last 168 hours. BNPNo results for input(s): BNP, PROBNP in the last 168 hours.  DDimer No results for input(s): DDIMER in the last 168 hours.   Radiology/Studies:  CARDIAC CATHETERIZATION  Result Date: 11/27/2020   Prox RCA lesion is 90% stenosed.   Mid RCA to Dist RCA lesion is 100% stenosed.   Dist LM lesion is 20% stenosed.   Prox Cx lesion is 80% stenosed.   Prox LAD lesion is 70% stenosed.   Mid LAD lesion is 50% stenosed.   Dist LAD lesion is 80% stenosed.   1st Mrg lesion is 60% stenosed.   LV end diastolic pressure is severely elevated. 1.  Severe native vessel disease with high-grade disease of the small mid right coronary artery with a distal occlusion; high-grade disease of the proximal left circumflex, and moderate disease of the proximal LAD and distal LAD.  The ostial LAD and left main do not appear to be severely diseased. 2.  Elevated LVEDP of 26 mmHg; the patient received 80 mg of Lasix in total in the cardiac catheterization laboratory. 3.  Given that the patient was having no  chest pain and had no acute occlusions, the patient will be optimized from a heart failure standpoint with planned PCI of the left circumflex and LAD later during this admission. Recommendation: Resume the patient's home aspirin and Plavix, continue a heparin drip, and diuresis.  Will obtain echocardiogram.   DG Chest Portable 1 View  Result Date: 11/27/2020 CLINICAL DATA:  Worsening shortness of breath. EXAM: PORTABLE CHEST 1 VIEW COMPARISON:  March 19, 2015 FINDINGS: Mild diffuse chronic appearing increased interstitial lung markings are seen. Mild areas of atelectasis and/or infiltrate are seen within the bilateral lung bases. There is no evidence of a pleural effusion or pneumothorax. The cardiac silhouette is borderline in size. The visualized skeletal structures are unremarkable. IMPRESSION: Mild diffuse chronic appearing increased interstitial lung markings with mild bibasilar atelectasis and/or infiltrate. Electronically Signed   By: Aram Candela M.D.   On: 11/27/2020 19:15     Assessment and Plan:   # STEMI # CAD # Acute Heart Failure  -Aspirin load followed by Aspirin daily 81 mg -Resume Home Plavix -Heparin Drip -Nitro PRN -IV Lasix 80 BID -Echo in AM -LHC showed Severe native vessel disease with high-grade disease of the small mid right coronary artery with a distal occlusion; high-grade disease of the proximal left circumflex, and moderate disease of the proximal LAD and distal LAD.  The ostial LAD and left main do not appear to be severely diseased. - Elevated LVEDP of 26 mmHg -  Given that the patient was having no antecedent chest pain and had no  acute occlusions on this study, the patient will be admitted and optimized from a heart failure standpoint. A decision regarding medical therapy of coronary artery disease versus PCI of the left circumflex and LAD later during this admission will be made. -Atorvastatin 80 mg -Low dose BB  # DM: Insulin Per Protocol # HTN:  Amlodipine and BB  For questions or updates, please contact [facility] Please consult [email or web address] for contact info under     Signed, Hermelinda Dellen, MD  11/27/2020 10:19 PM

## 2020-11-27 NOTE — Progress Notes (Signed)
ANTICOAGULATION CONSULT NOTE - Initial Consult  Pharmacy Consult for heparin Indication: chest pain/ACS  Allergies  Allergen Reactions   Iodinated Diagnostic Agents Nausea And Vomiting   Shellfish Allergy Nausea And Vomiting    Patient Measurements: Height: 5\' 7"  (170.2 cm) Weight: 99.8 kg (220 lb) IBW/kg (Calculated) : 61.6 Heparin Dosing Weight: 83 kg  Vital Signs: Temp: 98.3 F (36.8 C) (10/29 1837) BP: 128/66 (10/29 2000) Pulse Rate: 92 (10/29 2000)  Labs: Recent Labs    11/27/20 1859 11/27/20 1900 11/27/20 1918  HGB 9.6*  --   --   HCT 30.2*  --   --   PLT 238  --   --   APTT  --   --  >200*  LABPROT  --   --  15.1  INR  --   --  1.2  CREATININE 1.11*  --   --   TROPONINIHS  --  148*  --     Estimated Creatinine Clearance: 53.2 mL/min (A) (by C-G formula based on SCr of 1.11 mg/dL (H)).   Medical History: Past Medical History:  Diagnosis Date   Arthritis    Coronary artery disease    Diabetes mellitus without complication (HCC)    DVT (deep venous thrombosis) (HCC)    Heart attack (HCC)    Hyperlipidemia    Hypertension    Stroke (HCC)     Medications: see MAR  Assessment: 75 yo F with STEMI - Heparin gtt for anticoagulation. Heparin bolus given per EDP. No AC PTA. Hgb 9.6 - previous 11.6, no overt bleeding noted per chart review. Being transferred for emergent cath.  Goal of Therapy:  Heparin level 0.3-0.7 units/ml Monitor platelets by anticoagulation protocol: Yes   Plan:  Start heparin infusion at 1000 units/hr Check anti-Xa level in 8 hours and daily while on heparin Continue to monitor H&H and platelets  60, PharmD, [facility] Emergency Medicine Clinical Pharmacist ED RPh Phone: [phone] Main RX: [phone]

## 2020-11-27 NOTE — ED Notes (Signed)
Pt. Transferred by [facility].

## 2020-11-28 ENCOUNTER — Inpatient Hospital Stay (HOSPITAL_COMMUNITY): Payer: Medicare Other

## 2020-11-28 DIAGNOSIS — I214 Non-ST elevation (NSTEMI) myocardial infarction: Secondary | ICD-10-CM

## 2020-11-28 LAB — ECHOCARDIOGRAM COMPLETE
Area-P 1/2: 5.38 cm2
Height: 67 in
S' Lateral: 4 cm
Weight: 3633.18 oz

## 2020-11-28 LAB — LIPID PANEL
Cholesterol: 155 mg/dL (ref 0–200)
HDL: 73 mg/dL (ref >40)
Total CHOL/HDL Ratio: 2.1 RATIO
Triglycerides: 94 mg/dL (ref <150)
VLDL: 19 mg/dL (ref 0–40)

## 2020-11-28 LAB — BASIC METABOLIC PANEL
Anion gap: 7 (ref 5–15)
BUN: 20 mg/dL (ref 8–23)
CO2: 27 mmol/L (ref 22–32)
Calcium: 8.7 mg/dL — ABNORMAL LOW (ref 8.9–10.3)
Chloride: 103 mmol/L (ref 98–111)
Creatinine, Ser: 1.07 mg/dL — ABNORMAL HIGH (ref 0.44–1.00)
GFR, Estimated: 54 mL/min — ABNORMAL LOW (ref >60)
Glucose, Bld: 180 mg/dL — ABNORMAL HIGH (ref 70–99)
Potassium: 3.7 mmol/L (ref 3.5–5.1)
Sodium: 137 mmol/L (ref 135–145)

## 2020-11-28 LAB — CBC
HCT: 30.5 % — ABNORMAL LOW (ref 36.0–46.0)
Hemoglobin: 9.6 g/dL — ABNORMAL LOW (ref 12.0–15.0)
MCH: 29 pg (ref 26.0–34.0)
MCHC: 31.5 g/dL (ref 30.0–36.0)
MCV: 92.1 fL (ref 80.0–100.0)
Platelets: 226 10*3/uL (ref 150–400)
RBC: 3.31 MIL/uL — ABNORMAL LOW (ref 3.87–5.11)
RDW: 15.7 % — ABNORMAL HIGH (ref 11.5–15.5)
WBC: 7.8 10*3/uL (ref 4.0–10.5)
nRBC: 0 % (ref 0.0–0.2)

## 2020-11-28 LAB — GLUCOSE, CAPILLARY
Glucose-Capillary: 93 mg/dL (ref 70–99)
Glucose-Capillary: 165 mg/dL — ABNORMAL HIGH (ref 70–99)

## 2020-11-28 LAB — HEMOGLOBIN A1C
Hgb A1c MFr Bld: 7.3 % — ABNORMAL HIGH (ref 4.8–5.6)
Mean Plasma Glucose: 162.81 mg/dL

## 2020-11-28 LAB — MRSA NEXT GEN BY PCR, NASAL: MRSA by PCR Next Gen: DETECTED — AB

## 2020-11-28 LAB — HEPARIN LEVEL (UNFRACTIONATED): Heparin Unfractionated: 0.49 IU/mL (ref 0.30–0.70)

## 2020-11-28 MED ORDER — PERFLUTREN LIPID MICROSPHERE
1.0000 mL | INTRAVENOUS | Status: AC | PRN
  Administered 2020-11-28: 2 mL via INTRAVENOUS
  Filled 2020-11-28: qty 10

## 2020-11-28 MED ORDER — ORAL CARE MOUTH RINSE
15.0000 mL | Freq: Two times a day (BID) | OROMUCOSAL | Status: DC
  Administered 2020-11-28 – 2020-12-09 (×17): 15 mL via OROMUCOSAL

## 2020-11-28 MED ORDER — LOSARTAN POTASSIUM 25 MG PO TABS
25.0000 mg | ORAL_TABLET | Freq: Every day | ORAL | Status: DC
  Administered 2020-11-28 – 2020-11-29 (×2): 25 mg via ORAL
  Filled 2020-11-28 (×2): qty 1

## 2020-11-28 MED ORDER — POTASSIUM CHLORIDE CRYS ER 20 MEQ PO TBCR
40.0000 meq | EXTENDED_RELEASE_TABLET | Freq: Once | ORAL | Status: AC
  Administered 2020-11-28: 40 meq via ORAL
  Filled 2020-11-28: qty 2

## 2020-11-28 MED ORDER — CHLORHEXIDINE GLUCONATE CLOTH 2 % EX PADS
6.0000 | MEDICATED_PAD | Freq: Every day | CUTANEOUS | Status: DC
  Administered 2020-11-28 – 2020-12-07 (×9): 6 via TOPICAL

## 2020-11-28 MED ORDER — MUPIROCIN 2 % EX OINT
1.0000 "application " | TOPICAL_OINTMENT | Freq: Two times a day (BID) | CUTANEOUS | Status: AC
  Administered 2020-11-28 – 2020-12-02 (×9): 1 via NASAL
  Filled 2020-11-28 (×3): qty 22

## 2020-11-28 MED ORDER — SPIRONOLACTONE 25 MG PO TABS
25.0000 mg | ORAL_TABLET | Freq: Every day | ORAL | Status: DC
  Administered 2020-11-28: 25 mg via ORAL
  Filled 2020-11-28: qty 1

## 2020-11-28 NOTE — Progress Notes (Signed)
ANTICOAGULATION CONSULT NOTE - Follow Up Consult  Pharmacy Consult for heparin Indication: chest pain/ACS  Allergies  Allergen Reactions   Iodinated Diagnostic Agents Nausea And Vomiting   Shellfish Allergy Nausea And Vomiting    Patient Measurements: Height: 5\' 7"  (170.2 cm) Weight: 103 kg (227 lb 1.2 oz) IBW/kg (Calculated) : 61.6 Heparin Dosing Weight: 83 kg  Vital Signs: Temp: 99.5 F (37.5 C) (10/30 1129) Temp Source: Axillary (10/30 1129) BP: 98/48 (10/30 1413) Pulse Rate: 76 (10/30 1300)  Labs: Recent Labs    11/27/20 1859 11/27/20 1900 11/27/20 1918 11/27/20 2101 11/27/20 2246 11/28/20 0040 11/28/20 0611 11/28/20 1331  HGB 9.6*  --   --  9.2*  --  9.6*  --   --   HCT 30.2*  --   --  27.0*  --  30.5*  --   --   PLT 238  --   --   --   --  226  --   --   APTT  --   --  >200*  --   --   --   --   --   LABPROT  --   --  15.1  --   --   --   --   --   INR  --   --  1.2  --   --   --   --   --   HEPARINUNFRC  --   --   --   --   --   --   --  0.49  CREATININE 1.11*  --   --   --   --   --  1.07*  --   TROPONINIHS  --  148*  --   --  1,249*  --   --   --      Estimated Creatinine Clearance: 56.1 mL/min (A) (by C-G formula based on SCr of 1.07 mg/dL (H)).   Medical History: Past Medical History:  Diagnosis Date   Arthritis    Coronary artery disease    Diabetes mellitus without complication (HCC)    DVT (deep venous thrombosis) (HCC)    Heart attack (HCC)    Hyperlipidemia    Hypertension    Stroke [facility])       Assessment: 75 yo F with STEMI s/p heart cath. No known anticoagulation prior to admission. Pharmacy consulted to start heparin 8 hr after sheath removal.   Patient with small vessel CAD and incomplete occlusions. Patient unable to lie flat for stent placement due to volume overload. Plan to diurese, obtain echo then return to cath lab for PCI.  Heparin drip 1150 units/hr Heparin level 0.4 at goal and CBC ok Asa,clopidogrel and atorvastatin  started  EF 30% losartan and spironolactone started - titrate medications as BP and Cr allow.   Goal of Therapy:  Heparin level 0.3-0.7 units/ml Monitor platelets by anticoagulation protocol: Yes   Plan:  Continue heparin at 1150 units/hr  Monitor daily HL, CBC/plt Monitor for signs/symptoms of bleeding  F/u PCI plan    60 Pharm.D. CPP, BCPS Clinical Pharmacist [phone] 11/28/2020 2:37 PM    Please check AMION for all [facility] Pharmacy phone numbers After 10:00 PM, call Main Pharmacy [phone]

## 2020-11-28 NOTE — Progress Notes (Signed)
Progress Note  Patient Name: Brittney [name] Date of Encounter: 11/28/2020  [facility] Cardiologist: Dr Lynnette Caffey  Subjective   No CP; dyspnea mildly improved  Inpatient Medications    Scheduled Meds:  aspirin  81 mg Oral Daily   atorvastatin  80 mg Oral Daily   clopidogrel  75 mg Oral Daily   furosemide  80 mg Intravenous BID   insulin aspart  0-15 Units Subcutaneous TID WC   insulin aspart  0-5 Units Subcutaneous QHS   sodium chloride flush  3 mL Intravenous Q12H   Continuous Infusions:  sodium chloride 20 mL/hr at 11/28/20 0600   sodium chloride     heparin 1,150 Units/hr (11/28/20 0645)   PRN Meds: sodium chloride, acetaminophen, ondansetron (ZOFRAN) IV, sodium chloride flush   Vital Signs    Vitals:   11/28/20 0500 11/28/20 0530 11/28/20 0600 11/28/20 0630  BP: 124/61 (!) 112/50 (!) 132/56 (!) 125/57  Pulse: 82 77 81 81  Resp: 18 14 14 18   Temp:      SpO2: 98% 97% 100% 99%  Weight:    103 kg  Height:        Intake/Output Summary (Last 24 hours) at 11/28/2020 0714 Last data filed at 11/28/2020 0600 Gross per 24 hour  Intake 191.5 ml  Output 1500 ml  Net -1308.5 ml   Last 3 Weights 11/28/2020 11/27/2020 10/28/2018  Weight (lbs) 227 lb 1.2 oz 220 lb 220 lb  Weight (kg) 103 kg 99.791 kg 99.791 kg      Telemetry    NSR, PVCs; 5 and 14 beat runs NSVT - Personally Reviewed   Physical Exam   GEN: No acute distress.   Neck: supple Cardiac: RRR Respiratory: Clear to auscultation bilaterally. GI: Soft, nontender, non-distended  MS: 2 + edema; radial cath site with no hematoma Neuro:  Nonfocal  Psych: Normal affect   Labs    High Sensitivity Troponin:   Recent Labs  Lab 11/27/20 1900 11/27/20 2246  TROPONINIHS 148* 1,249*     Chemistry Recent Labs  Lab 11/27/20 1859 11/27/20 2101  NA 137 140  K 3.9 3.8  CL 103  --   CO2 24  --   GLUCOSE 204*  --   BUN 25*  --   CREATININE 1.11*  --   CALCIUM 9.2  --   PROT 7.9  --   ALBUMIN  3.5  --   AST 23  --   ALT 21  --   ALKPHOS 44  --   BILITOT 0.4  --   GFRNONAA 52*  --   ANIONGAP 10  --     Lipids  Recent Labs  Lab 11/27/20 1859  CHOL 155  TRIG 94  HDL 73  LDLCALC NOT CALCULATED  CHOLHDL 2.1    Hematology Recent Labs  Lab 11/27/20 1859 11/27/20 2101 11/28/20 0040  WBC 7.1  --  7.8  RBC 3.32*  --  3.31*  HGB 9.6* 9.2* 9.6*  HCT 30.2* 27.0* 30.5*  MCV 91.0  --  92.1  MCH 28.9  --  29.0  MCHC 31.8  --  31.5  RDW 15.6*  --  15.7*  PLT 238  --  226    Radiology    CARDIAC CATHETERIZATION  Addendum Date: 11/27/2020     Prox RCA lesion is 90% stenosed.   Mid RCA to Dist RCA lesion is 100% stenosed.   Dist LM lesion is 20% stenosed.   Prox Cx lesion is 80% stenosed.  Prox LAD lesion is 70% stenosed.   Mid LAD lesion is 50% stenosed.   Dist LAD lesion is 80% stenosed.   1st Mrg lesion is 60% stenosed.   LV end diastolic pressure is severely elevated. 1.  Severe native vessel disease with high-grade disease of the small mid right coronary artery with a distal occlusion; high-grade disease of the proximal left circumflex, and moderate disease of the proximal LAD and distal LAD.  The ostial LAD and left main do not appear to be severely diseased. 2.  Elevated LVEDP of 26 mmHg; the patient received 80 mg of intravenous lasix in total in the cardiac catheterization laboratory. 3.  Given that the patient was having no antecedent chest pain and had no acute occlusions on this study, the patient will be admitted and optimized from a heart failure standpoint.  A decision regarding medical therapy of coronary artery disease versus PCI of the left circumflex and LAD later during this admission will be made.   Result Date: 11/27/2020   Prox RCA lesion is 90% stenosed.   Mid RCA to Dist RCA lesion is 100% stenosed.   Dist LM lesion is 20% stenosed.   Prox Cx lesion is 80% stenosed.   Prox LAD lesion is 70% stenosed.   Mid LAD lesion is 50% stenosed.   Dist LAD lesion is  80% stenosed.   1st Mrg lesion is 60% stenosed.   LV end diastolic pressure is severely elevated. 1.  Severe native vessel disease with high-grade disease of the small mid right coronary artery with a distal occlusion; high-grade disease of the proximal left circumflex, and moderate disease of the proximal LAD and distal LAD.  The ostial LAD and left main do not appear to be severely diseased. 2.  Elevated LVEDP of 26 mmHg; the patient received 80 mg of Lasix in total in the cardiac catheterization laboratory. 3.  Given that the patient was having no chest pain and had no acute occlusions, the patient will be optimized from a heart failure standpoint with planned PCI of the left circumflex and LAD later during this admission. Recommendation: Resume the patient's home aspirin and Plavix, continue a heparin drip, and diuresis.  Will obtain echocardiogram.   DG Chest Portable 1 View  Result Date: 11/27/2020 CLINICAL DATA:  Worsening shortness of breath. EXAM: PORTABLE CHEST 1 VIEW COMPARISON:  March 19, 2015 FINDINGS: Mild diffuse chronic appearing increased interstitial lung markings are seen. Mild areas of atelectasis and/or infiltrate are seen within the bilateral lung bases. There is no evidence of a pleural effusion or pneumothorax. The cardiac silhouette is borderline in size. The visualized skeletal structures are unremarkable. IMPRESSION: Mild diffuse chronic appearing increased interstitial lung markings with mild bibasilar atelectasis and/or infiltrate. Electronically Signed   By: Aram Candela M.D.   On: 11/27/2020 19:15     Patient Profile     75 y.o. female with past medical history CAD, diabetes mellitus, hypertension, hyperlipidemia, peripheral vascular disease admitted with possible acute ST elevation myocardial infarction.  Emergent cardiac catheterization revealed 90% proximal RCA, occluded distal RCA, 20% left main, 80% proximal circumflex, 70% proximal LAD, 50% mid LAD, 80% distal  LAD, 60% OM1 and elevated left ventricular end-diastolic pressure of 26 mmHg.  Assessment & Plan    1 question ST elevation myocardial infarction-initial electrocardiogram with ST elevation in lead III.  However catheterization revealed no occlusions.  No intervention performed.  Troponin has increased to 1249.  Patient denies chest pain.  Plan medical therapy.  Continue aspirin, Plavix, heparin and statin.  2 coronary artery disease-severe coronary disease noted at time of catheterization.  Plan is medical therapy versus PCI.  Interventional team will review films tomorrow for final decision.  Continue medical therapy as outlined above.  3 acute systolic congestive heart failure-predominant issue at time of admission was dyspnea and lower extremity edema.  LVEDP 26 at time of catheterization.  Continue Lasix at present dose.  Add spironolactone 25 mg daily.  Follow renal function.  Await results of echocardiogram.  Add ARB or Entresto pending results and beta-blockade later as CHF improves.  Add [place] or [place] later.  4 hypertension-blood pressure is controlled this morning.  We will add CHF medications as outlined above based on echocardiogram results.  5 hyperlipidemia-continue statin.  6 normocytic anemia-we will need further evaluation once CHF improves.  For questions or updates, please contact [facility] Please consult [email or web address] for contact info under        Signed, Olga Millers, MD  11/28/2020, 7:14 AM

## 2020-11-29 ENCOUNTER — Other Ambulatory Visit (HOSPITAL_COMMUNITY): Payer: Self-pay

## 2020-11-29 ENCOUNTER — Inpatient Hospital Stay: Payer: Self-pay

## 2020-11-29 ENCOUNTER — Encounter (HOSPITAL_COMMUNITY): Payer: Self-pay | Admitting: Internal Medicine

## 2020-11-29 DIAGNOSIS — I214 Non-ST elevation (NSTEMI) myocardial infarction: Secondary | ICD-10-CM | POA: Diagnosis not present

## 2020-11-29 DIAGNOSIS — I5043 Acute on chronic combined systolic (congestive) and diastolic (congestive) heart failure: Secondary | ICD-10-CM | POA: Diagnosis not present

## 2020-11-29 DIAGNOSIS — I10 Essential (primary) hypertension: Secondary | ICD-10-CM

## 2020-11-29 DIAGNOSIS — D649 Anemia, unspecified: Secondary | ICD-10-CM

## 2020-11-29 DIAGNOSIS — E782 Mixed hyperlipidemia: Secondary | ICD-10-CM | POA: Diagnosis not present

## 2020-11-29 DIAGNOSIS — I5021 Acute systolic (congestive) heart failure: Secondary | ICD-10-CM | POA: Diagnosis not present

## 2020-11-29 DIAGNOSIS — E119 Type 2 diabetes mellitus without complications: Secondary | ICD-10-CM

## 2020-11-29 LAB — BASIC METABOLIC PANEL
Anion gap: 11 (ref 5–15)
Anion gap: 8 (ref 5–15)
BUN: 19 mg/dL (ref 8–23)
BUN: 20 mg/dL (ref 8–23)
CO2: 24 mmol/L (ref 22–32)
CO2: 28 mmol/L (ref 22–32)
Calcium: 8.6 mg/dL — ABNORMAL LOW (ref 8.9–10.3)
Calcium: 8.6 mg/dL — ABNORMAL LOW (ref 8.9–10.3)
Chloride: 99 mmol/L (ref 98–111)
Chloride: 100 mmol/L (ref 98–111)
Creatinine, Ser: 1.18 mg/dL — ABNORMAL HIGH (ref 0.44–1.00)
Creatinine, Ser: 1.36 mg/dL — ABNORMAL HIGH (ref 0.44–1.00)
GFR, Estimated: 48 mL/min — ABNORMAL LOW (ref >60)
GFR, Estimated: 41 mL/min — ABNORMAL LOW (ref >60)
Glucose, Bld: 133 mg/dL — ABNORMAL HIGH (ref 70–99)
Glucose, Bld: 216 mg/dL — ABNORMAL HIGH (ref 70–99)
Potassium: 3.8 mmol/L (ref 3.5–5.1)
Potassium: 4.2 mmol/L (ref 3.5–5.1)
Sodium: 134 mmol/L — ABNORMAL LOW (ref 135–145)
Sodium: 136 mmol/L (ref 135–145)

## 2020-11-29 LAB — FERRITIN: Ferritin: 91 ng/mL (ref 11–307)

## 2020-11-29 LAB — MAGNESIUM
Magnesium: 1.8 mg/dL (ref 1.7–2.4)
Magnesium: 2.4 mg/dL (ref 1.7–2.4)

## 2020-11-29 LAB — CBC
HCT: 26.4 % — ABNORMAL LOW (ref 36.0–46.0)
Hemoglobin: 8.4 g/dL — ABNORMAL LOW (ref 12.0–15.0)
MCH: 28.9 pg (ref 26.0–34.0)
MCHC: 31.8 g/dL (ref 30.0–36.0)
MCV: 90.7 fL (ref 80.0–100.0)
Platelets: 203 10*3/uL (ref 150–400)
RBC: 2.91 MIL/uL — ABNORMAL LOW (ref 3.87–5.11)
RDW: 15.5 % (ref 11.5–15.5)
WBC: 9.5 10*3/uL (ref 4.0–10.5)
nRBC: 0 % (ref 0.0–0.2)

## 2020-11-29 LAB — IRON AND TIBC
Iron: 41 ug/dL (ref 28–170)
Saturation Ratios: 16 % (ref 10.4–31.8)
TIBC: 258 ug/dL (ref 250–450)
UIBC: 217 ug/dL

## 2020-11-29 LAB — GLUCOSE, CAPILLARY
Glucose-Capillary: 206 mg/dL — ABNORMAL HIGH (ref 70–99)
Glucose-Capillary: 149 mg/dL — ABNORMAL HIGH (ref 70–99)
Glucose-Capillary: 151 mg/dL — ABNORMAL HIGH (ref 70–99)
Glucose-Capillary: 158 mg/dL — ABNORMAL HIGH (ref 70–99)
Glucose-Capillary: 131 mg/dL — ABNORMAL HIGH (ref 70–99)

## 2020-11-29 LAB — HEPARIN LEVEL (UNFRACTIONATED): Heparin Unfractionated: 0.68 IU/mL (ref 0.30–0.70)

## 2020-11-29 LAB — LDL CHOLESTEROL, DIRECT: Direct LDL: 60.7 mg/dL (ref 0–99)

## 2020-11-29 LAB — URIC ACID: Uric Acid, Serum: 9.3 mg/dL — ABNORMAL HIGH (ref 2.5–7.1)

## 2020-11-29 MED ORDER — SODIUM CHLORIDE 0.9 % IV BOLUS
250.0000 mL | Freq: Once | INTRAVENOUS | Status: AC
  Administered 2020-11-29: 250 mL via INTRAVENOUS

## 2020-11-29 MED ORDER — METOPROLOL SUCCINATE ER 25 MG PO TB24
12.5000 mg | ORAL_TABLET | Freq: Every day | ORAL | Status: DC
  Administered 2020-11-29: 12.5 mg via ORAL
  Filled 2020-11-29: qty 1

## 2020-11-29 MED ORDER — FUROSEMIDE 10 MG/ML IJ SOLN
80.0000 mg | Freq: Three times a day (TID) | INTRAMUSCULAR | Status: DC
  Filled 2020-11-29: qty 8

## 2020-11-29 MED ORDER — ENOXAPARIN SODIUM 60 MG/0.6ML IJ SOSY
60.0000 mg | PREFILLED_SYRINGE | INTRAMUSCULAR | Status: DC
  Administered 2020-11-29: 60 mg via SUBCUTANEOUS
  Filled 2020-11-29 (×2): qty 0.6

## 2020-11-29 MED ORDER — POTASSIUM CHLORIDE CRYS ER 20 MEQ PO TBCR
40.0000 meq | EXTENDED_RELEASE_TABLET | Freq: Once | ORAL | Status: AC
  Administered 2020-11-29: 40 meq via ORAL
  Filled 2020-11-29: qty 2

## 2020-11-29 MED ORDER — MAGNESIUM SULFATE 2 GM/50ML IV SOLN
2.0000 g | Freq: Once | INTRAVENOUS | Status: AC
  Administered 2020-11-29: 2 g via INTRAVENOUS
  Filled 2020-11-29: qty 50

## 2020-11-29 MED ORDER — ISOSORBIDE MONONITRATE ER 30 MG PO TB24
15.0000 mg | ORAL_TABLET | Freq: Every day | ORAL | Status: DC
  Administered 2020-11-29: 15 mg via ORAL
  Filled 2020-11-29: qty 1

## 2020-11-29 MED ORDER — DAPAGLIFLOZIN PROPANEDIOL 10 MG PO TABS
10.0000 mg | ORAL_TABLET | Freq: Every day | ORAL | Status: DC
  Administered 2020-11-29: 10 mg via ORAL
  Filled 2020-11-29: qty 1

## 2020-11-29 MED ORDER — FUROSEMIDE 10 MG/ML IJ SOLN: 80.0000 mg | Freq: Two times a day (BID) | INTRAMUSCULAR | Status: DC

## 2020-11-29 MED ORDER — MIDODRINE HCL 5 MG PO TABS
5.0000 mg | ORAL_TABLET | Freq: Three times a day (TID) | ORAL | Status: DC
  Administered 2020-11-29 – 2020-12-04 (×10): 5 mg via ORAL
  Filled 2020-11-29 (×10): qty 1

## 2020-11-29 MED ORDER — SPIRONOLACTONE 12.5 MG HALF TABLET
12.5000 mg | ORAL_TABLET | Freq: Every day | ORAL | Status: DC
  Administered 2020-11-29: 12.5 mg via ORAL
  Filled 2020-11-29: qty 1

## 2020-11-29 NOTE — Progress Notes (Signed)
ANTICOAGULATION CONSULT NOTE - Follow Up Consult  Pharmacy Consult for heparin Indication: chest pain/ACS  Allergies  Allergen Reactions   Iodinated Diagnostic Agents Nausea And Vomiting   Shellfish Allergy Nausea And Vomiting    Patient Measurements: Height: 5\' 7"  (170.2 cm) Weight: 103 kg (227 lb 1.2 oz) IBW/kg (Calculated) : 61.6 Heparin Dosing Weight: 83 kg  Vital Signs: Temp: 99.7 F (37.6 C) (10/31 0000) Temp Source: Oral (10/31 0000) BP: 109/63 (10/31 0700) Pulse Rate: 94 (10/31 0700)  Labs: Recent Labs    11/27/20 1859 11/27/20 1900 11/27/20 1918 11/27/20 2101 11/27/20 2246 11/28/20 0040 11/28/20 0611 11/28/20 1331 11/29/20 0058  HGB 9.6*  --   --  9.2*  --  9.6*  --   --  8.4*  HCT 30.2*  --   --  27.0*  --  30.5*  --   --  26.4*  PLT 238  --   --   --   --  226  --   --  203  APTT  --   --  >200*  --   --   --   --   --   --   LABPROT  --   --  15.1  --   --   --   --   --   --   INR  --   --  1.2  --   --   --   --   --   --   HEPARINUNFRC  --   --   --   --   --   --   --  0.49 0.68  CREATININE 1.11*  --   --   --   --   --  1.07*  --  1.18*  TROPONINIHS  --  148*  --   --  1,249*  --   --   --   --      Estimated Creatinine Clearance: 50.9 mL/min (A) (by C-G formula based on SCr of 1.18 mg/dL (H)).   Medical History: Past Medical History:  Diagnosis Date   Arthritis    Coronary artery disease    Diabetes mellitus without complication (HCC)    DVT (deep venous thrombosis) (HCC)    Heart attack (HCC)    Hyperlipidemia    Hypertension    Stroke [facility])       Assessment: 75 yo F with STEMI s/p heart cath. No known anticoagulation prior to admission. Pharmacy consulted to start heparin 8 hr after sheath removal.   Patient with small vessel CAD and incomplete occlusions. Patient unable to lie flat for stent placement due to volume overload. Plan to diurese, obtain echo then return to cath lab for PCI.  Heparin drip 1150 units/hr Heparin level  0.68 at goal and CBC ok Asa,clopidogrel and atorvastatin started  EF 30% losartan and spironolactone started - titrate medications as BP and Cr allow.   No signs of bleeding per RN.  Goal of Therapy:  Heparin level 0.3-0.7 units/ml Monitor platelets by anticoagulation protocol: Yes   Plan:  Decrease heparin to 1100 units/hr to keep in range Monitor daily HL, CBC/plt Monitor for signs/symptoms of bleeding  F/u PCI plan   60, PharmD PGY2 Cardiology Pharmacy Resident Phone: [phone] 11/29/2020  7:26 AM  Please check [email or web address] for unit-specific pharmacy phone numbers.

## 2020-11-29 NOTE — Progress Notes (Addendum)
Progress Note  Patient Name: Shaundra Fullam Date of Encounter: 11/29/2020  [facility] Cardiologist: Dr Lynnette Caffey  Subjective   Denies chest pain at any point during this illness.  Worsening shortness of breath for 3 to 5 days prior to admission.  Had been chronically short of breath for quite some while.  Bed to chair existence at home.  Unable to afford insulin at home.  Laboratory data demonstrates slowly downward trending hemoglobin on IV heparin.  This a.m. hemoglobin is 8.6.  Inpatient Medications    Scheduled Meds:  aspirin  81 mg Oral Daily   atorvastatin  80 mg Oral Daily   Chlorhexidine Gluconate Cloth  6 each Topical Daily   clopidogrel  75 mg Oral Daily   furosemide  80 mg Intravenous BID   insulin aspart  0-15 Units Subcutaneous TID WC   insulin aspart  0-5 Units Subcutaneous QHS   losartan  25 mg Oral Daily   mouth rinse  15 mL Mouth Rinse BID   mupirocin ointment  1 application Nasal BID   sodium chloride flush  3 mL Intravenous Q12H   spironolactone  25 mg Oral Daily   Continuous Infusions:  sodium chloride 20 mL/hr at 11/29/20 0700   sodium chloride     heparin 1,100 Units/hr (11/29/20 0800)   magnesium sulfate bolus IVPB 2 g (11/29/20 0759)   PRN Meds: sodium chloride, acetaminophen, ondansetron (ZOFRAN) IV, sodium chloride flush   Vital Signs    Vitals:   11/29/20 0500 11/29/20 0600 11/29/20 0700 11/29/20 0731  BP: (!) 94/48 (!) 110/59 109/63   Pulse: 93 99 94   Resp: 15 (!) 21 (!) 27   Temp:    98.9 F (37.2 C)  TempSrc:    Oral  SpO2: 98% 97% 100%   Weight:      Height:        Intake/Output Summary (Last 24 hours) at 11/29/2020 0828 Last data filed at 11/29/2020 0700 Gross per 24 hour  Intake 1323.77 ml  Output 2900 ml  Net -1576.23 ml   Last 3 Weights 11/28/2020 11/27/2020 10/28/2018  Weight (lbs) 227 lb 1.2 oz 220 lb 220 lb  Weight (kg) 103 kg 99.791 kg 99.791 kg      Telemetry    NSR, PVCs; 5 and 14 beat runs NSVT -  Personally Reviewed   Physical Exam   GEN: No acute distress.   Neck: supple Cardiac: RRR Respiratory: Clear to auscultation bilaterally. GI: Soft, nontender, non-distended  MS: 2 + edema; radial cath site with no hematoma Neuro:  Nonfocal  Psych: Normal affect   Labs    High Sensitivity Troponin:   Recent Labs  Lab 11/27/20 1900 11/27/20 2246  TROPONINIHS 148* 1,249*     Chemistry Recent Labs  Lab 11/27/20 1859 11/27/20 2101 11/28/20 0611 11/29/20 0058  NA 137 140 137 134*  K 3.9 3.8 3.7 3.8  CL 103  --  103 99  CO2 24  --  27 24  GLUCOSE 204*  --  180* 133*  BUN 25*  --  20 19  CREATININE 1.11*  --  1.07* 1.18*  CALCIUM 9.2  --  8.7* 8.6*  MG  --   --   --  1.8  PROT 7.9  --   --   --   ALBUMIN 3.5  --   --   --   AST 23  --   --   --   ALT 21  --   --   --  ALKPHOS 44  --   --   --   BILITOT 0.4  --   --   --   GFRNONAA 52*  --  54* 48*  ANIONGAP 10  --  7 11    Lipids  Recent Labs  Lab 11/27/20 1859  CHOL 155  TRIG 94  HDL 73  LDLCALC NOT CALCULATED  CHOLHDL 2.1    Hematology Recent Labs  Lab 11/27/20 1859 11/27/20 2101 11/28/20 0040 11/29/20 0058  WBC 7.1  --  7.8 9.5  RBC 3.32*  --  3.31* 2.91*  HGB 9.6* 9.2* 9.6* 8.4*  HCT 30.2* 27.0* 30.5* 26.4*  MCV 91.0  --  92.1 90.7  MCH 28.9  --  29.0 28.9  MCHC 31.8  --  31.5 31.8  RDW 15.6*  --  15.7* 15.5  PLT 238  --  226 203       Radiology    CARDIAC CATH 11/27/2020: Diagnostic Dominance: Right     CARDIAC CATHETERIZATION  Addendum Date: 11/27/2020     Prox RCA lesion is 90% stenosed.   Mid RCA to Dist RCA lesion is 100% stenosed.   Dist LM lesion is 20% stenosed.   Prox Cx lesion is 80% stenosed.   Prox LAD lesion is 70% stenosed.   Mid LAD lesion is 50% stenosed.   Dist LAD lesion is 80% stenosed.   1st Mrg lesion is 60% stenosed.   LV end diastolic pressure is severely elevated. 1.  Severe native vessel disease with high-grade disease of the small mid right coronary  artery with a distal occlusion; high-grade disease of the proximal left circumflex, and moderate disease of the proximal LAD and distal LAD.  The ostial LAD and left main do not appear to be severely diseased. 2.  Elevated LVEDP of 26 mmHg; the patient received 80 mg of intravenous lasix in total in the cardiac catheterization laboratory. 3.  Given that the patient was having no antecedent chest pain and had no acute occlusions on this study, the patient will be admitted and optimized from a heart failure standpoint.  A decision regarding medical therapy of coronary artery disease versus PCI of the left circumflex and LAD later during this admission will be made.   Result Date: 11/27/2020   Prox RCA lesion is 90% stenosed.   Mid RCA to Dist RCA lesion is 100% stenosed.   Dist LM lesion is 20% stenosed.   Prox Cx lesion is 80% stenosed.   Prox LAD lesion is 70% stenosed.   Mid LAD lesion is 50% stenosed.   Dist LAD lesion is 80% stenosed.   1st Mrg lesion is 60% stenosed.   LV end diastolic pressure is severely elevated. 1.  Severe native vessel disease with high-grade disease of the small mid right coronary artery with a distal occlusion; high-grade disease of the proximal left circumflex, and moderate disease of the proximal LAD and distal LAD.  The ostial LAD and left main do not appear to be severely diseased. 2.  Elevated LVEDP of 26 mmHg; the patient received 80 mg of Lasix in total in the cardiac catheterization laboratory. 3.  Given that the patient was having no chest pain and had no acute occlusions, the patient will be optimized from a heart failure standpoint with planned PCI of the left circumflex and LAD later during this admission. Recommendation: Resume the patient's home aspirin and Plavix, continue a heparin drip, and diuresis.  Will obtain echocardiogram.   DG Chest Portable  1 View  Result Date: 11/27/2020 CLINICAL DATA:  Worsening shortness of breath. EXAM: PORTABLE CHEST 1 VIEW  COMPARISON:  March 19, 2015 FINDINGS: Mild diffuse chronic appearing increased interstitial lung markings are seen. Mild areas of atelectasis and/or infiltrate are seen within the bilateral lung bases. There is no evidence of a pleural effusion or pneumothorax. The cardiac silhouette is borderline in size. The visualized skeletal structures are unremarkable. IMPRESSION: Mild diffuse chronic appearing increased interstitial lung markings with mild bibasilar atelectasis and/or infiltrate. Electronically Signed   By: Virgina Norfolk M.D.   On: 11/27/2020 19:15   ECHOCARDIOGRAM COMPLETE  Result Date: 11/28/2020    ECHOCARDIOGRAM REPORT   Patient Name:   SARALYNN TORSIELLO Date of Exam: 11/28/2020 Medical Rec #:  [id number]      Height:       67.0 in Accession #:    [id number]     Weight:       227.1 lb Date of Birth:  01/06/1946     BSA:          2.134 m Patient Age:    76 years       BP:           106/48 mmHg Patient Gender: F              HR:           79 bpm. Exam Location:  Inpatient Procedure: 2D Echo, Color Doppler, Limited Color Doppler and Intracardiac            Opacification Agent Indications:    MI  History:        Patient has no prior history of Echocardiogram examinations.                 Acute MI, Signs/Symptoms:Shortness of Breath; Risk                 Factors:Hypertension and Dyslipidemia.  Sonographer:    Ula Lingo RDCS (AE, PE) Referring Phys: [id number] [place]  1. Akinesis of the inferolateral wall, distal inferior wall and apex; overall moderate LV dysfunction.  2. Left ventricular ejection fraction, by estimation, is 35 to 40%. The left ventricle has moderately decreased function. The left ventricle demonstrates regional wall motion abnormalities (see scoring diagram/findings for description). Left ventricular  diastolic parameters are consistent with Grade III diastolic dysfunction (restrictive). Elevated left atrial pressure.  3. Right ventricular systolic function is  normal. The right ventricular size is normal.  4. Left atrial size was moderately dilated.  5. Moderate pleural effusion in the left lateral region.  6. The mitral valve is normal in structure. Mild mitral valve regurgitation. No evidence of mitral stenosis.  7. The aortic valve is tricuspid. Aortic valve regurgitation is not visualized. Mild aortic valve sclerosis is present, with no evidence of aortic valve stenosis.  8. The inferior vena cava is normal in size with greater than 50% respiratory variability, suggesting right atrial pressure of 3 mmHg. Comparison(s): No prior Echocardiogram. FINDINGS  Left Ventricle: Left ventricular ejection fraction, by estimation, is 35 to 40%. The left ventricle has moderately decreased function. The left ventricle demonstrates regional wall motion abnormalities. The left ventricular internal cavity size was normal in size. There is no left ventricular hypertrophy. Left ventricular diastolic parameters are consistent with Grade III diastolic dysfunction (restrictive). Elevated left atrial pressure. Right Ventricle: The right ventricular size is normal. Right ventricular systolic function is normal. Left Atrium: Left atrial size was moderately dilated. Right Atrium:  Right atrial size was normal in size. Pericardium: There is no evidence of pericardial effusion. Mitral Valve: The mitral valve is normal in structure. Mild mitral valve regurgitation. No evidence of mitral valve stenosis. Tricuspid Valve: The tricuspid valve is normal in structure. Tricuspid valve regurgitation is trivial. No evidence of tricuspid stenosis. Aortic Valve: The aortic valve is tricuspid. Aortic valve regurgitation is not visualized. Mild aortic valve sclerosis is present, with no evidence of aortic valve stenosis. Pulmonic Valve: The pulmonic valve was normal in structure. Pulmonic valve regurgitation is trivial. No evidence of pulmonic stenosis. Aorta: The aortic root is normal in size and structure.  Venous: The inferior vena cava is normal in size with greater than 50% respiratory variability, suggesting right atrial pressure of 3 mmHg. IAS/Shunts: No atrial level shunt detected by color flow Doppler. Additional Comments: Akinesis of the inferolateral wall, distal inferior wall and apex; overall moderate LV dysfunction. There is a moderate pleural effusion in the left lateral region.  LEFT VENTRICLE PLAX 2D LVIDd:         5.00 cm   Diastology LVIDs:         4.00 cm   LV e' medial:   5.84 cm/s LV PW:         1.10 cm   LV E/e' medial: 22.9 LV IVS:        1.10 cm LVOT diam:     2.10 cm LV SV:         62 LV SV Index:   29 LVOT Area:     3.46 cm  RIGHT VENTRICLE RV S prime:     8.31 cm/s TAPSE (M-mode): 1.8 cm LEFT ATRIUM             Index        RIGHT ATRIUM           Index LA diam:        4.10 cm 1.92 cm/m   RA Area:     14.40 cm LA Vol (A2C):   84.1 ml 39.40 ml/m  RA Volume:   32.00 ml  14.99 ml/m LA Vol (A4C):   83.0 ml 38.89 ml/m LA Biplane Vol: 87.2 ml 40.86 ml/m  AORTIC VALVE LVOT Vmax:   93.00 cm/s LVOT Vmean:  66.500 cm/s LVOT VTI:    0.179 m  AORTA Ao Root diam: 3.10 cm Ao Asc diam:  3.00 cm MITRAL VALVE                TRICUSPID VALVE MV Area (PHT): 5.38 cm     TR Peak grad:   27.2 mmHg MV Decel Time: 141 msec     TR Vmax:        261.00 cm/s MV E velocity: 134.00 cm/s MV A velocity: 58.20 cm/s   SHUNTS MV E/A ratio:  2.30         Systemic VTI:  0.18 m                             Systemic Diam: 2.10 cm Kirk Ruths MD Electronically signed by Kirk Ruths MD Signature Date/Time: 11/28/2020/11:09:49 AM    Final      Patient Profile     75 y.o. female with past medical history CAD, diabetes mellitus II, hypertension, hyperlipidemia, peripheral vascular disease admitted with possible acute ST elevation myocardial infarction.  Emergent cardiac catheterization revealed 90% proximal RCA, occluded distal RCA, 20% left main, 80% proximal circumflex, 70%  proximal LAD, 50% mid LAD, 80% distal LAD,  60% OM1 and elevated left ventricular end-diastolic pressure of 26 mmHg.  Assessment & Plan    Multivessel CAD with non-ST elevation MI: Severe diffuse CAD.  Aggressive preventive therapy.  Hopefully medical therapy will suffice.  Would be difficult CABG or PCI candidate.  We will discuss prognosis with family and patient. Acute systolic congestive heart failure-predominant issue at time of admission was dyspnea and lower extremity edema.  LVEDP 26 at time of catheterization.  With appropriate management of acute on chronic systolic heart failure, heart failure symptoms should improve.  Needs guideline directed therapy titrated as tolerated by blood pressure and kidney function after appropriate diuresis with furosemide and MRA.  Need to watch kidney function closely.  Add long-acting nitrate if tolerated by blood pressure. Hypertension-blood pressure is controlled this morning.  We will add CHF medications as outlined above based on echocardiogram results. Hyperlipidemia-continue statin. Normocytic anemia-we will need further evaluation once CHF improves.  This is a concerning poor prognostic indicator.  DC IV heparin and start subcu Lovenox for DVT prophylaxis.  Will need to watch closely today.  Low-dose beta-blocker, Farxiga, and isosorbide mononitrate are being added.  Lasix been increased to every 8 hours to get better diuresis.  Beta-blocker and isosorbide should be held if systolic pressure less than [id number] mmHg systolic.  Discuss goals of care with the patient and family including son, Flint Melter, who wants to be the one notified if changes.  I did discuss CODE STATUS and advanced life support.  The patient is undecided but has negative feelings towards aggressive end-of-life measures.  She and family will have discussion.  The patient and family were updated on overall clinical condition.  She has coronary disease that is not intervene able relative to the chronic total occlusions.  Other diseases  diffusely diseased and not likely to give good percutaneous result.  For questions or updates, please contact [place] Please consult [email or web address] for contact info under        Signed, Sinclair Grooms, MD  11/29/2020, 8:28 AM

## 2020-11-29 NOTE — Consult Note (Addendum)
Advanced Heart Failure Team Consult Note   Primary Physician: Andreas Blower., MD PCP-Cardiologist:  None  Reason for Consultation: Hypotension, acute systolic HF  HPI:    Brittney [name] is seen today for evaluation of hypotension in setting of acute systolic HF at the request of Dr. Katrinka Blazing with Cardiology.   This is a 75 y.o. female with history of CAD with prior MI and stenting in 2004 per chart review, HTN, dyslipidemia, DM, hx CVA, PAD with hx CLI s/p Viabahn covered stent left SFA, stent left TP trunk, PTA left posterior tibial artery 09/20. Presented on 11/27/20 with dyspnea and chest pain. Initially Code STEMI activate. ECG demonstrated STE but in non-contiguous leads. Due to ongoing symptoms, referred for urgent LHC which demonstrated severe 3 vessel CAD. No acute occlusion. LVEDP 26 mmHg. Given 80 mg lasix IV. Admitted for optimization of HF. Plan to decide on medical management versus PCI of Lcx or LAD later in course.   Echo 10/30: LVEF 35-40%, akinesis of inferolateral wall, distal inferior wall and apex, RV okay, mild MR  Has gotten 3 doses 80 mg lasix IV since admit. Attempted combination of low dose toprol xl, imdur, losartan, spiro.  Systolic blood pressure dropped to 75/40 mmhg about 2hrs later. Given 250 cc bolus NS with some improvement. MAPs now 60-65.  Labs significant for: Scr 1.36, C02 28, K 4.2, Na 136, Hgb 9.6 > 8.4 (hgb 10.6 )7/22), A1c 7.3, HS troponin 148 > 1249  Denies CP but notes intermittent waves of difficulty breathing.  Daughter reports she has been declining over the last couple of months. She lives with 2 grandchildren. Family worries she could not get up at home if falls. Uses cane or wheelchair.  Review of Systems: [y] = yes, [ ]  = no   General: Weight gain [ ] ; Weight loss [ ] ; Anorexia [ ] ; Fatigue [Y]; Fever [ ] ; Chills [ ] ; Weakness [ ]   Cardiac: Chest pain/pressure [ ] ; Resting SOB [Y]; Exertional SOB [ ] ; Orthopnea [ ] ; Pedal Edema [ ] ;  Palpitations [ ] ; Syncope [ ] ; Presyncope [ ] ; Paroxysmal nocturnal dyspnea[ ]   Pulmonary: Cough [ ] ; Wheezing[ ] ; Hemoptysis[ ] ; Sputum [ ] ; Snoring [ ]   GI: Vomiting[ ] ; Dysphagia[ ] ; Melena[ ] ; Hematochezia [ ] ; Heartburn[ ] ; Abdominal pain [ ] ; Constipation [ ] ; Diarrhea [ ] ; BRBPR [ ]   GU: Hematuria[ ] ; Dysuria [ ] ; Nocturia[ ]   Vascular: Pain in legs with walking [ ] ; Pain in feet with lying flat [ ] ; Non-healing sores [ ] ; Stroke [Y]; TIA [ ] ; Slurred speech [ ] ;  Neuro: Headaches[ ] ; Vertigo[ ] ; Seizures[ ] ; Paresthesias[ ] ;Blurred vision [ ] ; Diplopia [ ] ; Vision changes [ ]   Ortho/Skin: Arthritis [ ] ; Joint pain [ ] ; Muscle pain [ ] ; Joint swelling [ ] ; Back Pain [ ] ; Rash [ ]   Psych: Depression[ ] ; Anxiety[ ]   Heme: Bleeding problems [ ] ; Clotting disorders [ ] ; Anemia [Y]  Endocrine: Diabetes [Y]; Thyroid dysfunction[ ]   Home Medications Prior to Admission medications   Medication Sig Start Date End Date Taking? Authorizing Provider  amLODipine (NORVASC) 10 MG tablet Take 10 mg by mouth daily.   Yes [provider]  aspirin 81 MG tablet Take 81 mg by mouth daily.   Yes [provider]  atorvastatin (LIPITOR) 40 MG tablet Take 40 mg by mouth at bedtime.    Yes [provider]  clopidogrel (PLAVIX) 75 MG tablet Take 75 mg by mouth  daily.   Yes [provider]  furosemide (LASIX) 40 MG tablet Take 40 mg by mouth daily.   Yes [provider]  gabapentin (NEURONTIN) 300 MG capsule Take 600 mg by mouth 3 (three) times daily.    Yes [provider]  hydrALAZINE (APRESOLINE) 25 MG tablet Take 25 mg by mouth 3 (three) times daily.   Yes [provider]  hydrOXYzine (ATARAX/VISTARIL) 10 MG tablet Take 10 mg by mouth 3 (three) times daily as needed for itching. 10/25/20  Yes [provider]  metFORMIN (GLUCOPHAGE) 1000 MG tablet Take 1,000 mg by mouth 2 (two) times daily with a meal.   Yes [provider]   metoprolol succinate (TOPROL-XL) 100 MG 24 hr tablet Take 100 mg by mouth at bedtime. Take with or immediately following a meal.    Yes [provider]  Omega-3 Fatty Acids (FISH OIL) 1000 MG CAPS Take 1,000 mg by mouth 2 (two) times daily.   Yes [provider]  pioglitazone (ACTOS) 30 MG tablet Take 30 mg by mouth daily.  09/18/18  Yes [provider]  triamcinolone lotion (KENALOG) 0.1 % Apply 1 application topically 2 (two) times daily. 10/25/20  Yes [provider]  insulin glargine (LANTUS) 100 UNIT/ML injection Inject 60 Units into the skin daily.  Patient not taking: Reported on 11/28/2020    [provider]  silver sulfADIAZINE (SILVADENE) 1 % cream Apply 1 application topically daily. Apply to toe Patient not taking: Reported on 11/28/2020 09/05/18   [provider]    Past Medical History: Past Medical History:  Diagnosis Date   Arthritis    Coronary artery disease    Diabetes mellitus without complication ([place])    DVT (deep venous thrombosis) ([place])    Heart attack ([place])    Hyperlipidemia    Hypertension    Stroke [facility])     Past Surgical History: Past Surgical History:  Procedure Laterality Date   ABDOMINAL AORTOGRAM W/LOWER EXTREMITY Bilateral 10/28/2018   Procedure: ABDOMINAL AORTOGRAM W/LOWER EXTREMITY;  Surgeon: Waynetta Sandy, MD;  Location: [facility];  Service: Cardiovascular;  Laterality: Bilateral;   CATARACT EXTRACTION     CESAREAN SECTION     CORONARY ANGIOPLASTY WITH STENT PLACEMENT     LEFT HEART CATH AND CORONARY ANGIOGRAPHY N/A 11/27/2020   Procedure: LEFT HEART CATH AND CORONARY ANGIOGRAPHY;  Surgeon: Early Osmond, MD;  Location: [facility];  Service: Cardiovascular;  Laterality: N/A;   PERIPHERAL VASCULAR INTERVENTION  10/28/2018   Procedure: PERIPHERAL VASCULAR INTERVENTION;  Surgeon: Waynetta Sandy, MD;  Location: [facility];  Service: Cardiovascular;;     Family History: No family history on file.  Social History: Social History   Socioeconomic History   Marital status: Married    Spouse name: Not on file   Number of children: Not on file   Years of education: Not on file   Highest education level: Not on file  Occupational History   Not on file  Tobacco Use   Smoking status: Never   Smokeless tobacco: Never  Vaping Use   Vaping Use: Never used  Substance and Sexual Activity   Alcohol use: No   Drug use: No   Sexual activity: Not on file  Other Topics Concern   Not on file  Social History Narrative   Not on file   Social Determinants of Health   Financial Resource Strain: Not on file  Food Insecurity: Not on  file  Transportation Needs: Not on file  Physical Activity: Not on file  Stress: Not on file  Social Connections: Not on file    Allergies:  Allergies  Allergen Reactions   Iodinated Diagnostic Agents Nausea And Vomiting   Shellfish Allergy Nausea And Vomiting    Objective:    Vital Signs:   Temp:  [97.8 F (36.6 C)-99.7 F (37.6 C)] 98.3 F (36.8 C) (10/31 1104) Pulse Rate:  [75-99] 78 (10/31 1500) Resp:  [12-37] 22 (10/31 1500) BP: (70-119)/(42-93) 83/50 (10/31 1500) SpO2:  [95 %-100 %] 99 % (10/31 1500)    Weight change: Filed Weights   11/27/20 1838 11/28/20 0630  Weight: 99.8 kg 103 kg    Intake/Output:   Intake/Output Summary (Last 24 hours) at 11/29/2020 1546 Last data filed at 11/29/2020 1500 Gross per 24 hour  Intake 1478.56 ml  Output 1850 ml  Net -371.44 ml      Physical Exam    General:  Sitting up in bed eating dinner. No distress. HEENT: dentition in poor repair Neck: + JVD. Carotids 2+ bilat; no bruits. No lymphadenopathy or thyromegaly appreciated. Cor: PMI nondisplaced. Regular rate & rhythm. No rubs, gallops or murmurs. Lungs: clear Abdomen: soft, nontender, nondistended. No hepatosplenomegaly. No bruits or masses. Good bowel sounds. Extremities: no  cyanosis, clubbing, rash, 1-2 + LE edema Neuro: alert & orientedx3, cranial nerves grossly intact. moves all 4 extremities w/o difficulty. Affect pleasant   Telemetry   SR 80s (personally reviewed)  EKG    Presenting ECG: SR 96 bpm, ST elevation in AVR and III, ST depression AVL and I  Labs   Basic Metabolic Panel: Recent Labs  Lab 11/27/20 1859 11/27/20 2101 11/28/20 0611 11/29/20 0058 11/29/20 1404  NA 137 140 137 134* 136  K 3.9 3.8 3.7 3.8 4.2  CL 103  --  103 99 100  CO2 24  --  27 24 28   GLUCOSE 204*  --  180* 133* 216*  BUN 25*  --  20 19 20   CREATININE 1.11*  --  1.07* 1.18* 1.36*  CALCIUM 9.2  --  8.7* 8.6* 8.6*  MG  --   --   --  1.8 2.4    Liver Function Tests: Recent Labs  Lab 11/27/20 1859  AST 23  ALT 21  ALKPHOS 44  BILITOT 0.4  PROT 7.9  ALBUMIN 3.5   No results for input(s): LIPASE, AMYLASE in the last 168 hours. No results for input(s): AMMONIA in the last 168 hours.  CBC: Recent Labs  Lab 11/27/20 1859 11/27/20 2101 11/28/20 0040 11/29/20 0058  WBC 7.1  --  7.8 9.5  NEUTROABS 4.2  --   --   --   HGB 9.6* 9.2* 9.6* 8.4*  HCT 30.2* 27.0* 30.5* 26.4*  MCV 91.0  --  92.1 90.7  PLT 238  --  226 203    Cardiac Enzymes: No results for input(s): CKTOTAL, CKMB, CKMBINDEX, TROPONINI in the last 168 hours.  BNP: BNP (last 3 results) No results for input(s): BNP in the last 8760 hours.  ProBNP (last 3 results) No results for input(s): PROBNP in the last 8760 hours.   CBG: Recent Labs  Lab 11/27/20 2157 11/28/20 1346 11/28/20 2119 11/29/20 0640 11/29/20 1100  GLUCAP 206* 93 165* 149* 151*    Coagulation Studies: Recent Labs    11/27/20 1918  LABPROT 15.1  INR 1.2     Imaging   No results found.   Medications:  Current Medications:  aspirin  81 mg Oral Daily   atorvastatin  80 mg Oral Daily   Chlorhexidine Gluconate Cloth  6 each Topical Daily   clopidogrel  75 mg Oral Daily   dapagliflozin propanediol   10 mg Oral Daily   enoxaparin (LOVENOX) injection  60 mg Subcutaneous Q24H   furosemide  80 mg Intravenous Q12H   insulin aspart  0-15 Units Subcutaneous TID WC   insulin aspart  0-5 Units Subcutaneous QHS   mouth rinse  15 mL Mouth Rinse BID   metoprolol succinate  12.5 mg Oral Daily   mupirocin ointment  1 application Nasal BID   sodium chloride flush  3 mL Intravenous Q12H    Infusions:  sodium chloride 20 mL/hr at 11/29/20 1500   sodium chloride        Patient Profile   75 y.o. female with history of CAD s/p prior MI in 2004 (details not known), PAD, DM, HTN, HLD. Now admitted with NSTEMI c/b acute systolic HF  Assessment/Plan   Acute systolic HF: -2/2 NSTEMI -LVEDP 26 mmHg at time of LHC -Echo LVEF 35-40%, RV okay -Appears volume up. No weight today. Has put out around 1L since IV lasix this am per RN. Scr 1.1 > 1.36, monitor (baseline seems to be around 1), will likely climb after hypertensive episode. Hold further diuresis for now.  -Place PICC line, monitor co-ox and CVP.  -Developed severe hypotension this am after medications. Given 250 cc bolus NS. MAPs now low 60s. Goal MAP > 70. -Start midodrine 5 mg TID.  -Ted hose -Hold all HF medications for now. Add back as able.   2. NSTEMI: -Known history of CAD with prior MI in 2004 in [place] (details not known) -Severe diffuse 3 V CAD on LHC this admit.  -Managing medically for now. If continues to have significant dyspnea once diuresed may consider intervening on pLCx. However, this may be very high risk, lesion extends into LM. Would need to discuss with interventionalist. -HS troponin up to 1,249 -ASA + plavix -Heparin stopped d/t hemoglobin 8.4 -Continue Atorvastatin 80. LDL 60  3. Type II DM: -A1c 7.3 -Farxiga -SSI  4. PAD: -Hx Viabahn covered stent left SFA, stent left TP trunk, PTA left posterior tibial artery 09/20 d/t CLI - ASA + statin  5. Anemia: -Hgb 9.6 > 8.4, Hgb baseline has been in 10s on  chart review -check iron stores -No obvious source   Length of Stay: 2  FINCH, LINDSAY N, PA-C  11/29/2020, 3:46 PM  Advanced Heart Failure Team Pager [phone] (M-F; 7a - 5p)  Please contact [facility] for night-coverage after hours (4p -7a ) and weekends on [email or web address]   Agree with above.  75 y/o woman  DM2, CAD and PAD. At least 6 month h/o of worsening functional status which ahs limited ADLs. Admitted with NSTEMI and acute HF. Cath with 3v CAD including occluded RCA and high-grade ostial/proximal stenosis in moderate-sized LCx. LVEDP markedly elevated. Treated  medically as no acute culprit and PCI LCX would be extremely high risk. Has improved some with diuresis but still SOB.  EF ~40%. GDMT initiated and patient developed severe hypotension.   General:  Elderly weak appearing. No resp difficulty HEENT: normal Neck: supple. JVP 9 Carotids 2+ bilat; no bruits. No lymphadenopathy or thryomegaly appreciated. Cor: PMI nondisplaced. Regular rate & rhythm. No rubs, gallops or murmurs. Lungs: crackles at bases Abdomen: obese soft, nontender, nondistended. No hepatosplenomegaly. No bruits or masses. Good bowel  sounds. Extremities: no cyanosis, clubbing, rash, 2+ edema Neuro: alert & orientedx3, cranial nerves grossly intact. moves all 4 extremities w/o difficulty. Affect pleasant  Difficult situation. Functional status markedly limited at baseline. Unclear if HF led to NSTEMI or vice versa. That said, I reviewed cath films with Dr. Ali Lowe and we agree that PCI of ostial LCx would be very high-risk with potential plaque shift into LM and LAD.   For now I think the best plan is supportive care with midodrine and potentially NE to support BP and to place central access to better manage output and volume status. If not improving with medical therapy may need to consider [place] discussions I the next few days. Family at bedside during discussion.   CRITICAL CARE Performed by: Glori Bickers  Total critical care time: 55 minutes  Critical care time was exclusive of separately billable procedures and treating other patients.  Critical care was necessary to treat or prevent imminent or life-threatening deterioration.  Critical care was time spent personally by me (independent of midlevel providers or residents) on the following activities: development of treatment plan with patient and/or surrogate as well as nursing, discussions with consultants, evaluation of patient's response to treatment, examination of patient, obtaining history from patient or surrogate, ordering and performing treatments and interventions, ordering and review of laboratory studies, ordering and review of radiographic studies, pulse oximetry and re-evaluation of patient's condition.  Glori Bickers, MD  10:56 PM

## 2020-11-29 NOTE — Progress Notes (Signed)
PICC placement ordered for CVP/Coox monitoring. 2 PICC Team RNs at bedside to assess patient vessels for appropriateness of PICC. Right arm assessed using ultrasound, no basilic vein visualized, brachial vein of small size, so cephalic vein chosen with measurement of 12% vessel occupancy.   This RN proceeded with PICC placement per policy using right arm cephalic vein. Venous access with needle was established then guidewire advanced through the needle with ease. After threading guidewire approximately 15cm, patient stated that she "felt something." This RN then attempted to retract guidewire to reposition and alleviate discomfort but guidewire will not move. Ultrasound utilized to confirm that placement of needle and guidewire remain in the vessel. Both visualized, but cephalic vessel now with significant vasoconstriction proximal to insertion site. This RN again attempted to retract guidewire unsuccessfully. Then tried to remove needle and guidewire together, and needle removed freely with ease, but guidewire remained in place.   PICC RNs placed warm compresses on arm/shoulder proximal to insertion site, underneath sterile drape to promote vasodilation. After allowing warm compress to sit for approximately , guidewire still remains in place, unable to be retracted. Continued further warm compress massages above site, and added warm cloth for patient's hand. Guidewire still remain in place. Primary RN made aware of difficulties, and paged MD.  Peppermint oil given for patient to smell, and massaged into skin proximal to insertion site. Dr. Launa Grill at bedside now after approximately total of attempting to retract wire. Peppermint oil allowed to sit for 10-15 minutes, and vessel dilated enough guidewire slide out with ease. MD states to not pursue PICC placement any further at this time.   Arm remains soft, no swelling, or complications noted. Gauze applied to puncture site. Patient informed  that unable to get PICC placed. Primary RN aware that MD needed to place CVC if central access still necessary at this time.

## 2020-11-29 NOTE — TOC Benefit Eligibility Note (Addendum)
Patient Product/process development scientist completed.    The patient is currently admitted and upon discharge could be taking Jardiance 10 mg.  The current 30 day co-pay is, $47.00.   The patient is currently admitted and upon discharge could be taking Farxiga 10 mg.  The current 30 day co-pay is, $47.00.   The patient is currently admitted and upon discharge could be taking Entresto 24-26 mg.  The current 30 day co-pay is, $47.00.   The patient is currently admitted and upon discharge could be taking Lantus Pen  The current 30 day co-pay is, $35.00.   The patient is insured through [employer]     Roland Earl, CPhT Pharmacy Patient Advocate Specialist [facility] Patient Advocate Team Direct Number: [phone]  Fax: [phone]

## 2020-11-29 NOTE — Evaluation (Signed)
Physical Therapy Evaluation Patient Details Name: Brittney [name] MRN: [id number] DOB: 1945-10-30 Today's Date: 11/29/2020  History of Present Illness  The pt is a 75 yo female presenting 10/29 with worsening SOB and chest tightness, pt found to have multi-vessel CAD with NSTEMI. Not a surgical candidate at this time, plan for medical management. PMH includes: CAD, DM II, DTV, HLD, HTN, and CVA.   Clinical Impression  Pt in bed upon arrival of PT, agreeable to evaluation at this time. Prior to admission the pt was mobilizing with use of SPC in the home and in the community, and was independent with ADLs. The pt reports that due to recent decline she has required increased time and effort with ADLs, but was still able to complete without assist. The pt now presents with limitations in functional mobility, power, strength, and endurance due to above dx, and will continue to benefit from skilled PT to address these deficits. The pt was able to complete bed mobility and sit-stand transfers with minA at this time, but did require increased time, effort, and use of momentum to complete. She was limited to short bout of pivoting steps, and reports significant fatigue after transition to recliner. The pt is also SOB with conversation, and therefore cued to increase rest break between each mobility activity at this time. Will continue to benefit from skilled PT acutely to progress endurance and independence with transfers to allow for greater independence with anticipated d/c home with family support.    Recommendations for follow up therapy are one component of a multi-disciplinary discharge planning process, led by the attending physician.  Recommendations may be updated based on patient status, additional functional criteria and insurance authorization.  Follow Up Recommendations Home health PT    Assistance Recommended at Discharge Intermittent Supervision/Assistance  Functional Status Assessment Patient  has had a recent decline in their functional status and demonstrates the ability to make significant improvements in function in a reasonable and predictable amount of time.  Equipment Recommendations  Rollator (4 wheels);3in1 (PT)    Recommendations for Other Services       Precautions / Restrictions Precautions Precautions: Fall Precaution Comments: watch O2 Restrictions Weight Bearing Restrictions: No      Mobility  Bed Mobility Overal bed mobility: Needs Assistance Bed Mobility: Supine to Sit     Supine to sit: Min assist     General bed mobility comments: minA to complete movement, pt able to initiate movements and scoot without assist    Transfers Overall transfer level: Needs assistance Equipment used: Rolling walker (2 wheels) Transfers: Sit to/from Stand Sit to Stand: Min assist           General transfer comment: minA to power up as pt having difficulty with hip clearance initially. cues for hand placement    Ambulation/Gait Ambulation/Gait assistance: Min assist Gait Distance (Feet): 4 Feet Assistive device: Rolling walker (2 wheels) Gait Pattern/deviations: Step-to pattern;Decreased stride length;Shuffle Gait velocity: decreased Gait velocity interpretation: <1.31 ft/sec, indicative of household ambulator General Gait Details: pt with small steps and heavy reliance on BUE support. pt reports significant fatigue after short bout ambulation     Balance Overall balance assessment: Mild deficits observed, not formally tested                                           Pertinent Vitals/Pain Pain Assessment: No/denies pain  Home Living Family/patient expects to be discharged to:: Private residence Living Arrangements: Other relatives (grandchildren) Available Help at Discharge: Family;Available 24 hours/day Type of Home: House Home Access: Stairs to enter Entrance Stairs-Rails: None Entrance Stairs-Number of Steps: 3   Home  Layout: One level Home Equipment: Agricultural consultant (2 wheels);Cane - single point;Wheelchair - manual      Prior Function Prior Level of Function : Needs assist       Physical Assist : Mobility (physical);ADLs (physical) Mobility (physical): Gait;Stairs ADLs (physical): IADLs Mobility Comments: pt reports until reccently she was independent, but using cane in the home and in community. in last few weeks has needed increased assist, rest breaks, and WC ADLs Comments: pt reports she can complete all ADLs with increased time, assist for IADLs     Hand Dominance   Dominant Hand: Right    Extremity/Trunk Assessment   Upper Extremity Assessment Upper Extremity Assessment: Generalized weakness    Lower Extremity Assessment Lower Extremity Assessment: Generalized weakness    Cervical / Trunk Assessment Cervical / Trunk Assessment: Kyphotic  Communication   Communication: No difficulties (SOB with conversation)  Cognition Arousal/Alertness: Awake/alert Behavior During Therapy: WFL for tasks assessed/performed Overall Cognitive Status: Within Functional Limits for tasks assessed                                 General Comments: pt able to follow all cues and answer questions about home set up and PLOF        General Comments General comments (skin integrity, edema, etc.): limited endurance. 3/4 DOE with conversation.    Exercises     Assessment/Plan    PT Assessment Patient needs continued PT services  PT Problem List Decreased strength;Decreased range of motion;Decreased activity tolerance;Decreased balance;Cardiopulmonary status limiting activity       PT Treatment Interventions DME instruction;Gait training;Stair training;Functional mobility training;Therapeutic activities;Therapeutic exercise;Patient/family education    PT Goals (Current goals can be found in the Care Plan section)  Acute Rehab PT Goals Patient Stated Goal: to be able to walk in her  home independently PT Goal Formulation: With patient Time For Goal Achievement: 12/13/20 Potential to Achieve Goals: Good    Frequency Min 3X/week    AM-PAC PT "6 Clicks" Mobility  Outcome Measure Help needed turning from your back to your side while in a flat bed without using bedrails?: A Little Help needed moving from lying on your back to sitting on the side of a flat bed without using bedrails?: A Little Help needed moving to and from a bed to a chair (including a wheelchair)?: A Little Help needed standing up from a chair using your arms (e.g., wheelchair or bedside chair)?: A Little Help needed to walk in hospital room?: A Lot Help needed climbing 3-5 steps with a railing? : A Lot 6 Click Score: 16    End of Session Equipment Utilized During Treatment: Gait belt;Oxygen Activity Tolerance: Patient limited by fatigue Patient left: in chair;with chair alarm set;with call bell/phone within reach Nurse Communication: Mobility status (small open wound RLE) PT Visit Diagnosis: Other abnormalities of gait and mobility (R26.89);Muscle weakness (generalized) (M62.81)    Time: 4132-4401 PT Time Calculation (min) (ACUTE ONLY): 24 min   Charges:   PT Evaluation $PT Eval Moderate Complexity: 1 Mod PT Treatments $Therapeutic Activity: 8-22 mins        Vickki Muff, PT, DPT   Acute Rehabilitation Department Pager #: [phone] -  [phone]  Ronnie Derby 11/29/2020, 11:31 AM

## 2020-11-29 NOTE — Progress Notes (Signed)
Heart Failure Nurse Navigator Progress Note  Attempted to complete interview for HV TOC readiness and [facility] needs.Pt speaking in broken sentences. Navigator stopped interview as pt quite winded on 4LPM via [place].   Granddaughter at bedside, states she lives with her and helps take care of her. Pt has hard time completing independent ADLs.   Of note: caretaker Scientist, product/process development) is pregnant which may complicate care after baby is delivered.   Will attempt to complete again tomorrow.   Ozella Rocks, MSN, RN Heart Failure Nurse Navigator [phone]

## 2020-11-29 NOTE — TOC Initial Note (Addendum)
Transition of Care [facility]) - Initial/Assessment Note    Patient Details  Name: Brittney [name] MRN: [id number] Date of Birth: 12-Jun-1945  Transition of [facility]) CM/SW Contact:    Elliot Cousin, RN Phone Number: [phone] 11/29/2020, 5:39 PM  Clinical Narrative:                 HF TOC CM spoke to pt and states he lives in home with granddtr and grandson. States she will need Rollator and 3n1 bedside commode for home. Offered Home Health and she is agreeable to agency that will accept referral. Will need [facility] RN and PT, aide orders with [facility]. Orders in for DME.  Expected Discharge Plan: Home w Home Health Services Barriers to Discharge: Continued Medical Work up   Patient Goals and CMS Choice Patient states their goals for this hospitalization and ongoing recovery are:: would like to go home after dc CMS Medicare.gov Compare Post Acute Care list provided to:: Patient Choice offered to / list presented to : Patient  Expected Discharge Plan and Services Expected Discharge Plan: Home w Home Health Services In-house Referral: Clinical Social Work Discharge Planning Services: CM Consult Post Acute Care Choice: Home Health Living arrangements for the past 2 months: Single Family Home  Prior Living Arrangements/Services Living arrangements for the past 2 months: Single Family Home Lives with:: Adult Children Patient language and need for interpreter reviewed:: Yes        Need for Family Participation in Patient Care: Yes (Comment) Care giver support system in place?: Yes (comment)   Criminal Activity/Legal Involvement Pertinent to Current Situation/Hospitalization: No - Comment as needed  Activities of Daily Living      Permission Sought/Granted Permission sought to share information with : Case Manager, Family Supports, PCP Permission granted to share information with : Yes, Verbal Permission Granted  Share Information with NAME: Feven Alderfer  Permission granted to  share info w AGENCY: Home Health, DME  Permission granted to share info w Relationship: granddaughter  Permission granted to share info w Contact Information: [phone]  Emotional Assessment   Attitude/Demeanor/Rapport: Gracious Affect (typically observed): Accepting Orientation: : Oriented to Self, Oriented to Place, Oriented to  Time, Oriented to Situation   Psych Involvement: No (comment)  Admission diagnosis:  ST elevation myocardial infarction (STEMI), unspecified artery (HCC) [I21.3] NSTEMI (non-ST elevated myocardial infarction) [facility]) [I21.4] Patient Active Problem List   Diagnosis Date Noted   NSTEMI (non-ST elevated myocardial infarction) (HCC) 11/27/2020   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) 10/11/2018   Carpal tunnel syndrome 10/11/2018   Cobalamin deficiency 10/11/2018   Leukocytosis 10/11/2018   Current use of insulin (HCC) 10/11/2018   Memory loss 10/11/2018   Aortic atherosclerosis (HCC) 09/03/2018   Diabetic ulcer of toe of left foot associated with type 2 diabetes mellitus, with fat layer exposed (HCC) 08/26/2018   Bilateral tinnitus 01/03/2016   Carotid artery occlusion without infarction 01/03/2016   Diabetic neuropathy (HCC) 01/03/2016   Diabetic retinopathy (HCC) 01/03/2016   Essential hypertension 01/03/2016   History of MI (myocardial infarction) 01/03/2016   Bruit of right carotid artery 10/27/2015   Solitary pulmonary nodule 10/27/2015   Hyperlipidemia, unspecified 01/11/2015   Allergic reaction 02/27/2013   PCP:  Andreas Blower., MD Pharmacy:   [facility] [phone] - [place], [place] - [address] [place] [address] Phone: [phone] Fax: [phone]  [facility]  [address] [place] [address] Phone: [phone] Fax: [phone]     Social Determinants of Health (SDOH) Interventions    Readmission Risk Interventions No  flowsheet data found.

## 2020-11-29 NOTE — Plan of Care (Signed)

## 2020-11-29 NOTE — Progress Notes (Signed)
She did not tolerate multi drug therapy/challenge this morning: Low-dose metoprolol succinate 12.5 mg, isosorbide mononitrate 15 mg, IV Lasix 80 mg, and spironolactone 12.5 mg. Drop blood pressure due to 75/40 mmHg, required 250 cc of IV saline as a bolus.  Blood pressures have recovered back to around the mean arterial pressure of 60 mmHg. Plan: Continue attempts at diuresis; will get the advanced heart failure team involved; goal now is to diurese without high blood pressure drop.  Breathing is somewhat better but still dyspneic at rest with waves of dyspnea possibly ischemically mediated. Not a good revascularization candidate. Possibly could do high risk Cfx/OM but heavily calcified and ends in a bifurcation. LAD also has significant diffuse disease. Probably not a good CABG candidate unless HF is treated better.

## 2020-11-30 ENCOUNTER — Inpatient Hospital Stay: Payer: Self-pay

## 2020-11-30 DIAGNOSIS — E119 Type 2 diabetes mellitus without complications: Secondary | ICD-10-CM | POA: Diagnosis not present

## 2020-11-30 DIAGNOSIS — I5021 Acute systolic (congestive) heart failure: Secondary | ICD-10-CM | POA: Diagnosis not present

## 2020-11-30 DIAGNOSIS — I214 Non-ST elevation (NSTEMI) myocardial infarction: Secondary | ICD-10-CM | POA: Diagnosis not present

## 2020-11-30 DIAGNOSIS — D649 Anemia, unspecified: Secondary | ICD-10-CM | POA: Diagnosis not present

## 2020-11-30 LAB — CBC
HCT: 24.4 % — ABNORMAL LOW (ref 36.0–46.0)
Hemoglobin: 7.8 g/dL — ABNORMAL LOW (ref 12.0–15.0)
MCH: 29.1 pg (ref 26.0–34.0)
MCHC: 32 g/dL (ref 30.0–36.0)
MCV: 91 fL (ref 80.0–100.0)
Platelets: 186 10*3/uL (ref 150–400)
RBC: 2.68 MIL/uL — ABNORMAL LOW (ref 3.87–5.11)
RDW: 15.4 % (ref 11.5–15.5)
WBC: 8.5 10*3/uL (ref 4.0–10.5)
nRBC: 0 % (ref 0.0–0.2)

## 2020-11-30 LAB — BASIC METABOLIC PANEL
Anion gap: 8 (ref 5–15)
BUN: 20 mg/dL (ref 8–23)
CO2: 26 mmol/L (ref 22–32)
Calcium: 8.2 mg/dL — ABNORMAL LOW (ref 8.9–10.3)
Chloride: 97 mmol/L — ABNORMAL LOW (ref 98–111)
Creatinine, Ser: 1.28 mg/dL — ABNORMAL HIGH (ref 0.44–1.00)
GFR, Estimated: 44 mL/min — ABNORMAL LOW (ref >60)
Glucose, Bld: 148 mg/dL — ABNORMAL HIGH (ref 70–99)
Potassium: 4.2 mmol/L (ref 3.5–5.1)
Sodium: 131 mmol/L — ABNORMAL LOW (ref 135–145)

## 2020-11-30 LAB — GLUCOSE, CAPILLARY
Glucose-Capillary: 114 mg/dL — ABNORMAL HIGH (ref 70–99)
Glucose-Capillary: 125 mg/dL — ABNORMAL HIGH (ref 70–99)
Glucose-Capillary: 189 mg/dL — ABNORMAL HIGH (ref 70–99)
Glucose-Capillary: 238 mg/dL — ABNORMAL HIGH (ref 70–99)
Glucose-Capillary: 282 mg/dL — ABNORMAL HIGH (ref 70–99)

## 2020-11-30 LAB — COOXEMETRY PANEL
Carboxyhemoglobin: 1.4 % (ref 0.5–1.5)
Carboxyhemoglobin: 1.6 % — ABNORMAL HIGH (ref 0.5–1.5)
Methemoglobin: 0.9 % (ref 0.0–1.5)
Methemoglobin: 1 % (ref 0.0–1.5)
O2 Saturation: 69.4 %
O2 Saturation: 91.3 %
Total hemoglobin: 8.5 g/dL — ABNORMAL LOW (ref 12.0–16.0)
Total hemoglobin: 8.9 g/dL — ABNORMAL LOW (ref 12.0–16.0)

## 2020-11-30 LAB — MAGNESIUM: Magnesium: 2.3 mg/dL (ref 1.7–2.4)

## 2020-11-30 MED ORDER — PREDNISONE 20 MG PO TABS
40.0000 mg | ORAL_TABLET | Freq: Every day | ORAL | Status: AC
  Administered 2020-11-30 – 2020-12-02 (×3): 40 mg via ORAL
  Filled 2020-11-30 (×3): qty 2

## 2020-11-30 MED ORDER — FUROSEMIDE 10 MG/ML IJ SOLN
80.0000 mg | Freq: Two times a day (BID) | INTRAMUSCULAR | Status: DC
  Administered 2020-11-30 – 2020-12-04 (×9): 80 mg via INTRAVENOUS
  Filled 2020-11-30 (×9): qty 8

## 2020-11-30 MED ORDER — SODIUM CHLORIDE 0.9% FLUSH: 10.0000 mL | INTRAVENOUS | Status: DC | PRN

## 2020-11-30 MED ORDER — SODIUM CHLORIDE 0.9% FLUSH
10.0000 mL | Freq: Two times a day (BID) | INTRAVENOUS | Status: DC
  Administered 2020-11-30 – 2020-12-06 (×12): 10 mL
  Administered 2020-12-06: 20 mL
  Administered 2020-12-07 – 2020-12-09 (×4): 10 mL

## 2020-11-30 NOTE — Progress Notes (Addendum)
Advanced Heart Failure Rounding Note  PCP-Cardiologist: None   Subjective:    Difficulty with attempting to place PICC line last night. Procedure aborted d/t trouble retracting guidewire.  Feels okay. Still has intermittent dyspnea.   Scr stable at 1.28.   No weights X 2 days.  BP improved  Hgb 9.6  > 8.4 > 7.8. Now off heparin.   Uric acid 9.3     Objective:   Weight Range: 103 kg Body mass index is 35.56 kg/m.   Vital Signs:   Temp:  [98.1 F (36.7 C)-98.7 F (37.1 C)] 98.7 F (37.1 C) (11/01 0640) Pulse Rate:  [74-92] 84 (11/01 0500) Resp:  [10-37] 14 (11/01 0500) BP: (70-115)/(43-75) 107/51 (11/01 0500) SpO2:  [95 %-100 %] 100 % (11/01 0500)    Weight change: Filed Weights   11/27/20 1838 11/28/20 0630  Weight: 99.8 kg 103 kg    Intake/Output:   Intake/Output Summary (Last 24 hours) at 11/30/2020 0759 Last data filed at 11/30/2020 0000 Gross per 24 hour  Intake 1095.01 ml  Output 1250 ml  Net -154.99 ml      Physical Exam    General:  Fatigued appearing elderly female. Comfortable on 3L 02 [place] HEENT: Normal Neck: Supple. JVP elevated . Carotids 2+ bilat; no bruits. No lymphadenopathy or thyromegaly appreciated. Cor: PMI nondisplaced. Regular rate & rhythm. No rubs, gallops or murmurs. Lungs: Diminished Abdomen: Soft, nontender, nondistended. No hepatosplenomegaly.  Extremities: No cyanosis, clubbing, rash, 2+ LE edema Neuro: Alert & orientedx3, cranial nerves grossly intact. moves all 4 extremities w/o difficulty. Affect pleasant   Telemetry   NSR 80s (personally reviewed)  Labs    CBC Recent Labs    11/27/20 1859 11/27/20 2101 11/29/20 0058 11/30/20 0037  WBC 7.1   < > 9.5 8.5  NEUTROABS 4.2  --   --   --   HGB 9.6*   < > 8.4* 7.8*  HCT 30.2*   < > 26.4* 24.4*  MCV 91.0   < > 90.7 91.0  PLT 238   < > 203 186   < > = values in this interval not displayed.   Basic Metabolic Panel Recent Labs    11/29/20 1404  11/30/20 0037  NA 136 131*  K 4.2 4.2  CL 100 97*  CO2 28 26  GLUCOSE 216* 148*  BUN 20 20  CREATININE 1.36* 1.28*  CALCIUM 8.6* 8.2*  MG 2.4 2.3   Liver Function Tests Recent Labs    11/27/20 1859  AST 23  ALT 21  ALKPHOS 44  BILITOT 0.4  PROT 7.9  ALBUMIN 3.5   No results for input(s): LIPASE, AMYLASE in the last 72 hours. Cardiac Enzymes No results for input(s): CKTOTAL, CKMB, CKMBINDEX, TROPONINI in the last 72 hours.  BNP: BNP (last 3 results) No results for input(s): BNP in the last 8760 hours.  ProBNP (last 3 results) No results for input(s): PROBNP in the last 8760 hours.   D-Dimer No results for input(s): DDIMER in the last 72 hours. Hemoglobin A1C Recent Labs    11/27/20 1858  HGBA1C 7.3*   Fasting Lipid Panel Recent Labs    11/27/20 1859 11/29/20 0058  CHOL 155  --   HDL 73  --   LDLCALC NOT CALCULATED  --   TRIG 94  --   CHOLHDL 2.1  --   LDLDIRECT  --  60.7   Thyroid Function Tests No results for input(s): TSH, T4TOTAL, T3FREE, THYROIDAB in the  last 72 hours.  Invalid input(s): FREET3  Other results:   Imaging    [place] EKG SITE RITE  Result Date: 11/29/2020 If Site Rite image not attached, placement could not be confirmed due to current cardiac rhythm.    Medications:     Scheduled Medications:  aspirin  81 mg Oral Daily   atorvastatin  80 mg Oral Daily   Chlorhexidine Gluconate Cloth  6 each Topical Daily   clopidogrel  75 mg Oral Daily   enoxaparin (LOVENOX) injection  60 mg Subcutaneous Q24H   insulin aspart  0-15 Units Subcutaneous TID WC   insulin aspart  0-5 Units Subcutaneous QHS   mouth rinse  15 mL Mouth Rinse BID   midodrine  5 mg Oral TID WC   mupirocin ointment  1 application Nasal BID   sodium chloride flush  3 mL Intravenous Q12H    Infusions:  sodium chloride 20 mL/hr at 11/29/20 1500   sodium chloride      PRN Medications: sodium chloride, acetaminophen, ondansetron (ZOFRAN) IV, sodium chloride  flush    Patient Profile   75 y.o. female with history of CAD s/p prior MI in 2004 (details not known), PAD, DM, HTN, HLD. Now admitted with NSTEMI c/b acute systolic HF.  Assessment/Plan   Acute systolic HF: -2/2 NSTEMI -LVEDP 26 mmHg at time of LHC -Echo LVEF 35-40%, RV okay -Awaiting PICC line placement, aborted last night d/t difficulty retracting guidewire. Will need to monitor CVP and Co-ox -Appears volume up. Will give furosemide 80 mg IV BID -Developed severe hypotension 10/31 after medications. Given 250 cc bolus NS.  -Continue midodrine 5 mg TID.  -Ted hose -Continue to hold all HF medications for now.    2. NSTEMI: -Known history of CAD with prior MI in 2004 in [place] (details not known) -Severe diffuse 3 V CAD on LHC this admit.  -Dr. Gala Romney reviewed cath films with Dr. Lynnette Caffey. PCI of ostial Lcx would be very high-risk with potential plaque shift into LM and LAD. Functional status significantly limited at baseline. Medical therapy seems to be best option at this time.  -HS troponin up to 1,249 -ASA + plavix -Heparin stopped d/t hemoglobin 8.4 -Continue Atorvastatin 80. LDL 60   3. Type II DM: -A1c 7.3 -Farxiga -SSI   4. PAD: -Hx Viabahn covered stent left SFA, stent left TP trunk, PTA left posterior tibial artery 09/20 d/t CLI - ASA + statin   5. Anemia: -Hgb 9.6 > 8.4 > 7.8, Hgb baseline has been in 10s on chart review. Transfuse for Hgb < 7 -Iron stores -Continue to monitor -No obvious source of bleeding  6. Possible gout -Uric acid 9.3 -Right ankle pain - Will give 40 mg prednisone X 3 days   Length of Stay: 3  FINCH, LINDSAY N, PA-C  11/30/2020, 7:59 AM  Advanced Heart Failure Team Pager [phone] (M-F; 7a - 5p)  Please contact [facility] for night-coverage after hours (5p -7a ) and weekends on [email or web address]  Patient seen and examined with the above-signed Advanced Practice Provider and/or Housestaff. I personally reviewed laboratory  data, imaging studies and relevant notes. I independently examined the patient and formulated the important aspects of the plan. I have edited the note to reflect any of my changes or salient points. I have personally discussed the plan with the patient and/or family.  Unable to get PICC placed last night. BP improved with midodrine.   Says SOB "comes and goes"  Denies CP, orthopnea or  PND  General: Elderly woman sitting up in bed. Fatigued. . No resp difficulty HEENT: normal Neck: supple.  JVP hard to see Carotids 2+ bilat; no bruits. No lymphadenopathy or thryomegaly appreciated. Cor: PMI nondisplaced. Regular rate & rhythm. No rubs, gallops or murmurs. Lungs: clear Abdomen: obese soft, nontender, nondistended. No hepatosplenomegaly. No bruits or masses. Good bowel sounds. Extremities: no cyanosis, clubbing, rash, 2+ edema Neuro: alert & orientedx3, cranial nerves grossly intact. moves all 4 extremities w/o difficulty. Affect pleasant  BP improved. She appears volume overloaded. Will continue midodrine for BP support and try to push diuresis today. Place TED hose. Retry PICC to follow CVP and co-ox. Manage CAD medically. Likely can go to tele later today.   Glori Bickers, MD  8:46 AM

## 2020-11-30 NOTE — Plan of Care (Signed)
  Problem: Education: Goal: Knowledge of General Education information will improve Description: Including pain rating scale, medication(s)/side effects and non-pharmacologic comfort measures Outcome: Progressing   Problem: Health Behavior/Discharge Planning: Goal: Ability to manage health-related needs will improve Outcome: Progressing   Problem: Clinical Measurements: Goal: Ability to maintain clinical measurements within normal limits will improve Outcome: Progressing Goal: Will remain free from infection Outcome: Progressing Goal: Diagnostic test results will improve Outcome: Progressing Goal: Respiratory complications will improve Outcome: Progressing Goal: Cardiovascular complication will be avoided Outcome: Progressing   Problem: Nutrition: Goal: Adequate nutrition will be maintained Outcome: Progressing   Problem: Coping: Goal: Level of anxiety will decrease Outcome: Progressing   Problem: Elimination: Goal: Will not experience complications related to bowel motility Outcome: Progressing Goal: Will not experience complications related to urinary retention Outcome: Progressing   Problem: Safety: Goal: Ability to remain free from injury will improve Outcome: Progressing   Problem: Pain Managment: Goal: General experience of comfort will improve Outcome: Progressing   

## 2020-11-30 NOTE — Progress Notes (Signed)
PICC placement was ordered to be placed by PICC team 11/29/20.  During procedure primary RN was told that there was an issue with retracting the guidewire and that MD intervention was necessary.  Primary RN paged Dr. Launa Grill and he came bedside to assess situation.  After warm compresses and peppermint oil, the guidewire successfully retracted.  Dr. Launa Grill said to hold off on PICC placement for tonight related to significant vasoconstriction and to wait until morning when there are more resources available.

## 2020-11-30 NOTE — Progress Notes (Signed)
Peripherally Inserted Central Catheter Placement  The IV Nurse has discussed with the patient and/or persons authorized to consent for the patient, the purpose of this procedure and the potential benefits and risks involved with this procedure.  The benefits include less needle sticks, lab draws from the catheter, and the patient may be discharged home with the catheter. Risks include, but not limited to, infection, bleeding, blood clot (thrombus formation), and puncture of an artery; nerve damage and irregular heartbeat and possibility to perform a PICC exchange if needed/ordered by physician.  Alternatives to this procedure were also discussed.  Bard Power PICC patient education guide, fact sheet on infection prevention and patient information card has been provided to patient /or left at bedside.    PICC Placement Documentation  PICC Double Lumen 11/30/20 PICC Left Basilic 48 cm 0 cm (Active)  Indication for Insertion or Continuance of Line Vasoactive infusions 11/30/20 1040  Exposed Catheter (cm) 0 cm 11/30/20 1040  Site Assessment Clean;Dry 11/30/20 1040  Lumen #1 Status Flushed;Saline locked;Blood return noted 11/30/20 1040  Lumen #2 Status Flushed;Saline locked 11/30/20 1040  Dressing Type Transparent;Securing device 11/30/20 1040  Dressing Status Clean;Dry;Intact 11/30/20 1040  Antimicrobial disc in place? Yes 11/30/20 1040  Safety Lock Not Applicable 11/30/20 1040  Dressing Intervention Other (Comment);New dressing 11/30/20 1040  Dressing Change Due 12/07/20 11/30/20 1040       Annett Fabian 11/30/2020, 10:42 AM

## 2020-11-30 NOTE — Progress Notes (Signed)
Heart Failure Navigator Progress Note  Assessed for Heart & Vascular TOC clinic readiness.  Patient does not meet criteria due to AHF rounding team consulted this admission.   Navigator available for reassessment of patient.   Lamaya Hyneman, MSN, RN Heart Failure Nurse Navigator [phone]   

## 2020-11-30 NOTE — Progress Notes (Signed)
Physical Therapy Treatment Patient Details Name: Brittney [name] MRN: [id number] DOB: 12-30-1945 Today's Date: 11/30/2020   History of Present Illness The pt is a 74 yo female presenting 10/29 with worsening SOB and chest tightness, pt found to have multi-vessel CAD with NSTEMI. Not a surgical candidate at this time, plan for medical management. PMH includes: CAD, DM II, DTV, HLD, HTN, and CVA.    PT Comments    Pt making steady progress with mobility. Limited primarily by dyspnea and fatigue. Expect will continue to progress toward home.    Recommendations for follow up therapy are one component of a multi-disciplinary discharge planning process, led by the attending physician.  Recommendations may be updated based on patient status, additional functional criteria and insurance authorization.  Follow Up Recommendations  Home health PT     Assistance Recommended at Discharge Intermittent Supervision/Assistance  Equipment Recommendations  Rollator (4 wheels)    Recommendations for Other Services       Precautions / Restrictions Precautions Precautions: Fall     Mobility  Bed Mobility Overal bed mobility: Needs Assistance Bed Mobility: Sit to Supine       Sit to supine: Supervision   General bed mobility comments: supervision for lines/tubes    Transfers Overall transfer level: Needs assistance Equipment used: Rolling walker (2 wheels) Transfers: Sit to/from Stand Sit to Stand: Min assist           General transfer comment: Assist to bring hips up and for balance    Ambulation/Gait Ambulation/Gait assistance: Min guard Gait Distance (Feet): 35 Feet Assistive device: Rolling walker (2 wheels) Gait Pattern/deviations: Step-to pattern;Decreased stride length;Shuffle;Decreased step length - left;Decreased step length - right Gait velocity: decreased Gait velocity interpretation: <1.31 ft/sec, indicative of household ambulator General Gait Details: Assist for  Media planner    Modified Rankin (Stroke Patients Only)       Balance Overall balance assessment: Needs assistance Sitting-balance support: No upper extremity supported;Feet supported Sitting balance-Leahy Scale: Good     Standing balance support: No upper extremity supported Standing balance-Leahy Scale: Fair                              Cognition Arousal/Alertness: Awake/alert Behavior During Therapy: WFL for tasks assessed/performed Overall Cognitive Status: Within Functional Limits for tasks assessed                                          Exercises      General Comments General comments (skin integrity, edema, etc.): Pt on 4L of O2 with SpO2 >94%. Dyspnea 3/4 with activity.      Pertinent Vitals/Pain Pain Assessment: No/denies pain    Home Living                          Prior Function            PT Goals (current goals can now be found in the care plan section) Progress towards PT goals: Progressing toward goals    Frequency    Min 3X/week      PT Plan Current plan remains appropriate    Co-evaluation              AM-PAC PT "  6 Clicks" Mobility   Outcome Measure  Help needed turning from your back to your side while in a flat bed without using bedrails?: A Little Help needed moving from lying on your back to sitting on the side of a flat bed without using bedrails?: A Little Help needed moving to and from a bed to a chair (including a wheelchair)?: A Little Help needed standing up from a chair using your arms (e.g., wheelchair or bedside chair)?: A Little Help needed to walk in hospital room?: A Little Help needed climbing 3-5 steps with a railing? : A Lot 6 Click Score: 17    End of Session Equipment Utilized During Treatment: Gait belt;Oxygen Activity Tolerance: Patient limited by fatigue Patient left: in bed;with call bell/phone within  reach;with nursing/sitter in room Nurse Communication: Mobility status PT Visit Diagnosis: Other abnormalities of gait and mobility (R26.89);Muscle weakness (generalized) (M62.81)     Time: 4970-2637 PT Time Calculation (min) (ACUTE ONLY): 14 min  Charges:  $Gait Training: 8-22 mins                     [facility] PT Acute Rehabilitation Services Pager [phone] Office [phone]    Angelina Ok [facility] 11/30/2020, 3:10 PM

## 2020-11-30 NOTE — Progress Notes (Signed)
PT Cancellation Note  Patient Details Name: Brittney [name] MRN: [id number] DOB: 1945/10/13   Cancelled Treatment:    Reason Eval/Treat Not Completed: Patient at procedure or test/unavailable. Pt getting PICC. Will continue attempts.   Angelina Ok [facility] 11/30/2020, 10:42 AM Skip Mayer PT Acute Rehabilitation Services Pager [phone] Office [phone]

## 2020-11-30 NOTE — Progress Notes (Signed)
Report called to nurse on 2C, pt and family at bedside aware of the transfer to new room. No concerns noted.

## 2020-11-30 NOTE — Progress Notes (Signed)
Spoke to the patient and Dr. Gala Romney.  Will follow and help as/if needed.

## 2020-11-30 NOTE — Progress Notes (Signed)
PICC placed this am. Co-ox 69%. CVP 9-10. Diuresing.   Stable for transfer to stepdown unit. Orders placed.

## 2020-12-01 DIAGNOSIS — I5021 Acute systolic (congestive) heart failure: Secondary | ICD-10-CM | POA: Diagnosis not present

## 2020-12-01 DIAGNOSIS — I214 Non-ST elevation (NSTEMI) myocardial infarction: Secondary | ICD-10-CM | POA: Diagnosis not present

## 2020-12-01 LAB — CBC
HCT: 26.1 % — ABNORMAL LOW (ref 36.0–46.0)
Hemoglobin: 8.4 g/dL — ABNORMAL LOW (ref 12.0–15.0)
MCH: 28.8 pg (ref 26.0–34.0)
MCHC: 32.2 g/dL (ref 30.0–36.0)
MCV: 89.4 fL (ref 80.0–100.0)
Platelets: 184 10*3/uL (ref 150–400)
RBC: 2.92 MIL/uL — ABNORMAL LOW (ref 3.87–5.11)
RDW: 15 % (ref 11.5–15.5)
WBC: 10.2 10*3/uL (ref 4.0–10.5)
nRBC: 0 % (ref 0.0–0.2)

## 2020-12-01 LAB — BASIC METABOLIC PANEL
Anion gap: 9 (ref 5–15)
BUN: 23 mg/dL (ref 8–23)
CO2: 28 mmol/L (ref 22–32)
Calcium: 8.5 mg/dL — ABNORMAL LOW (ref 8.9–10.3)
Chloride: 97 mmol/L — ABNORMAL LOW (ref 98–111)
Creatinine, Ser: 1.24 mg/dL — ABNORMAL HIGH (ref 0.44–1.00)
GFR, Estimated: 45 mL/min — ABNORMAL LOW (ref >60)
Glucose, Bld: 170 mg/dL — ABNORMAL HIGH (ref 70–99)
Potassium: 3.7 mmol/L (ref 3.5–5.1)
Sodium: 134 mmol/L — ABNORMAL LOW (ref 135–145)

## 2020-12-01 LAB — COOXEMETRY PANEL
Carboxyhemoglobin: 1.4 % (ref 0.5–1.5)
Methemoglobin: 1 % (ref 0.0–1.5)
O2 Saturation: 64 %
Total hemoglobin: 9.3 g/dL — ABNORMAL LOW (ref 12.0–16.0)

## 2020-12-01 LAB — GLUCOSE, CAPILLARY
Glucose-Capillary: 153 mg/dL — ABNORMAL HIGH (ref 70–99)
Glucose-Capillary: 193 mg/dL — ABNORMAL HIGH (ref 70–99)
Glucose-Capillary: 257 mg/dL — ABNORMAL HIGH (ref 70–99)
Glucose-Capillary: 258 mg/dL — ABNORMAL HIGH (ref 70–99)

## 2020-12-01 LAB — MAGNESIUM: Magnesium: 2.3 mg/dL (ref 1.7–2.4)

## 2020-12-01 MED ORDER — POTASSIUM CHLORIDE CRYS ER 20 MEQ PO TBCR
40.0000 meq | EXTENDED_RELEASE_TABLET | Freq: Two times a day (BID) | ORAL | Status: AC
  Administered 2020-12-01 (×2): 40 meq via ORAL
  Filled 2020-12-01 (×2): qty 2

## 2020-12-01 MED ORDER — METOLAZONE 2.5 MG PO TABS
2.5000 mg | ORAL_TABLET | Freq: Once | ORAL | Status: AC
  Administered 2020-12-01: 2.5 mg via ORAL
  Filled 2020-12-01: qty 1

## 2020-12-01 MED ORDER — SODIUM CHLORIDE 0.9 % IV SOLN: 250.0000 mL | INTRAVENOUS | Status: DC | PRN

## 2020-12-01 MED ORDER — SODIUM CHLORIDE 0.9% FLUSH: 3.0000 mL | INTRAVENOUS | Status: DC | PRN

## 2020-12-01 MED ORDER — SODIUM CHLORIDE 0.9 % IV SOLN: INTRAVENOUS | Status: DC

## 2020-12-01 MED ORDER — SODIUM CHLORIDE 0.9% FLUSH: 3.0000 mL | Freq: Two times a day (BID) | INTRAVENOUS | Status: DC

## 2020-12-01 NOTE — H&P (View-Only) (Signed)
  Advanced Heart Failure Rounding Note  PCP-Cardiologist: None   Subjective:    More dyspneic today. Very fatigued.   Co-ox 64%.   CVP 10-11  Scr stable at 1.24.   Is/Os not accurate. Several unmeasured voids. ? Weights.  Hgb 9.6  > 8.4 > 7.8 > 8.4. Now off heparin.     Objective:   Weight Range: 96 kg Body mass index is 33.15 kg/m.   Vital Signs:   Temp:  [98 F (36.7 C)-98.8 F (37.1 C)] 98.5 F (36.9 C) (11/02 0957) Pulse Rate:  [77-95] 95 (11/02 0957) Resp:  [19-29] 29 (11/02 0957) BP: (105-133)/(49-88) 126/67 (11/02 0957) SpO2:  [96 %-99 %] 97 % (11/02 0745) Weight:  [96 kg] 96 kg (11/02 0500)    Weight change: Filed Weights   11/27/20 1838 11/28/20 0630 12/01/20 0500  Weight: 99.8 kg 103 kg 96 kg    Intake/Output:   Intake/Output Summary (Last 24 hours) at 12/01/2020 1052 Last data filed at 12/01/2020 0900 Gross per 24 hour  Intake 720 ml  Output 1450 ml  Net -730 ml      Physical Exam  CVP 10-11 General:  Fatigued appearing elderly female. HEENT: normal Neck: supple. JVP 10-12. Carotids 2+ bilat; no bruits. No lymphadenopathy or thryomegaly appreciated. Cor: PMI nondisplaced. Regular rate & rhythm. No rubs, gallops or murmurs. Lungs: clear Abdomen: soft, nontender, nondistended. No hepatosplenomegaly. No bruits or masses. Good bowel sounds. Extremities: no cyanosis, clubbing, rash, 1+ edema, TED hose on, LUE PICC Neuro: alert & orientedx3, cranial nerves grossly intact. moves all 4 extremities w/o difficulty. Affect pleasant    Telemetry   NSR 80s-90s (personally reviewed)  Labs    CBC Recent Labs    11/30/20 0037 12/01/20 0430  WBC 8.5 10.2  HGB 7.8* 8.4*  HCT 24.4* 26.1*  MCV 91.0 89.4  PLT 186 184   Basic Metabolic Panel Recent Labs    11/30/20 0037 12/01/20 0430  NA 131* 134*  K 4.2 3.7  CL 97* 97*  CO2 26 28  GLUCOSE 148* 170*  BUN 20 23  CREATININE 1.28* 1.24*  CALCIUM 8.2* 8.5*  MG 2.3 2.3   Liver  Function Tests No results for input(s): AST, ALT, ALKPHOS, BILITOT, PROT, ALBUMIN in the last 72 hours.  No results for input(s): LIPASE, AMYLASE in the last 72 hours. Cardiac Enzymes No results for input(s): CKTOTAL, CKMB, CKMBINDEX, TROPONINI in the last 72 hours.  BNP: BNP (last 3 results) No results for input(s): BNP in the last 8760 hours.  ProBNP (last 3 results) No results for input(s): PROBNP in the last 8760 hours.   D-Dimer No results for input(s): DDIMER in the last 72 hours. Hemoglobin A1C No results for input(s): HGBA1C in the last 72 hours.  Fasting Lipid Panel Recent Labs    11/29/20 0058  LDLDIRECT 60.7   Thyroid Function Tests No results for input(s): TSH, T4TOTAL, T3FREE, THYROIDAB in the last 72 hours.  Invalid input(s): FREET3  Other results:   Imaging    No results found.   Medications:     Scheduled Medications:  aspirin  81 mg Oral Daily   atorvastatin  80 mg Oral Daily   Chlorhexidine Gluconate Cloth  6 each Topical Daily   clopidogrel  75 mg Oral Daily   furosemide  80 mg Intravenous BID   insulin aspart  0-15 Units Subcutaneous TID WC   insulin aspart  0-5 Units Subcutaneous QHS   mouth rinse  15 mL Mouth   Rinse BID   metolazone  2.5 mg Oral Once   midodrine  5 mg Oral TID WC   mupirocin ointment  1 application Nasal BID   potassium chloride  40 mEq Oral BID   predniSONE  40 mg Oral Q breakfast   sodium chloride flush  10-40 mL Intracatheter Q12H   sodium chloride flush  3 mL Intravenous Q12H    Infusions:  sodium chloride      PRN Medications: sodium chloride, acetaminophen, ondansetron (ZOFRAN) IV, sodium chloride flush, sodium chloride flush    Patient Profile   75 y.o. female with history of CAD s/p prior MI in 2004 (details not known), PAD, DM, HTN, HLD. Now admitted with NSTEMI c/b acute systolic HF.  Assessment/Plan   Acute systolic HF: -2/2 NSTEMI -LVEDP 26 mmHg at time of LHC -Echo LVEF 35-40%, RV  okay -Co-ox 64%, not on inotrope -CVP 10-11 -On IV lasix 80 mg BID. Appears volume up and more dyspneic. Will give 2.5 mg metolazone once. Scr stable 1.24. K 3.7 > replace. Mag okay. -Plan for RHC tomorrow to better assess CO and filling pressures -Developed severe hypotension 10/31 after medications. Given 250 cc bolus NS. All GDMT on hold. -Continue midodrine 5 mg TID.  -Ted hose   2. NSTEMI: -Known history of CAD with prior MI in 2004 in [place] (details not known) -Severe diffuse 3 V CAD on LHC this admit.  -Dr. Haroldine Laws reviewed cath films with Dr. Ali Lowe. PCI of ostial Lcx would be very high-risk with potential plaque shift into LM and LAD. Functional status significantly limited at baseline. Medical therapy seems to be best option at this time.  -HS troponin up to 1,249 -ASA + plavix -Heparin stopped d/t hemoglobin 8.4. hgb  -Continue Atorvastatin 80. LDL 60   3. Type II DM: -A1c 7.3 -Consider Farxiga prior to D/C -SSI   4. PAD: -Hx Viabahn covered stent left SFA, stent left TP trunk, PTA left posterior tibial artery 09/20 d/t CLI - ASA + statin   5. Anemia: -Hgb 9.6 > 8.4 > 7.8 > 8.4, Hgb baseline has been in 10s on chart review. Transfuse for Hgb < 7 -Iron stores okay -Continue to monitor -No obvious source of bleeding  6. Possible gout -Uric acid 9.3 -Right ankle pain, Treating with prednisone burst X 3 days  GOC: Remains tenuous. Concerned about her severe weakness and frailty. Dr. Haroldine Laws had discussion with her granddaughter who was at bedside and daughter, Aniceto Boss, on the phone. Will consult palliative care to assist with goals of care discussion. If not progressing favorably over the next few days, may be headed towards more palliative approach   Disposition: HH/PT recommended at discharge CR consult   Length of Stay: [place], [place], PA-C  12/01/2020, 10:52 AM  Advanced Heart Failure Team Pager [phone] (M-F; 7a - 5p)  Please contact [facility] for night-coverage after hours (5p -7a ) and weekends on [email or web address]  Patient seen and examined with the above-signed Advanced Practice Provider and/or Housestaff. I personally reviewed laboratory data, imaging studies and relevant notes. I independently examined the patient and formulated the important aspects of the plan. I have edited the note to reflect any of my changes or salient points. I have personally discussed the plan with the patient and/or family.  Sitting up in chair. Very weak. Denies CP. Says SOB comes and goes.   Co-ox 64% CVP 10-11.   General:  Weak appearing. No resp difficulty HEENT: normal Neck:  supple. JVP 10 Carotids 2+ bilat; no bruits. No lymphadenopathy or thryomegaly appreciated. Cor: PMI nondisplaced. Regular rate & rhythm. No rubs, gallops or murmurs. Lungs: clear Abdomen: obese soft, nontender, nondistended. No hepatosplenomegaly. No bruits or masses. Good bowel sounds. Extremities: no cyanosis, clubbing, rash, tr edema Neuro: alert & orientedx3, cranial nerves grossly intact. moves all 4 extremities w/o difficulty. Affect pleasant  I remain very concerned about her due to increasing weakness, fatigue and dyspnea. Will continue diuresis and plan RHC tomorrow. She is not candidate for PCI.   Discussed trajectory with daughter (by phone) and granddaughter (in person). We talked about possible SNF and also palliative care.   Her granddaughter has been her primary caretaker and says that prior to hospitalization was able to get around some and would like to see her get back to that.   Will consult Palliative Care to help with GOC.   Arvilla Meres, MD  4:37 PM

## 2020-12-01 NOTE — Care Management Important Message (Signed)
Important Message  Patient Details  Name: Brittney [name] MRN: [id number] Date of Birth: [birth year]   Medicare Important Message Given:  Yes     Dorena Bodo 12/01/2020, 2:22 PM

## 2020-12-01 NOTE — Progress Notes (Signed)
When meeting patient this am she appeared in no distress until she began talking, at which point it was noted her pausing slightly between each word due to shortness of breath.   Respiration are have jumped up to 28-35 this am. Overnight they ranged from 19-22 and looking back through the chart, this increase appears to be new. All other vitals remain stable. Oxygen saturation remains unchanged from last evening on 2 Liters of oxygen. CVP 15, last reading was 11.  Physician paged regarding above, and is at the bedside. Continue to monitor.

## 2020-12-01 NOTE — Progress Notes (Signed)
Patient's MEWs turned yellow at 0957. At beginning of the shift, patient's respiratory rate was noted to be elevated with respiratory distress noted only when patient would speak to Clinical research associate. Otherwise, appeared in no distress with other vitals within limits and per her trend. Page was sent to [place], [place], CHF team to notify of increase in RR. Vitals signs escalated to q2h per MEWs policy, and charge nurse notified.    12/01/20 0957  Assess: MEWS Score  Temp 98.5 F (36.9 C)  BP 126/67  Pulse Rate 95  ECG Heart Rate 97  Resp (!) 29  Level of Consciousness Alert  O2 Device Nasal Cannula  Patient Activity (if Appropriate) In bed  O2 Flow Rate (L/min) 2 L/min  Assess: MEWS Score  MEWS Temp 0  MEWS Systolic 0  MEWS Pulse 0  MEWS RR 2  MEWS LOC 0  MEWS Score 2  MEWS Score Color Yellow  Assess: if the MEWS score is Yellow or Red  Were vital signs taken at a resting state? Yes  Focused Assessment No change from prior assessment  Early Detection of Sepsis Score *See Row Information* Medium  MEWS guidelines implemented *See Row Information* Yes  Treat  MEWS Interventions Escalated (See documentation below)  Pain Scale 0-10  Pain Score 0  Take Vital Signs  Increase Vital Sign Frequency  Yellow: Q 2hr X 2 then Q 4hr X 2, if remains yellow, continue Q 4hrs  Notify: Charge Nurse/RN  Name of Charge Nurse/RN Notified kristi, RN  Date Charge Nurse/RN Notified 12/01/20  Time Charge Nurse/RN Notified 1112

## 2020-12-01 NOTE — Progress Notes (Signed)
Inpatient Diabetes Program Recommendations  AACE/ADA: New Consensus Statement on Inpatient Glycemic Control (2015)  Target Ranges:  Prepandial:   less than 140 mg/dL      Peak postprandial:   less than 180 mg/dL (1-2 hours)      Critically ill patients:  140 - 180 mg/dL   Lab Results  Component Value Date   GLUCAP 153 (H) 12/01/2020   HGBA1C 7.3 (H) 11/27/2020    Review of Glycemic Control Results for [name], Brittney [name] (MRN [id number]) as of 12/01/2020 08:08  Ref. Range 11/30/2020 06:39 11/30/2020 11:17 11/30/2020 16:50 11/30/2020 21:03 12/01/2020 06:15  Glucose-Capillary Latest Ref Range: 70 - 99 mg/dL 825 (H) 053 (H) 976 (H) 282 (H) 153 (H)   Diabetes history: DM 2 Outpatient Diabetes medications: Metformin 1000 mg bid, Actos 30 mg Daily Current orders for Inpatient glycemic control:  Novolog 0-15 units tid + hs  PO prednisone 40 mg Daily A1c 7.3% on 10/29  Inpatient Diabetes Program Recommendations:    Glucose trends increase after prednisone dose  -  Consider adding Novolog 2-3 units tid meal coverage if eating >50% of meals  Thanks,  Christena Deem RN, MSN, BC-ADM Inpatient Diabetes Coordinator Team Pager [phone] (8a-5p)

## 2020-12-01 NOTE — Plan of Care (Signed)
  Problem: Clinical Measurements: Goal: Ability to maintain clinical measurements within normal limits will improve Outcome: Progressing Goal: Will remain free from infection Outcome: Progressing Goal: Cardiovascular complication will be avoided Outcome: Progressing   Problem: Activity: Goal: Risk for activity intolerance will decrease Outcome: Progressing   Problem: Nutrition: Goal: Adequate nutrition will be maintained Outcome: Progressing   

## 2020-12-01 NOTE — Evaluation (Signed)
Occupational Therapy Evaluation Patient Details Name: Brittney [name] MRN: [id number] DOB: October 22, 1945 Today's Date: 12/01/2020   History of Present Illness The pt is a 75 yo female presenting 10/29 with worsening SOB and chest tightness, pt found to have multi-vessel CAD with NSTEMI. Not a surgical candidate at this time, plan for medical management. PMH includes: CAD, DM II, DTV, HLD, HTN, and CVA.   Clinical Impression   Brittney [name] was mod I PTA with intermittent assistance from grandchildren who live with her, pt states she required extensive breaks when walking from room to room at home. Pt lives in a 1 level home with 3 STE with her grandchildren who are able to assist 24/7 as needed. Upon evaluation pt is now limited greatly by activity tolerance and generalized weakness. Pt now requires up to mod A for ADLs, and close min guard with RW for ambulation. She only tolerated about 2 minutes of standing functional activity this session (LB wash after incontinent bladder), O2 sat dropping to 85% on 2L [place] and RR elevating; recovered quickly after sitting break. Pt will benefit from continued OT acutely. Recommend HHOT to progress rehab in the home setting.      Recommendations for follow up therapy are one component of a multi-disciplinary discharge planning process, led by the attending physician.  Recommendations may be updated based on patient status, additional functional criteria and insurance authorization.   Follow Up Recommendations  Home health OT    Assistance Recommended at Discharge Frequent or constant Supervision/Assistance  Functional Status Assessment  Patient has had a recent decline in their functional status and demonstrates the ability to make significant improvements in function in a reasonable and predictable amount of time.  Equipment Recommendations  Tub/shower bench    Recommendations for Other Services       Precautions / Restrictions Precautions Precautions:  Fall Precaution Comments: watch O2, RR Restrictions Weight Bearing Restrictions: No      Mobility Bed Mobility Overal bed mobility: Needs Assistance             General bed mobility comments: pt in chair    Transfers Overall transfer level: Needs assistance Equipment used: Rolling walker (2 wheels) Transfers: Sit to/from Stand Sit to Stand: Min assist           General transfer comment: min  A for verbal cues for safe hand placement wtih RW, incrsaed time and effort for task      Balance Overall balance assessment: Needs assistance Sitting-balance support: Feet supported Sitting balance-Leahy Scale: Good     Standing balance support: Single extremity supported;During functional activity Standing balance-Leahy Scale: Fair                             ADL either performed or assessed with clinical judgement   ADL Overall ADL's : Needs assistance/impaired Eating/Feeding: Independent;Sitting   Grooming: Set up;Sitting   Upper Body Bathing: Minimal assistance;Sitting   Lower Body Bathing: Moderate assistance;Sit to/from stand   Upper Body Dressing : Set up;Sitting   Lower Body Dressing: Moderate assistance;Sit to/from stand   Toilet Transfer: Min guard;Ambulation;Rolling walker (2 wheels) Toilet Transfer Details (indicate cue type and reason): short ambulation tolerance only Toileting- Clothing Manipulation and Hygiene: Supervision/safety;Sitting/lateral lean       Functional mobility during ADLs: Min guard;Rolling walker (2 wheels) General ADL Comments: pt limtied by generalized weakness, poor activity tolerance, fatigue. Pt unable to reach her feet at this time and needs assist  for lower body tasks     Vision Baseline Vision/History: 0 No visual deficits Vision Assessment?: No apparent visual deficits     Perception     Praxis      Pertinent Vitals/Pain Pain Assessment: No/denies pain     Hand Dominance Right   Extremity/Trunk  Assessment Upper Extremity Assessment Upper Extremity Assessment: Generalized weakness   Lower Extremity Assessment Lower Extremity Assessment: Defer to PT evaluation   Cervical / Trunk Assessment Cervical / Trunk Assessment: Kyphotic   Communication Communication Communication: No difficulties   Cognition Arousal/Alertness: Awake/alert Behavior During Therapy: WFL for tasks assessed/performed;Flat affect Overall Cognitive Status: Within Functional Limits for tasks assessed                                 General Comments: pt able to follow all cues, required gentle encouragement to participate. Slow and deliberate for all functional tasks     General Comments  2L [place] maintain throughout entire session, SpO2 dropped to 85% after standing for 2 minutes, recovered to 90% after sitting     Home Living Family/patient expects to be discharged to:: Private residence Living Arrangements: Other relatives (grandchildren) Available Help at Discharge: Family;Available 24 hours/day Type of Home: House Home Access: Stairs to enter [employer] of Steps: 3 Entrance Stairs-Rails: None Home Layout: One level     Bathroom Shower/Tub: Chief Strategy Officer: Standard     Home Equipment: Agricultural consultant (2 wheels);Cane - single point;Wheelchair - manual   Additional Comments: pt states the wc can fit through the doorways ar home, RW will not fit into bathroom      Prior Functioning/Environment Prior Level of Function : Needs assist       Physical Assist : Mobility (physical);ADLs (physical) Mobility (physical): Gait;Stairs ADLs (physical): IADLs Mobility Comments: pt reports until reccently she was independent, but using cane in the home and in community. in last few weeks has needed increased assist, rest breaks, and WC ADLs Comments: pt reports she can complete all ADLs with increased time, assist for IADLs        OT Problem List: Decreased  strength;Decreased activity tolerance;Impaired balance (sitting and/or standing);Cardiopulmonary status limiting activity;Pain      OT Treatment/Interventions: Therapeutic exercise;Self-care/ADL training;DME and/or AE instruction;Therapeutic activities;Patient/family education;Balance training    OT Goals(Current goals can be found in the care plan section) Acute Rehab OT Goals Patient Stated Goal: back to independence OT Goal Formulation: With patient Time For Goal Achievement: 12/15/20 Potential to Achieve Goals: Fair ADL Goals Pt Will Perform Lower Body Bathing: sit to/from stand;with set-up Pt Will Perform Lower Body Dressing: with set-up;sit to/from stand Pt Will Transfer to Toilet: ambulating;bedside commode Pt Will Perform Tub/Shower Transfer: with supervision;tub bench;rolling walker;ambulating Additional ADL Goal #1: Pt will tolerate at least 5 minutes of OOB functional activity in preparation for incrased ability to complete ADLs  OT Frequency: Min 2X/week   Barriers to D/C: Inaccessible home environment  3 STE, no rail          AM-PAC OT "6 Clicks" Daily Activity     Outcome Measure Help from another person eating meals?: None Help from another person taking care of personal grooming?: A Little Help from another person toileting, which includes using toliet, bedpan, or urinal?: A Little Help from another person bathing (including washing, rinsing, drying)?: A Lot Help from another person to put on and taking off regular upper body clothing?:  A Little Help from another person to put on and taking off regular lower body clothing?: A Lot 6 Click Score: 17   End of Session Equipment Utilized During Treatment: Gait belt;Rolling walker (2 wheels);Oxygen Nurse Communication: Mobility status  Activity Tolerance: Patient limited by fatigue Patient left: in chair;with call bell/phone within reach;with family/visitor present  OT Visit Diagnosis: Other abnormalities of gait and  mobility (R26.89);Muscle weakness (generalized) (M62.81);Pain                Time: 1355-1413 OT Time Calculation (min): 18 min Charges:  OT General Charges $OT Visit: 1 Visit   Davan Hark A Rossetta Kama 12/01/2020, 2:39 PM

## 2020-12-01 NOTE — Plan of Care (Signed)
  Problem: Education: Goal: Knowledge of General Education information will improve Description: Including pain rating scale, medication(s)/side effects and non-pharmacologic comfort measures Outcome: Progressing   Problem: Health Behavior/Discharge Planning: Goal: Ability to manage health-related needs will improve Outcome: Progressing   Problem: Clinical Measurements: Goal: Ability to maintain clinical measurements within normal limits will improve Outcome: Progressing Goal: Diagnostic test results will improve Outcome: Progressing Goal: Respiratory complications will improve Outcome: Progressing   Problem: Activity: Goal: Risk for activity intolerance will decrease Outcome: Progressing   Problem: Nutrition: Goal: Adequate nutrition will be maintained Outcome: Progressing   Problem: Pain Managment: Goal: General experience of comfort will improve Outcome: Progressing   Problem: Skin Integrity: Goal: Risk for impaired skin integrity will decrease Outcome: Progressing

## 2020-12-01 NOTE — Progress Notes (Addendum)
Advanced Heart Failure Rounding Note  PCP-Cardiologist: None   Subjective:    More dyspneic today. Very fatigued.   Co-ox 64%.   CVP 10-11  Scr stable at 1.24.   Is/Os not accurate. Several unmeasured voids. ? Weights.  Hgb 9.6  > 8.4 > 7.8 > 8.4. Now off heparin.     Objective:   Weight Range: 96 kg Body mass index is 33.15 kg/m.   Vital Signs:   Temp:  [98 F (36.7 C)-98.8 F (37.1 C)] 98.5 F (36.9 C) (11/02 0957) Pulse Rate:  [77-95] 95 (11/02 0957) Resp:  [19-29] 29 (11/02 0957) BP: (105-133)/(49-88) 126/67 (11/02 0957) SpO2:  [96 %-99 %] 97 % (11/02 0745) Weight:  [96 kg] 96 kg (11/02 0500)    Weight change: Filed Weights   11/27/20 1838 11/28/20 0630 12/01/20 0500  Weight: 99.8 kg 103 kg 96 kg    Intake/Output:   Intake/Output Summary (Last 24 hours) at 12/01/2020 1052 Last data filed at 12/01/2020 0900 Gross per 24 hour  Intake 720 ml  Output 1450 ml  Net -730 ml      Physical Exam  CVP 10-11 General:  Fatigued appearing elderly female. HEENT: normal Neck: supple. JVP 10-12. Carotids 2+ bilat; no bruits. No lymphadenopathy or thryomegaly appreciated. Cor: PMI nondisplaced. Regular rate & rhythm. No rubs, gallops or murmurs. Lungs: clear Abdomen: soft, nontender, nondistended. No hepatosplenomegaly. No bruits or masses. Good bowel sounds. Extremities: no cyanosis, clubbing, rash, 1+ edema, TED hose on, LUE PICC Neuro: alert & orientedx3, cranial nerves grossly intact. moves all 4 extremities w/o difficulty. Affect pleasant    Telemetry   NSR 80s-90s (personally reviewed)  Labs    CBC Recent Labs    11/30/20 0037 12/01/20 0430  WBC 8.5 10.2  HGB 7.8* 8.4*  HCT 24.4* 26.1*  MCV 91.0 89.4  PLT 186 [id number]   Basic Metabolic Panel Recent Labs    11/30/20 0037 12/01/20 0430  NA 131* 134*  K 4.2 3.7  CL 97* 97*  CO2 26 28  GLUCOSE 148* 170*  BUN 20 23  CREATININE 1.28* 1.24*  CALCIUM 8.2* 8.5*  MG 2.3 2.3   Liver  Function Tests No results for input(s): AST, ALT, ALKPHOS, BILITOT, PROT, ALBUMIN in the last 72 hours.  No results for input(s): LIPASE, AMYLASE in the last 72 hours. Cardiac Enzymes No results for input(s): CKTOTAL, CKMB, CKMBINDEX, TROPONINI in the last 72 hours.  BNP: BNP (last 3 results) No results for input(s): BNP in the last 8760 hours.  ProBNP (last 3 results) No results for input(s): PROBNP in the last 8760 hours.   D-Dimer No results for input(s): DDIMER in the last 72 hours. Hemoglobin A1C No results for input(s): HGBA1C in the last 72 hours.  Fasting Lipid Panel Recent Labs    11/29/20 0058  LDLDIRECT 60.7   Thyroid Function Tests No results for input(s): TSH, T4TOTAL, T3FREE, THYROIDAB in the last 72 hours.  Invalid input(s): FREET3  Other results:   Imaging    No results found.   Medications:     Scheduled Medications:  aspirin  81 mg Oral Daily   atorvastatin  80 mg Oral Daily   Chlorhexidine Gluconate Cloth  6 each Topical Daily   clopidogrel  75 mg Oral Daily   furosemide  80 mg Intravenous BID   insulin aspart  0-15 Units Subcutaneous TID WC   insulin aspart  0-5 Units Subcutaneous QHS   mouth rinse  15 mL Mouth  Rinse BID   metolazone  2.5 mg Oral Once   midodrine  5 mg Oral TID WC   mupirocin ointment  1 application Nasal BID   potassium chloride  40 mEq Oral BID   predniSONE  40 mg Oral Q breakfast   sodium chloride flush  10-40 mL Intracatheter Q12H   sodium chloride flush  3 mL Intravenous Q12H    Infusions:  sodium chloride      PRN Medications: sodium chloride, acetaminophen, ondansetron (ZOFRAN) IV, sodium chloride flush, sodium chloride flush    Patient Profile   75 y.o. female with history of CAD s/p prior MI in 2004 (details not known), PAD, DM, HTN, HLD. Now admitted with NSTEMI c/b acute systolic HF.  Assessment/Plan   Acute systolic HF: -2/2 NSTEMI -LVEDP 26 mmHg at time of LHC -Echo LVEF 35-40%, RV  okay -Co-ox 64%, not on inotrope -CVP 10-11 -On IV lasix 80 mg BID. Appears volume up and more dyspneic. Will give 2.5 mg metolazone once. Scr stable 1.24. K 3.7 > replace. Mag okay. -Plan for RHC tomorrow to better assess CO and filling pressures -Developed severe hypotension 10/31 after medications. Given 250 cc bolus NS. All GDMT on hold. -Continue midodrine 5 mg TID.  -Ted hose   2. NSTEMI: -Known history of CAD with prior MI in 2004 in [place] (details not known) -Severe diffuse 3 V CAD on LHC this admit.  -Dr. Haroldine Laws reviewed cath films with Dr. Ali Lowe. PCI of ostial Lcx would be very high-risk with potential plaque shift into LM and LAD. Functional status significantly limited at baseline. Medical therapy seems to be best option at this time.  -HS troponin up to 1,249 -ASA + plavix -Heparin stopped d/t hemoglobin 8.4. hgb  -Continue Atorvastatin 80. LDL 60   3. Type II DM: -A1c 7.3 -Consider Farxiga prior to D/C -SSI   4. PAD: -Hx Viabahn covered stent left SFA, stent left TP trunk, PTA left posterior tibial artery 09/20 d/t CLI - ASA + statin   5. Anemia: -Hgb 9.6 > 8.4 > 7.8 > 8.4, Hgb baseline has been in 10s on chart review. Transfuse for Hgb < 7 -Iron stores okay -Continue to monitor -No obvious source of bleeding  6. Possible gout -Uric acid 9.3 -Right ankle pain, Treating with prednisone burst X 3 days  GOC: Remains tenuous. Concerned about her severe weakness and frailty. Dr. Haroldine Laws had discussion with her granddaughter who was at bedside and daughter, Brittney [name], on the phone. Will consult palliative care to assist with goals of care discussion. If not progressing favorably over the next few days, may be headed towards more palliative approach   Disposition: HH/PT recommended at discharge CR consult   Length of Stay: Brittney [name], Crown, PA-C  12/01/2020, 10:52 AM  Advanced Heart Failure Team Pager [phone] (M-F; 7a - 5p)  Please contact [facility] for night-coverage after hours (5p -7a ) and weekends on [email or web address]  Patient seen and examined with the above-signed Advanced Practice Provider and/or Housestaff. I personally reviewed laboratory data, imaging studies and relevant notes. I independently examined the patient and formulated the important aspects of the plan. I have edited the note to reflect any of my changes or salient points. I have personally discussed the plan with the patient and/or family.  Sitting up in chair. Very weak. Denies CP. Says SOB comes and goes.   Co-ox 64% CVP 10-11.   General:  Weak appearing. No resp difficulty HEENT: normal Neck:  supple. JVP 10 Carotids 2+ bilat; no bruits. No lymphadenopathy or thryomegaly appreciated. Cor: PMI nondisplaced. Regular rate & rhythm. No rubs, gallops or murmurs. Lungs: clear Abdomen: obese soft, nontender, nondistended. No hepatosplenomegaly. No bruits or masses. Good bowel sounds. Extremities: no cyanosis, clubbing, rash, tr edema Neuro: alert & orientedx3, cranial nerves grossly intact. moves all 4 extremities w/o difficulty. Affect pleasant  I remain very concerned about her due to increasing weakness, fatigue and dyspnea. Will continue diuresis and plan RHC tomorrow. She is not candidate for PCI.   Discussed trajectory with daughter (by phone) and granddaughter (in person). We talked about possible SNF and also palliative care.   Her granddaughter has been her primary caretaker and says that prior to hospitalization was able to get around some and would like to see her get back to that.   Will consult Palliative Care to help with GOC.   Arvilla Meres, MD  4:37 PM

## 2020-12-02 ENCOUNTER — Encounter (HOSPITAL_COMMUNITY)
Admission: EM | Disposition: A | Discharge status: Skilled Nursing Facility | Payer: Self-pay | Source: Home / Self Care | Attending: Internal Medicine

## 2020-12-02 ENCOUNTER — Encounter (HOSPITAL_COMMUNITY): Payer: Self-pay | Admitting: Internal Medicine

## 2020-12-02 ENCOUNTER — Inpatient Hospital Stay (HOSPITAL_COMMUNITY): Payer: Medicare Other

## 2020-12-02 DIAGNOSIS — D649 Anemia, unspecified: Secondary | ICD-10-CM | POA: Diagnosis not present

## 2020-12-02 DIAGNOSIS — E119 Type 2 diabetes mellitus without complications: Secondary | ICD-10-CM | POA: Diagnosis not present

## 2020-12-02 DIAGNOSIS — I214 Non-ST elevation (NSTEMI) myocardial infarction: Secondary | ICD-10-CM | POA: Diagnosis not present

## 2020-12-02 DIAGNOSIS — I340 Nonrheumatic mitral (valve) insufficiency: Secondary | ICD-10-CM | POA: Diagnosis not present

## 2020-12-02 DIAGNOSIS — I5021 Acute systolic (congestive) heart failure: Secondary | ICD-10-CM | POA: Diagnosis not present

## 2020-12-02 HISTORY — PX: RIGHT HEART CATH: CATH118263

## 2020-12-02 LAB — POCT I-STAT EG7
Acid-Base Excess: 6 mmol/L — ABNORMAL HIGH (ref 0.0–2.0)
Acid-Base Excess: 9 mmol/L — ABNORMAL HIGH (ref 0.0–2.0)
Bicarbonate: 29.7 mmol/L — ABNORMAL HIGH (ref 20.0–28.0)
Bicarbonate: 33.9 mmol/L — ABNORMAL HIGH (ref 20.0–28.0)
Calcium, Ion: 0.94 mmol/L — ABNORMAL LOW (ref 1.15–1.40)
Calcium, Ion: 1.1 mmol/L — ABNORMAL LOW (ref 1.15–1.40)
HCT: 25 % — ABNORMAL LOW (ref 36.0–46.0)
HCT: 28 % — ABNORMAL LOW (ref 36.0–46.0)
Hemoglobin: 8.5 g/dL — ABNORMAL LOW (ref 12.0–15.0)
Hemoglobin: 9.5 g/dL — ABNORMAL LOW (ref 12.0–15.0)
O2 Saturation: 62 %
O2 Saturation: 63 %
Potassium: 3.3 mmol/L — ABNORMAL LOW (ref 3.5–5.1)
Potassium: 3.8 mmol/L (ref 3.5–5.1)
Sodium: 136 mmol/L (ref 135–145)
Sodium: 141 mmol/L (ref 135–145)
TCO2: 31 mmol/L (ref 22–32)
TCO2: 35 mmol/L — ABNORMAL HIGH (ref 22–32)
pCO2, Ven: 40.9 mmHg — ABNORMAL LOW (ref 44.0–60.0)
pCO2, Ven: 45.1 mmHg (ref 44.0–60.0)
pH, Ven: 7.47 — ABNORMAL HIGH (ref 7.250–7.430)
pH, Ven: 7.484 — ABNORMAL HIGH (ref 7.250–7.430)
pO2, Ven: 30 mmHg — CL (ref 32.0–45.0)
pO2, Ven: 31 mmHg — CL (ref 32.0–45.0)

## 2020-12-02 LAB — BASIC METABOLIC PANEL
Anion gap: 8 (ref 5–15)
BUN: 26 mg/dL — ABNORMAL HIGH (ref 8–23)
CO2: 31 mmol/L (ref 22–32)
Calcium: 9 mg/dL (ref 8.9–10.3)
Chloride: 95 mmol/L — ABNORMAL LOW (ref 98–111)
Creatinine, Ser: 1.22 mg/dL — ABNORMAL HIGH (ref 0.44–1.00)
GFR, Estimated: 46 mL/min — ABNORMAL LOW (ref >60)
Glucose, Bld: 176 mg/dL — ABNORMAL HIGH (ref 70–99)
Potassium: 4 mmol/L (ref 3.5–5.1)
Sodium: 134 mmol/L — ABNORMAL LOW (ref 135–145)

## 2020-12-02 LAB — ECHOCARDIOGRAM LIMITED
Height: 67 in
S' Lateral: 3.8 cm
Weight: 3386.27 oz

## 2020-12-02 LAB — MAGNESIUM: Magnesium: 2.2 mg/dL (ref 1.7–2.4)

## 2020-12-02 LAB — CBC
HCT: 28 % — ABNORMAL LOW (ref 36.0–46.0)
Hemoglobin: 9.2 g/dL — ABNORMAL LOW (ref 12.0–15.0)
MCH: 29 pg (ref 26.0–34.0)
MCHC: 32.9 g/dL (ref 30.0–36.0)
MCV: 88.3 fL (ref 80.0–100.0)
Platelets: 206 10*3/uL (ref 150–400)
RBC: 3.17 MIL/uL — ABNORMAL LOW (ref 3.87–5.11)
RDW: 15.1 % (ref 11.5–15.5)
WBC: 10 10*3/uL (ref 4.0–10.5)
nRBC: 0 % (ref 0.0–0.2)

## 2020-12-02 LAB — GLUCOSE, CAPILLARY
Glucose-Capillary: 176 mg/dL — ABNORMAL HIGH (ref 70–99)
Glucose-Capillary: 173 mg/dL — ABNORMAL HIGH (ref 70–99)
Glucose-Capillary: 264 mg/dL — ABNORMAL HIGH (ref 70–99)
Glucose-Capillary: 354 mg/dL — ABNORMAL HIGH (ref 70–99)

## 2020-12-02 LAB — COOXEMETRY PANEL
Carboxyhemoglobin: 1.4 % (ref 0.5–1.5)
Methemoglobin: 1 % (ref 0.0–1.5)
O2 Saturation: 63.6 %
Total hemoglobin: 8.4 g/dL — ABNORMAL LOW (ref 12.0–16.0)

## 2020-12-02 LAB — PROTIME-INR
INR: 1 (ref 0.8–1.2)
Prothrombin Time: 13.5 seconds (ref 11.4–15.2)

## 2020-12-02 SURGERY — RIGHT HEART CATH
Anesthesia: LOCAL

## 2020-12-02 MED ORDER — LIDOCAINE HCL (PF) 1 % IJ SOLN
INTRAMUSCULAR | Status: DC | PRN
  Administered 2020-12-02: 2 mL

## 2020-12-02 MED ORDER — HEPARIN (PORCINE) IN NACL 1000-0.9 UT/500ML-% IV SOLN
INTRAVENOUS | Status: DC | PRN
  Administered 2020-12-02: 500 mL

## 2020-12-02 MED ORDER — HEPARIN (PORCINE) IN NACL 1000-0.9 UT/500ML-% IV SOLN
INTRAVENOUS | Status: AC
  Filled 2020-12-02: qty 500

## 2020-12-02 MED ORDER — METOLAZONE 2.5 MG PO TABS
2.5000 mg | ORAL_TABLET | Freq: Every day | ORAL | Status: DC
  Administered 2020-12-02: 2.5 mg via ORAL
  Filled 2020-12-02 (×2): qty 1

## 2020-12-02 MED ORDER — LIDOCAINE HCL (PF) 1 % IJ SOLN
INTRAMUSCULAR | Status: AC
  Filled 2020-12-02: qty 30

## 2020-12-02 MED ORDER — SPIRONOLACTONE 12.5 MG HALF TABLET
12.5000 mg | ORAL_TABLET | Freq: Every day | ORAL | Status: DC
  Administered 2020-12-02 – 2020-12-09 (×8): 12.5 mg via ORAL
  Filled 2020-12-02 (×7): qty 1

## 2020-12-02 SURGICAL SUPPLY — 6 items
CATH SWAN GANZ 7F STRAIGHT (CATHETERS) ×2 IMPLANT
GLIDESHEATH SLENDER 7FR .021G (SHEATH) ×2 IMPLANT
KIT MICROPUNCTURE NIT STIFF (SHEATH) ×2 IMPLANT
PACK CARDIAC CATHETERIZATION (CUSTOM PROCEDURE TRAY) ×2 IMPLANT
TRANSDUCER W/STOPCOCK (MISCELLANEOUS) ×2 IMPLANT
WIRE EMERALD 3MM-J .025X260CM (WIRE) ×2 IMPLANT

## 2020-12-02 NOTE — Progress Notes (Signed)
  Echocardiogram 2D Echocardiogram has been performed.  Brittney [name] 12/02/2020, 11:43 AM

## 2020-12-02 NOTE — Progress Notes (Signed)
Advanced Heart Failure Rounding Note  PCP-Cardiologist: None   Subjective:    Was in chair most of day yesterday.  Continues to feel very weak. Says breathing "comes and goes". Denies orthopnea or PND. Weight coming down with IV lasix. Denies CP.   SBP stable on midodrine  CVP 8  Co-ox 64%  RHC today   RA = 8 RV = 53/11 PA = 56/21 (38) PCW = 30 (v waves = 45) Fick cardiac output/index = 6.9/3.3 Thermo CO/CI = 5.8/2.8 PVR = 1.2 WU Ao sat = 95% PA sat = 62%, 63%   Objective:   Weight Range: 96 kg Body mass index is 33.15 kg/m.   Vital Signs:   Temp:  [98.1 F (36.7 C)-98.7 F (37.1 C)] 98.6 F (37 C) (11/03 0716) Pulse Rate:  [75-85] 83 (11/03 0716) Resp:  [17-30] 22 (11/03 0716) BP: (99-134)/(47-101) 133/66 (11/03 0716) SpO2:  [94 %-98 %] 95 % (11/03 0716)    Weight change: Filed Weights   11/27/20 1838 11/28/20 0630 12/01/20 0500  Weight: 99.8 kg 103 kg 96 kg    Intake/Output:   Intake/Output Summary (Last 24 hours) at 12/02/2020 1028 Last data filed at 12/02/2020 0700 Gross per 24 hour  Intake 1076 ml  Output 2750 ml  Net -1674 ml       Physical Exam   General:  Fatigued appearing elderly female. HEENT: normal Neck: supple. JVP 8  Carotids 2+ bilat; no bruits. No lymphadenopathy or thryomegaly appreciated. Cor: PMI nondisplaced. Regular rate & rhythm. 2/6 MR Lungs: clear Abdomen: obese soft, nontender, nondistended. No hepatosplenomegaly. No bruits or masses. Good bowel sounds. Extremities: no cyanosis, clubbing, rash, tr edema Neuro: alert & orientedx3, cranial nerves grossly intact. moves all 4 extremities w/o difficulty. Affect pleasant   Telemetry   NSR 80-90 Personally reviewed  Labs    CBC Recent Labs    12/01/20 0430 12/02/20 0510  WBC 10.2 10.0  HGB 8.4* 9.2*  HCT 26.1* 28.0*  MCV 89.4 88.3  PLT 184 [id number]    Basic Metabolic Panel Recent Labs    12/01/20 0430 12/02/20 0510  NA 134* 134*  K 3.7 4.0  CL 97* 95*   CO2 28 31  GLUCOSE 170* 176*  BUN 23 26*  CREATININE 1.24* 1.22*  CALCIUM 8.5* 9.0  MG 2.3 2.2    Liver Function Tests No results for input(s): AST, ALT, ALKPHOS, BILITOT, PROT, ALBUMIN in the last 72 hours.  No results for input(s): LIPASE, AMYLASE in the last 72 hours. Cardiac Enzymes No results for input(s): CKTOTAL, CKMB, CKMBINDEX, TROPONINI in the last 72 hours.  BNP: BNP (last 3 results) No results for input(s): BNP in the last 8760 hours.  ProBNP (last 3 results) No results for input(s): PROBNP in the last 8760 hours.   D-Dimer No results for input(s): DDIMER in the last 72 hours. Hemoglobin A1C No results for input(s): HGBA1C in the last 72 hours.  Fasting Lipid Panel No results for input(s): CHOL, HDL, LDLCALC, TRIG, CHOLHDL, LDLDIRECT in the last 72 hours.  Thyroid Function Tests No results for input(s): TSH, T4TOTAL, T3FREE, THYROIDAB in the last 72 hours.  Invalid input(s): FREET3  Other results:   Imaging    No results found.   Medications:     Scheduled Medications:  aspirin  81 mg Oral Daily   atorvastatin  80 mg Oral Daily   Chlorhexidine Gluconate Cloth  6 each Topical Daily   clopidogrel  75 mg Oral Daily  furosemide  80 mg Intravenous BID   insulin aspart  0-15 Units Subcutaneous TID WC   insulin aspart  0-5 Units Subcutaneous QHS   mouth rinse  15 mL Mouth Rinse BID   midodrine  5 mg Oral TID WC   mupirocin ointment  1 application Nasal BID   sodium chloride flush  10-40 mL Intracatheter Q12H   sodium chloride flush  3 mL Intravenous Q12H    Infusions:  sodium chloride      PRN Medications: sodium chloride, acetaminophen, ondansetron (ZOFRAN) IV, sodium chloride flush, sodium chloride flush    Patient Profile   75 y.o. female with history of CAD s/p prior MI in 2004 (details not known), PAD, DM, HTN, HLD. Now admitted with NSTEMI c/b acute systolic HF.  Assessment/Plan   Acute systolic HF: -2/2 NSTEMI -LVEDP 26  mmHg at time of LHC -Echo LVEF 35-40%, RV okay -Co-ox 64%, not on inotrope - RHC today shows elevated filling pressures with very prominent v-waves in PCW tracing suggestive of significant MR (likely ischemic) - continue diuresis - plan TTE to further evaluate. May need TEE - On midrodrine to support BP. GDMT is limited   2. NSTEMI: -Known history of CAD with prior MI in 2004 in [place] (details not known) -Severe diffuse 3 V CAD on LHC this admit.  - Cath films reviewed with Dr. Lynnette Caffey. PCI of ostial Lcx would be very high-risk with potential plaque shift into LM and LAD. Functional status significantly limited at baseline. Medical therapy only option at this time.  -HS troponin up to 1,249 -ASA + plavix -Continue Atorvastatin 80. LDL 60   3. Type II DM: -A1c 7.3 -Consider Farxiga prior to D/C -SSI   4. PAD: -Hx Viabahn covered stent left SFA, stent left TP trunk, PTA left posterior tibial artery 09/20 d/t CLI - ASA + statin   5. Anemia: - Stable  -Iron stores okay -Continue to monitor -No obvious source of bleeding  6. Possible gout -Uric acid 9.3 -Right ankle pain, Treating with prednisone burst X 3 days  GOC: Remains tenuous. Concerned about her severe weakness and frailty. We had discussion with her granddaughter who was at bedside and daughter, Rodney Booze, on the phone. Will need PT/OT. Palliative care consulted as well for help with GOC.    Disposition: HH/PT recommended at discharge CR consult   Length of Stay: 5  Arvilla Meres, MD  12/02/2020, 10:28 AM  Advanced Heart Failure Team Pager [phone] (M-F; 7a - 5p)  Please contact [facility] for night-coverage after hours (5p -7a ) and weekends on [email or web address]

## 2020-12-02 NOTE — Interval H&P Note (Signed)
History and Physical Interval Note:  12/02/2020 9:47 AM  Brittney [name]  has presented today for surgery, with the diagnosis of heart failure.  The various methods of treatment have been discussed with the patient and family. After consideration of risks, benefits and other options for treatment, the patient has consented to  Procedure(s): RIGHT HEART CATH (N/A) as a surgical intervention.  The patient's history has been reviewed, patient examined, no change in status, stable for surgery.  I have reviewed the patient's chart and labs.  Questions were answered to the patient's satisfaction.     Daphna Lafuente

## 2020-12-02 NOTE — TOC CM/SW Note (Addendum)
HF TOC CM received notification from [facility] referral was accepted for [facility]. Will call [facility] for DME on day prior to dc. Will have meds come from [facility] pharmacy at dc. Palliative care consult in place for goals of care. Waiting recommendations for Palliative Care.  Isidoro Donning RN3 CCM, Heart Failure TOC CM [phone]

## 2020-12-02 NOTE — Progress Notes (Signed)
Physical Therapy Treatment Patient Details Name: Brittney [name] MRN: [id number] DOB: 1945-02-29 Today's Date: 12/02/2020   History of Present Illness Pt is a 75 y.o. female admitted on 10/29 with worsening SOB and chest tightness. Found to a multi-vessel CAD with NSTEMI. Not a surgical candidate at this time. Right heart cath performed on 11/3. PMH includes CAD, DM II, DTV, HLD, HTN, and CVA.    PT Comments    Pt progressing slowly with mobility. Today's session focused on seated and standing therapeutic exercise as patient reported she was too fatigued for ambulation. Pt would benefit from further acute PT to continue progressing towards short-term goals. Continue to recommend [facility] PT after d/c for further progression of functional mobility and activity tolerance.    Recommendations for follow up therapy are one component of a multi-disciplinary discharge planning process, led by the attending physician.  Recommendations may be updated based on patient status, additional functional criteria and insurance authorization.  Follow Up Recommendations  Home health PT     Assistance Recommended at Discharge    Equipment Recommendations  TBD (possibly rollator and w/c)   Recommendations for Other Services       Precautions / Restrictions Precautions Precautions: Fall Restrictions Weight Bearing Restrictions: No     Mobility  Bed Mobility               General bed mobility comments: pt received in recliner    Transfers Overall transfer level: Needs assistance Equipment used: Rolling walker (2 wheels) Transfers: Sit to/from Stand Sit to Stand: Min assist           General transfer comment: min A for elevation and stability, cues to use UE to help push from chair, increased time and effort for task    Ambulation/Gait                 Stairs             Wheelchair Mobility    Modified Rankin (Stroke Patients Only)       Balance Overall balance  assessment: Needs assistance Sitting-balance support: Feet supported Sitting balance-Leahy Scale: Good Sitting balance - Comments: Pt able to maintain static balance at edge of chair and can shift weight   Standing balance support: Bilateral upper extremity supported;During functional activity Standing balance-Leahy Scale: Fair Standing balance comment: Pt able to march while holding RW and min gaurd for safety with no LOB                            Cognition Arousal/Alertness: Awake/alert Behavior During Therapy: WFL for tasks assessed/performed Overall Cognitive Status: Within Functional Limits for tasks assessed                                 General Comments: pt able to follow all cues, required gentle encouragement to partcipate        Exercises General Exercises - Lower Extremity Long Arc Quad: AROM;Both;10 reps Hip Flexion/Marching: AROM;Both;10 reps Heel Raises: AROM;20 reps;Both    General Comments General comments (skin integrity, edema, etc.): Pt very fatigued today but still able to stand and march. Declined further standing activities. 2L O2 throughout treatment.      Pertinent Vitals/Pain Pain Assessment: No/denies pain    Home Living Family/patient expects to be discharged to:: Private residence Living Arrangements: Other relatives Available Help at Discharge: Family;Available 24 hours/day  Home Access: Stairs to enter Entrance Stairs-Rails: None Entrance Stairs-Number of Steps: 3   Home Layout: One level Home Equipment: Agricultural consultant (2 wheels);Cane - single point;Wheelchair - manual Additional Comments: pt states the wc can fit through the doorways ar home, RW will not fit into bathroom    Prior Function            PT Goals (current goals can now be found in the care plan section) Acute Rehab PT Goals Patient Stated Goal: to return home PT Goal Formulation: With patient Time For Goal Achievement: 12/16/20 Potential to  Achieve Goals: Fair    Frequency    Min 3X/week      PT Plan      Co-evaluation              AM-PAC PT "6 Clicks" Mobility   Outcome Measure  Help needed turning from your back to your side while in a flat bed without using bedrails?: A Little Help needed moving from lying on your back to sitting on the side of a flat bed without using bedrails?: A Little Help needed moving to and from a bed to a chair (including a wheelchair)?: A Little Help needed standing up from a chair using your arms (e.g., wheelchair or bedside chair)?: A Little Help needed to walk in hospital room?: A Lot Help needed climbing 3-5 steps with a railing? : A Lot 6 Click Score: 16    End of Session Equipment Utilized During Treatment: Gait belt;Oxygen Activity Tolerance: Patient limited by fatigue Patient left: in chair;with call bell/phone within reach Nurse Communication: Mobility status PT Visit Diagnosis: Other abnormalities of gait and mobility (R26.89);Muscle weakness (generalized) (M62.81)     Time: 6384-6659 PT Time Calculation (min) (ACUTE ONLY): 16 min  Charges:  $Therapeutic Exercise: 8-22 mins                    Daryel November, SPT    Daryel November 12/02/2020, 4:14 PM

## 2020-12-02 NOTE — Plan of Care (Signed)
  Problem: Education: Goal: Knowledge of General Education information will improve Description Including pain rating scale, medication(s)/side effects and non-pharmacologic comfort measures Outcome: Progressing   Problem: Health Behavior/Discharge Planning: Goal: Ability to manage health-related needs will improve Outcome: Progressing   

## 2020-12-03 ENCOUNTER — Inpatient Hospital Stay (HOSPITAL_COMMUNITY): Payer: Medicare Other

## 2020-12-03 ENCOUNTER — Encounter (HOSPITAL_COMMUNITY)
Admission: EM | Disposition: A | Discharge status: Skilled Nursing Facility | Payer: Self-pay | Source: Home / Self Care | Attending: Internal Medicine

## 2020-12-03 ENCOUNTER — Encounter (HOSPITAL_COMMUNITY): Payer: Self-pay | Admitting: Internal Medicine

## 2020-12-03 ENCOUNTER — Inpatient Hospital Stay (HOSPITAL_COMMUNITY): Payer: Medicare Other | Admitting: Anesthesiology

## 2020-12-03 DIAGNOSIS — I214 Non-ST elevation (NSTEMI) myocardial infarction: Secondary | ICD-10-CM | POA: Diagnosis not present

## 2020-12-03 DIAGNOSIS — I5021 Acute systolic (congestive) heart failure: Secondary | ICD-10-CM | POA: Diagnosis not present

## 2020-12-03 DIAGNOSIS — E119 Type 2 diabetes mellitus without complications: Secondary | ICD-10-CM | POA: Diagnosis not present

## 2020-12-03 DIAGNOSIS — D649 Anemia, unspecified: Secondary | ICD-10-CM | POA: Diagnosis not present

## 2020-12-03 HISTORY — PX: TEE WITHOUT CARDIOVERSION: SHX5443

## 2020-12-03 LAB — CBC
HCT: 29.4 % — ABNORMAL LOW (ref 36.0–46.0)
Hemoglobin: 9.8 g/dL — ABNORMAL LOW (ref 12.0–15.0)
MCH: 29.2 pg (ref 26.0–34.0)
MCHC: 33.3 g/dL (ref 30.0–36.0)
MCV: 87.5 fL (ref 80.0–100.0)
Platelets: 215 K/uL (ref 150–400)
RBC: 3.36 MIL/uL — ABNORMAL LOW (ref 3.87–5.11)
RDW: 14.9 % (ref 11.5–15.5)
WBC: 14.3 K/uL — ABNORMAL HIGH (ref 4.0–10.5)
nRBC: 0 % (ref 0.0–0.2)

## 2020-12-03 LAB — BASIC METABOLIC PANEL WITH GFR
Anion gap: 9 (ref 5–15)
BUN: 29 mg/dL — ABNORMAL HIGH (ref 8–23)
CO2: 33 mmol/L — ABNORMAL HIGH (ref 22–32)
Calcium: 9 mg/dL (ref 8.9–10.3)
Chloride: 89 mmol/L — ABNORMAL LOW (ref 98–111)
Creatinine, Ser: 1.25 mg/dL — ABNORMAL HIGH (ref 0.44–1.00)
GFR, Estimated: 45 mL/min — ABNORMAL LOW (ref >60)
Glucose, Bld: 200 mg/dL — ABNORMAL HIGH (ref 70–99)
Potassium: 3.8 mmol/L (ref 3.5–5.1)
Sodium: 131 mmol/L — ABNORMAL LOW (ref 135–145)

## 2020-12-03 LAB — COOXEMETRY PANEL
Carboxyhemoglobin: 1.2 % (ref 0.5–1.5)
Methemoglobin: 0.8 % (ref 0.0–1.5)
O2 Saturation: 68 %
Total hemoglobin: 9.4 g/dL — ABNORMAL LOW (ref 12.0–16.0)

## 2020-12-03 LAB — GLUCOSE, CAPILLARY
Glucose-Capillary: 155 mg/dL — ABNORMAL HIGH (ref 70–99)
Glucose-Capillary: 150 mg/dL — ABNORMAL HIGH (ref 70–99)
Glucose-Capillary: 251 mg/dL — ABNORMAL HIGH (ref 70–99)
Glucose-Capillary: 162 mg/dL — ABNORMAL HIGH (ref 70–99)

## 2020-12-03 LAB — MAGNESIUM: Magnesium: 2.4 mg/dL (ref 1.7–2.4)

## 2020-12-03 SURGERY — ECHOCARDIOGRAM, TRANSESOPHAGEAL
Anesthesia: Monitor Anesthesia Care

## 2020-12-03 MED ORDER — PROPOFOL 10 MG/ML IV BOLUS
INTRAVENOUS | Status: DC | PRN
  Administered 2020-12-03: 10 mg via INTRAVENOUS
  Administered 2020-12-03: 20 mg via INTRAVENOUS
  Administered 2020-12-03 (×2): 10 mg via INTRAVENOUS

## 2020-12-03 MED ORDER — SODIUM CHLORIDE 0.45 % IV SOLN: INTRAVENOUS | Status: DC

## 2020-12-03 MED ORDER — PROPOFOL 500 MG/50ML IV EMUL
INTRAVENOUS | Status: DC | PRN
  Administered 2020-12-03: 50 ug/kg/min via INTRAVENOUS

## 2020-12-03 MED ORDER — ENOXAPARIN SODIUM 40 MG/0.4ML IJ SOSY
40.0000 mg | PREFILLED_SYRINGE | INTRAMUSCULAR | Status: DC
  Administered 2020-12-03 – 2020-12-06 (×4): 40 mg via SUBCUTANEOUS
  Filled 2020-12-03 (×4): qty 0.4

## 2020-12-03 MED ORDER — POTASSIUM CHLORIDE CRYS ER 20 MEQ PO TBCR
40.0000 meq | EXTENDED_RELEASE_TABLET | Freq: Once | ORAL | Status: AC
  Administered 2020-12-03: 40 meq via ORAL
  Filled 2020-12-03: qty 2

## 2020-12-03 MED ORDER — PHENYLEPHRINE 40 MCG/ML (10ML) SYRINGE FOR IV PUSH (FOR BLOOD PRESSURE SUPPORT)
PREFILLED_SYRINGE | INTRAVENOUS | Status: DC | PRN
  Administered 2020-12-03: 80 ug via INTRAVENOUS
  Administered 2020-12-03 (×2): 120 ug via INTRAVENOUS
  Administered 2020-12-03: 80 ug via INTRAVENOUS

## 2020-12-03 NOTE — CV Procedure (Signed)
    TRANSESOPHAGEAL ECHOCARDIOGRAM   NAME:  Brittney [name]   MRN: [id number] DOB:  1945/03/18   ADMIT DATE: 11/27/2020  INDICATIONS:   PROCEDURE:   Informed consent was obtained prior to the procedure. The risks, benefits and alternatives for the procedure were discussed and the patient comprehended these risks.  Risks include, but are not limited to, cough, sore throat, vomiting, nausea, somnolence, esophageal and stomach trauma or perforation, bleeding, low blood pressure, aspiration, pneumonia, infection, trauma to the teeth and death.    After a procedural time-out, the patient was sedated by the anesthesia service. IV neosynephrine was given to maintain SBP in the 120-135 range.  The transesophageal probe was inserted in the esophagus and stomach without difficulty and multiple views were obtained.    COMPLICATIONS:    There were no immediate complications.  FINDINGS:  LEFT VENTRICLE: EF = 35-40%.   RIGHT VENTRICLE: Mild HK  LEFT ATRIUM: Moderately dilated  LEFT ATRIAL APPENDAGE: No thrombus.   RIGHT ATRIUM: Normal  AORTIC VALVE:  Trileaflet. Moderately calcified. Trivial AI.   MITRAL VALVE:    Posterior leaflet mildly restricted particularly P3 segment. 2-3+ Moderate central MR. Minimal systolic flow reversal in pulmonary veins.   TRICUSPID VALVE: Normal. Mild TR  PULMONIC VALVE: Grossly normal.Trivial PI  INTERATRIAL SEPTUM: Small PFO  PERICARDIUM: No effusion  DESCENDING AORTA: Mild plaque    Truman Hayward 5:58 PM

## 2020-12-03 NOTE — Progress Notes (Signed)
Pt to Endo 

## 2020-12-03 NOTE — Progress Notes (Signed)
OT Cancellation Note  Patient Details Name: Brittney [name] MRN: [id number] DOB: [birth year]   Cancelled Treatment:    Reason Eval/Treat Not Completed: Other (comment). Spoke with RN prior to attempt.  Pt. Is NPO for TEE sch. At 12:00.  Rn reports pt. Is resting and struggling with her hunger. States pm attempt would be more productive.  Will check back as able.    Alessandra Bevels Lorraine-COTA/L 12/03/2020, 8:56 AM

## 2020-12-03 NOTE — Interval H&P Note (Signed)
History and Physical Interval Note:  12/03/2020 12:39 PM  Brittney [name]  has presented today for surgery, with the diagnosis of heart failure and mitral regurg.  The various methods of treatment have been discussed with the patient and family. After consideration of risks, benefits and other options for treatment, the patient has consented to  Procedure(s): TRANSESOPHAGEAL ECHOCARDIOGRAM (TEE) (N/A) as a surgical intervention.  The patient's history has been reviewed, patient examined, no change in status, stable for surgery.  I have reviewed the patient's chart and labs.  Questions were answered to the patient's satisfaction.     Fredrich Cory

## 2020-12-03 NOTE — Progress Notes (Signed)
  Echocardiogram Echocardiogram Transesophageal has been performed.  Roosvelt Maser F 12/03/2020, 1:19 PM

## 2020-12-03 NOTE — Anesthesia Procedure Notes (Signed)
Procedure Name: MAC Date/Time: 12/03/2020 12:39 PM Performed by: Erick Colace, CRNA Pre-anesthesia Checklist: Patient identified, Emergency Drugs available, Suction available, Patient being monitored and Timeout performed Patient Re-evaluated:Patient Re-evaluated prior to induction Oxygen Delivery Method: Nasal cannula Preoxygenation: Pre-oxygenation with 100% oxygen Induction Type: IV induction

## 2020-12-03 NOTE — Progress Notes (Signed)
  Advanced Heart Failure Rounding Note  PCP-Cardiologist: None   Subjective:    Diuresing with IV lasix and metolazone. Weight down 9 pounds overnight. Remains very weak. Continues with SOB at rest. Denies orthopnea or PND.   Echo 12/02/20 EF 30-35% mild MR  SBP stable on midodrine  CVP 5-6  Co-ox 68%  RHC 11/2//2  RA = 8 RV = 53/11 PA = 56/21 (38) PCW = 30 (v waves = 45) Fick cardiac output/index = 6.9/3.3 Thermo CO/CI = 5.8/2.8 PVR = 1.2 WU Ao sat = 95% PA sat = 62%, 63%   Objective:   Weight Range: 91.7 kg Body mass index is 31.66 kg/m.   Vital Signs:   Temp:  [97.7 F (36.5 C)-98.7 F (37.1 C)] 98.7 F (37.1 C) (11/04 1112) Pulse Rate:  [79-86] 86 (11/04 1112) Resp:  [15-21] 21 (11/04 1112) BP: (122-139)/(48-80) 131/48 (11/04 1112) SpO2:  [93 %-96 %] 96 % (11/04 1112) Weight:  [91.7 kg] 91.7 kg (11/04 0614)    Weight change: Filed Weights   12/01/20 0500 12/02/20 0500 12/03/20 0614  Weight: 96 kg 96 kg 91.7 kg    Intake/Output:   Intake/Output Summary (Last 24 hours) at 12/03/2020 1233 Last data filed at 12/03/2020 1015 Gross per 24 hour  Intake 600 ml  Output 3950 ml  Net -3350 ml       Physical Exam   General:  Fatigued appearing elderly female. HEENT: normal Neck: supple. JVP 5-6. Carotids 2+ bilat; no bruits. No lymphadenopathy or thryomegaly appreciated. Cor: PMI nondisplaced. Regular rate & rhythm. No rubs, gallops or murmurs. Lungs: clear Abdomen: obese soft, nontender, nondistended. No hepatosplenomegaly. No bruits or masses. Good bowel sounds. Extremities: no cyanosis, clubbing, rash, edema Neuro: alert & orientedx3, cranial nerves grossly intact. moves all 4 extremities w/o difficulty. Affect pleasant  Telemetry   NSR 80-90 Personally reviewed  Labs    CBC Recent Labs    12/02/20 0510 12/02/20 1001 12/03/20 0130  WBC 10.0  --  14.3*  HGB 9.2* 8.5*  9.5* 9.8*  HCT 28.0* 25.0*  28.0* 29.4*  MCV 88.3  --  87.5   PLT 206  --  215    Basic Metabolic Panel Recent Labs    12/02/20 0510 12/02/20 1001 12/03/20 0130  NA 134* 141  136 131*  K 4.0 3.3*  3.8 3.8  CL 95*  --  89*  CO2 31  --  33*  GLUCOSE 176*  --  200*  BUN 26*  --  29*  CREATININE 1.22*  --  1.25*  CALCIUM 9.0  --  9.0  MG 2.2  --  2.4    Liver Function Tests No results for input(s): AST, ALT, ALKPHOS, BILITOT, PROT, ALBUMIN in the last 72 hours.  No results for input(s): LIPASE, AMYLASE in the last 72 hours. Cardiac Enzymes No results for input(s): CKTOTAL, CKMB, CKMBINDEX, TROPONINI in the last 72 hours.  BNP: BNP (last 3 results) No results for input(s): BNP in the last 8760 hours.  ProBNP (last 3 results) No results for input(s): PROBNP in the last 8760 hours.   D-Dimer No results for input(s): DDIMER in the last 72 hours. Hemoglobin A1C No results for input(s): HGBA1C in the last 72 hours.  Fasting Lipid Panel No results for input(s): CHOL, HDL, LDLCALC, TRIG, CHOLHDL, LDLDIRECT in the last 72 hours.  Thyroid Function Tests No results for input(s): TSH, T4TOTAL, T3FREE, THYROIDAB in the last 72 hours.  Invalid input(s): FREET3  Other   results:   Imaging    No results found.   Medications:     Scheduled Medications:  [MAR Hold] aspirin  81 mg Oral Daily   [MAR Hold] atorvastatin  80 mg Oral Daily   [MAR Hold] Chlorhexidine Gluconate Cloth  6 each Topical Daily   [MAR Hold] clopidogrel  75 mg Oral Daily   [MAR Hold] enoxaparin (LOVENOX) injection  40 mg Subcutaneous Q24H   [MAR Hold] furosemide  80 mg Intravenous BID   [MAR Hold] insulin aspart  0-15 Units Subcutaneous TID WC   [MAR Hold] insulin aspart  0-5 Units Subcutaneous QHS   [MAR Hold] mouth rinse  15 mL Mouth Rinse BID   [MAR Hold] midodrine  5 mg Oral TID WC   [MAR Hold] potassium chloride  40 mEq Oral Once   [MAR Hold] sodium chloride flush  10-40 mL Intracatheter Q12H   [MAR Hold] sodium chloride flush  3 mL Intravenous Q12H    [MAR Hold] spironolactone  12.5 mg Oral Daily    Infusions:  sodium chloride     [MAR Hold] sodium chloride      PRN Medications: [MAR Hold] sodium chloride, [MAR Hold] acetaminophen, [MAR Hold] ondansetron (ZOFRAN) IV, [MAR Hold] sodium chloride flush, [MAR Hold] sodium chloride flush    Patient Profile   75 y.o. female with history of CAD s/p prior MI in 2004 (details not known), PAD, DM, HTN, HLD. Now admitted with NSTEMI c/b acute systolic HF.  Assessment/Plan   Acute systolic HF: -2/2 NSTEMI -LVEDP 26 mmHg at time of LHC -Echo LVEF 35-40%, RV okay - RHC today shows elevated filling pressures with very prominent v-waves in PCW tracing suggestive of significant MR (likely ischemic) - Repeat echo 11/01/20 EF 30-35% only mild MR - has diuresed well. CVP 5-6. Co-ox 68%  Can change to po torsemide - will plan TEE today to further evaluate for MR - On midrodrine to support BP. BP improved. Decrease midodrine to 2.5 tid - Continue spiro. Hopefully can add further GDMT after midodrine wean  - Supp k.    2. NSTEMI: -Known history of CAD with prior MI in 2004 in [place] (details not known) -Severe diffuse 3 V CAD on LHC this admit.  - Cath films reviewed with Dr. Ali Lowe. PCI of ostial Lcx would be very high-risk with potential plaque shift into LM and LAD. Functional status significantly limited at baseline. Medical therapy only option at this time.  -HS troponin up to 1,249 -ASA + plavix -Continue Atorvastatin 80. LDL 60 - No s/s angina    3. Type II DM: -A1c 7.3 -Consider Farxiga prior to D/C -SSI   4. PAD: -Hx Viabahn covered stent left SFA, stent left TP trunk, PTA left posterior tibial artery 09/20 d/t CLI - ASA + statin   5. Anemia: - Stable  - hgb 9.8 -Iron stores okay -Continue to monitor -No obvious source of bleeding  6. Possible gout -Uric acid 9.3 -Right ankle pain. Has finished prednisone   GOC: Remains tenuous. Concerned about her severe  weakness and frailty. We had discussion with her granddaughter who was at bedside and daughter, Aniceto Boss, on the phone. Have consulted PT/OT. Palliative care consulted as well for help with GOC.    Disposition: HH/PT recommended at discharge CR consult   Length of Stay: 6  Glori Bickers, MD  12/03/2020, 12:33 PM  Advanced Heart Failure Team Pager [phone] (M-F; 7a - 5p)  Please contact [facility] for night-coverage after hours (5p -7a )  and weekends on [email or web address]

## 2020-12-03 NOTE — Plan of Care (Signed)
  Problem: Health Behavior/Discharge Planning: Goal: Ability to manage health-related needs will improve Outcome: Not Progressing   Problem: Clinical Measurements: Goal: Ability to maintain clinical measurements within normal limits will improve Outcome: Not Progressing Goal: Diagnostic test results will improve Outcome: Not Progressing Goal: Respiratory complications will improve Outcome: Not Progressing   Problem: Activity: Goal: Risk for activity intolerance will decrease Outcome: Not Progressing   Problem: Coping: Goal: Level of anxiety will decrease Outcome: Not Progressing

## 2020-12-03 NOTE — Transfer of Care (Signed)
Immediate Anesthesia Transfer of Care Note  Patient: Brittney [name]  Procedure(s) Performed: TRANSESOPHAGEAL ECHOCARDIOGRAM (TEE)  Patient Location: PACU  Anesthesia Type:MAC  Level of Consciousness: awake  Airway & Oxygen Therapy: Patient Spontanous Breathing and Patient connected to nasal cannula oxygen  Post-op Assessment: Report given to RN and Post -op Vital signs reviewed and stable  Post vital signs: Reviewed and stable  Last Vitals:  Vitals Value Taken Time  BP 118/64 12/03/20 1315  Temp    Pulse 93 12/03/20 1316  Resp 19 12/03/20 1316  SpO2 95 % 12/03/20 1316  Vitals shown include unvalidated device data.  Last Pain:  Vitals:   12/03/20 1112  TempSrc: Oral  PainSc: 0-No pain      Patients Stated Pain Goal: 0 (12/02/13 9458)  Complications: No notable events documented.

## 2020-12-03 NOTE — Consult Note (Signed)
Palliative Medicine Inpatient Consult Note  Consulting Provider: Joette Catching, PA-C  Reason for consult:   [place]  Reason for Consult? GOC discussion   HPI:  Per intake H&P --> 75 y.o. female with history of CAD s/p prior MI in 2004 (details not known), PAD, DM, HTN, HLD. Now admitted with NSTEMI c/b acute systolic HF.  Palliative care has been requested to get involved to further discuss goals of care.  Clinical Assessment/Goals of Care:  *Please note that this is a verbal dictation therefore any spelling or grammatical errors are due to the "[place] [employer]" system interpretation.  I have reviewed medical records including EPIC notes, labs and imaging, received report from bedside RN, assessed the patient who is resting in bed comfortably in no acute distress.  She shares with me that she did not get very good sleep overnight.   I met with Doyce Para to further discuss diagnosis prognosis, GOC, EOL wishes, disposition and options.  A review of Tameshia's past medical history was had inclusive of her significant history of peripheral arterial disease in the setting of diabetes mellitus, hypertension, coronary artery disease with prior myocardial infarction and systolic heart failure.  I introduced Palliative Medicine as specialized medical care for people living with serious illness. It focuses on providing relief from the symptoms and stress of a serious illness. The goal is to improve quality of life for both the patient and the family.  Venessa shares with me that she lives in [place].  She is a widow.  She is a mother of 8 children.  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  She most enjoys her family.  She is a woman of faith and practices within the [employer] denomination.  Prior to admission Magin had been living in a single-family home with some of her grandchildren.   She was able to mobilize with a walker and a cane.  She could perform her own basic activities of daily living.  She has had a robust appetite.  Almyra Free expresses to me her reason for hospitalization as she was having some shortness of breath and chest tightness.  She expresses that she is aware that she has had a "heart attack".  We reviewed that she does have heart failure and the various stages that you go through as it progresses.    A detailed discussion was had today regarding advanced directives -patient has never completed these and would very much like to speak to her daughter who lives in [place] before doing so.    Concepts specific to code status, artifical feeding and hydration, continued IV antibiotics and rehospitalization was had.  Almyra Free expresses that she understands she would not survive a resuscitation effort though she would like some grace to speak with her family regarding this.  She shares that her son and daughter plan on coming into the hospital around 2 PM at which time we can further discuss this. ____________________________________ Addendum:  I went by Jalia's room this later afternoon as she shared with me her family would be present by 2PM. They had not yet come in therefore I asked Liddie if she had any questions from this morning which she kindly declined. We reviewed the importance of having ongoing conversations with her children about her current clinical condition.  Rosaline shares that she has been declining for the past year and has decreased overall endurance. We reviewed that all of  this is likely in the setting of her advanced disease which she acknowledges. We reviewed that her presence is important for her children though she does desire quality in her life.  We reviewed the advanced directives and MOST form which Carylon plans to share with her children. Plan for OP Palliative care FU.  [place] was able to mobilize OOB with me. She was SBA and appeared to do well  for the most part. She expressed feeling "hungry" due to not eating in 2 days. Her lunch was provided which she shared was "cold". I informed staff of this to help warm tray.   Discussed the importance of continued conversation with family and their  medical providers regarding overall plan of care and treatment options, ensuring decisions are within the context of the patients values and GOCs.  Decision Maker: Patient has eight children and is having a hard time electing one to make decisions. She has requested time to consider this further.  SUMMARY OF RECOMMENDATIONS   Full Code  --> wishes for time to discuss this with her children  Provided "Hard Choices for Loving People" and MOST form  Reviewed patients acute on chronic medical disease processes, discussed the importance of creating an advance directive in the setting of severe cardiac disease  Ongoing conversations in the oncoming days  Code Status/Advance Care Planning: FULL CODE   Palliative Prophylaxis:  Oral Care, Mobility  Additional Recommendations (Limitations, Scope, Preferences): Continue to treat the treatable   Psycho-social/Spiritual:  Desire for further Chaplaincy support: No Additional Recommendations: Education on progression of heart failure   Prognosis: Poor overall, ongoing decline > 1 year, multiple co-morbidities and increased frailty.   Discharge Planning: Discharge to home with [facility] when medically optimized. Will need OP Palliative support.  Vitals:   12/02/20 2329 12/03/20 0318  BP: 136/76 135/75  Pulse: 79 84  Resp: 18 15  Temp: 98.2 F (36.8 C) 98.2 F (36.8 C)  SpO2: 94% 93%    Intake/Output Summary (Last 24 hours) at 12/03/2020 8250 Last data filed at 12/03/2020 5397 Gross per 24 hour  Intake 1200 ml  Output 4050 ml  Net -2850 ml   Last Weight  Most recent update: 12/03/2020  6:15 AM    Weight  91.7 kg (202 lb 2.6 oz)            Gen:  [name] in NAD HEENT: moist mucous  membranes CV: Regular rate and rhythm  PULM: On  2LPM [place] ABD: soft/nontender  EXT: (+) BLE edema Neuro: Alert and oriented x3  PPS: 40%   This conversation/these recommendations were discussed with patient primary care team, Dr. Ali Lowe  Time In: 1400 Time Out:1530 Total Time:  72 Greater than 50%  of this time was spent counseling and coordinating care related to the above assessment and plan.  [place] Team Team Cell Phone: [phone] Please utilize secure chat with additional questions, if there is no response within 30 minutes please call the above phone number  Palliative Medicine Team providers are available by phone from 7am to 7pm daily and can be reached through the team cell phone.  Should this patient require assistance outside of these hours, please call the patient's attending physician.

## 2020-12-03 NOTE — Anesthesia Postprocedure Evaluation (Signed)
Anesthesia Post Note  Patient: Brittney [name]  Procedure(s) Performed: TRANSESOPHAGEAL ECHOCARDIOGRAM (TEE)     Patient location during evaluation: PACU Anesthesia Type: MAC Level of consciousness: awake and alert Pain management: pain level controlled Vital Signs Assessment: post-procedure vital signs reviewed and stable Respiratory status: spontaneous breathing, nonlabored ventilation, respiratory function stable and patient connected to nasal cannula oxygen Cardiovascular status: stable and blood pressure returned to baseline Postop Assessment: no apparent nausea or vomiting Anesthetic complications: no   No notable events documented.  Last Vitals:  Vitals:   12/03/20 1330 12/03/20 1345  BP: (!) 121/52 (!) 117/56  Pulse: 92 87  Resp: 20 19  Temp:  37.1 C  SpO2: 92% 92%    Last Pain:  Vitals:   12/03/20 1345  TempSrc:   PainSc: 0-No pain                 [name]

## 2020-12-03 NOTE — Anesthesia Preprocedure Evaluation (Addendum)
Anesthesia Evaluation  Patient identified by MRN, date of birth, ID band Patient awake    Reviewed: Allergy & Precautions, NPO status , Patient's Chart, lab work & pertinent test results  Airway Mallampati: III  TM Distance: >3 FB Neck ROM: Full    Dental  (+) Dental Advisory Given, Missing, Chipped, Poor Dentition   Pulmonary neg pulmonary ROS,    Pulmonary exam normal breath sounds clear to auscultation       Cardiovascular hypertension, + CAD, + Past MI, + Peripheral Vascular Disease and + DVT  Normal cardiovascular exam Rhythm:Regular Rate:Normal  TTE 2022 1. Left ventricular ejection fraction, by estimation, is 30 to 35%. The  left ventricle has moderately decreased function. The left ventricle  demonstrates regional wall motion abnormalities (see scoring  diagram/findings for description). There is  moderate left ventricular hypertrophy.  2. Right ventricular systolic function is normal.  3. Left atrial size was moderately dilated.  4. Right atrial size was mildly dilated.  5. Mild mitral valve regurgitation.  6. There is moderate calcification of the aortic valve.    Neuro/Psych CVA negative psych ROS   GI/Hepatic negative GI ROS, Neg liver ROS,   Endo/Other  negative endocrine ROSdiabetes  Renal/GU Renal InsufficiencyRenal diseaseLab Results      Component                Value               Date                      CREATININE               1.25 (H)            12/03/2020                BUN                      29 (H)              12/03/2020                NA                       131 (L)             12/03/2020                K                        3.8                 12/03/2020                CL                       89 (L)              12/03/2020                CO2                      33 (H)              12/03/2020             negative genitourinary   Musculoskeletal  (+) Arthritis ,   Abdominal    Peds  Hematology  (+) Blood dyscrasia, anemia , Lab Results      Component                Value               Date                      WBC                      14.3 (H)            12/03/2020                HGB                      9.8 (L)             12/03/2020                HCT                      29.4 (L)            12/03/2020                MCV                      87.5                12/03/2020                PLT                      215                 12/03/2020              Anesthesia Other Findings 75 y.o. female with history of CAD s/p prior MI in 2004 (details not known), PAD, DM, HTN, HLD. Now admitted with NSTEMI c/b acute systolic HF. TEE to evaluate MR.   Reproductive/Obstetrics                           Anesthesia Physical Anesthesia Plan  ASA: 3  Anesthesia Plan: MAC   Post-op Pain Management:    Induction: Intravenous  PONV Risk Score and Plan: Propofol infusion and Treatment may vary due to age or medical condition  Airway Management Planned: Natural Airway  Additional Equipment:   Intra-op Plan:   Post-operative Plan:   Informed Consent: I have reviewed the patients History and Physical, chart, labs and discussed the procedure including the risks, benefits and alternatives for the proposed anesthesia with the patient or authorized representative who has indicated his/her understanding and acceptance.     Dental advisory given  Plan Discussed with: CRNA  Anesthesia Plan Comments:         Anesthesia Quick Evaluation

## 2020-12-03 NOTE — H&P (View-Only) (Signed)
Advanced Heart Failure Rounding Note  PCP-Cardiologist: None   Subjective:    Diuresing with IV lasix and metolazone. Weight down 9 pounds overnight. Remains very weak. Continues with SOB at rest. Denies orthopnea or PND.   Echo 12/02/20 EF 30-35% mild MR  SBP stable on midodrine  CVP 5-6  Co-ox 68%  RHC 11/2//2  RA = 8 RV = 53/11 PA = 56/21 (38) PCW = 30 (v waves = 45) Fick cardiac output/index = 6.9/3.3 Thermo CO/CI = 5.8/2.8 PVR = 1.2 WU Ao sat = 95% PA sat = 62%, 63%   Objective:   Weight Range: 91.7 kg Body mass index is 31.66 kg/m.   Vital Signs:   Temp:  [97.7 F (36.5 C)-98.7 F (37.1 C)] 98.7 F (37.1 C) (11/04 1112) Pulse Rate:  [79-86] 86 (11/04 1112) Resp:  [15-21] 21 (11/04 1112) BP: (122-139)/(48-80) 131/48 (11/04 1112) SpO2:  [93 %-96 %] 96 % (11/04 1112) Weight:  [91.7 kg] 91.7 kg (11/04 0614)    Weight change: Filed Weights   12/01/20 0500 12/02/20 0500 12/03/20 0614  Weight: 96 kg 96 kg 91.7 kg    Intake/Output:   Intake/Output Summary (Last 24 hours) at 12/03/2020 1233 Last data filed at 12/03/2020 1015 Gross per 24 hour  Intake 600 ml  Output 3950 ml  Net -3350 ml       Physical Exam   General:  Fatigued appearing elderly female. HEENT: normal Neck: supple. JVP 5-6. Carotids 2+ bilat; no bruits. No lymphadenopathy or thryomegaly appreciated. Cor: PMI nondisplaced. Regular rate & rhythm. No rubs, gallops or murmurs. Lungs: clear Abdomen: obese soft, nontender, nondistended. No hepatosplenomegaly. No bruits or masses. Good bowel sounds. Extremities: no cyanosis, clubbing, rash, edema Neuro: alert & orientedx3, cranial nerves grossly intact. moves all 4 extremities w/o difficulty. Affect pleasant  Telemetry   NSR 80-90 Personally reviewed  Labs    CBC Recent Labs    12/02/20 0510 12/02/20 1001 12/03/20 0130  WBC 10.0  --  14.3*  HGB 9.2* 8.5*  9.5* 9.8*  HCT 28.0* 25.0*  28.0* 29.4*  MCV 88.3  --  87.5   PLT 206  --  123456    Basic Metabolic Panel Recent Labs    12/02/20 0510 12/02/20 1001 12/03/20 0130  NA 134* 141  136 131*  K 4.0 3.3*  3.8 3.8  CL 95*  --  89*  CO2 31  --  33*  GLUCOSE 176*  --  200*  BUN 26*  --  29*  CREATININE 1.22*  --  1.25*  CALCIUM 9.0  --  9.0  MG 2.2  --  2.4    Liver Function Tests No results for input(s): AST, ALT, ALKPHOS, BILITOT, PROT, ALBUMIN in the last 72 hours.  No results for input(s): LIPASE, AMYLASE in the last 72 hours. Cardiac Enzymes No results for input(s): CKTOTAL, CKMB, CKMBINDEX, TROPONINI in the last 72 hours.  BNP: BNP (last 3 results) No results for input(s): BNP in the last 8760 hours.  ProBNP (last 3 results) No results for input(s): PROBNP in the last 8760 hours.   D-Dimer No results for input(s): DDIMER in the last 72 hours. Hemoglobin A1C No results for input(s): HGBA1C in the last 72 hours.  Fasting Lipid Panel No results for input(s): CHOL, HDL, LDLCALC, TRIG, CHOLHDL, LDLDIRECT in the last 72 hours.  Thyroid Function Tests No results for input(s): TSH, T4TOTAL, T3FREE, THYROIDAB in the last 72 hours.  Invalid input(s): FREET3  Other  results:   Imaging    No results found.   Medications:     Scheduled Medications:  [MAR Hold] aspirin  81 mg Oral Daily   [MAR Hold] atorvastatin  80 mg Oral Daily   [MAR Hold] Chlorhexidine Gluconate Cloth  6 each Topical Daily   [MAR Hold] clopidogrel  75 mg Oral Daily   [MAR Hold] enoxaparin (LOVENOX) injection  40 mg Subcutaneous Q24H   [MAR Hold] furosemide  80 mg Intravenous BID   [MAR Hold] insulin aspart  0-15 Units Subcutaneous TID WC   [MAR Hold] insulin aspart  0-5 Units Subcutaneous QHS   [MAR Hold] mouth rinse  15 mL Mouth Rinse BID   [MAR Hold] midodrine  5 mg Oral TID WC   [MAR Hold] potassium chloride  40 mEq Oral Once   [MAR Hold] sodium chloride flush  10-40 mL Intracatheter Q12H   [MAR Hold] sodium chloride flush  3 mL Intravenous Q12H    [MAR Hold] spironolactone  12.5 mg Oral Daily    Infusions:  sodium chloride     [MAR Hold] sodium chloride      PRN Medications: [MAR Hold] sodium chloride, [MAR Hold] acetaminophen, [MAR Hold] ondansetron (ZOFRAN) IV, [MAR Hold] sodium chloride flush, [MAR Hold] sodium chloride flush    Patient Profile   75 y.o. female with history of CAD s/p prior MI in 2004 (details not known), PAD, DM, HTN, HLD. Now admitted with NSTEMI c/b acute systolic HF.  Assessment/Plan   Acute systolic HF: -2/2 NSTEMI -LVEDP 26 mmHg at time of LHC -Echo LVEF 35-40%, RV okay - RHC today shows elevated filling pressures with very prominent v-waves in PCW tracing suggestive of significant MR (likely ischemic) - Repeat echo 11/01/20 EF 30-35% only mild MR - has diuresed well. CVP 5-6. Co-ox 68%  Can change to po torsemide - will plan TEE today to further evaluate for MR - On midrodrine to support BP. BP improved. Decrease midodrine to 2.5 tid - Continue spiro. Hopefully can add further GDMT after midodrine wean  - Supp k.    2. NSTEMI: -Known history of CAD with prior MI in 2004 in [place] (details not known) -Severe diffuse 3 V CAD on LHC this admit.  - Cath films reviewed with Dr. Ali Lowe. PCI of ostial Lcx would be very high-risk with potential plaque shift into LM and LAD. Functional status significantly limited at baseline. Medical therapy only option at this time.  -HS troponin up to 1,249 -ASA + plavix -Continue Atorvastatin 80. LDL 60 - No s/s angina    3. Type II DM: -A1c 7.3 -Consider Farxiga prior to D/C -SSI   4. PAD: -Hx Viabahn covered stent left SFA, stent left TP trunk, PTA left posterior tibial artery 09/20 d/t CLI - ASA + statin   5. Anemia: - Stable  - hgb 9.8 -Iron stores okay -Continue to monitor -No obvious source of bleeding  6. Possible gout -Uric acid 9.3 -Right ankle pain. Has finished prednisone   GOC: Remains tenuous. Concerned about her severe  weakness and frailty. We had discussion with her granddaughter who was at bedside and daughter, Aniceto Boss, on the phone. Have consulted PT/OT. Palliative care consulted as well for help with GOC.    Disposition: HH/PT recommended at discharge CR consult   Length of Stay: 6  Glori Bickers, MD  12/03/2020, 12:33 PM  Advanced Heart Failure Team Pager [phone] (M-F; 7a - 5p)  Please contact [facility] for night-coverage after hours (5p -7a )  and weekends on [email or web address]

## 2020-12-04 DIAGNOSIS — I5021 Acute systolic (congestive) heart failure: Secondary | ICD-10-CM | POA: Diagnosis not present

## 2020-12-04 DIAGNOSIS — I214 Non-ST elevation (NSTEMI) myocardial infarction: Secondary | ICD-10-CM | POA: Diagnosis not present

## 2020-12-04 LAB — CBC
HCT: 29.8 % — ABNORMAL LOW (ref 36.0–46.0)
Hemoglobin: 9.7 g/dL — ABNORMAL LOW (ref 12.0–15.0)
MCH: 28.8 pg (ref 26.0–34.0)
MCHC: 32.6 g/dL (ref 30.0–36.0)
MCV: 88.4 fL (ref 80.0–100.0)
Platelets: 219 10*3/uL (ref 150–400)
RBC: 3.37 MIL/uL — ABNORMAL LOW (ref 3.87–5.11)
RDW: 14.8 % (ref 11.5–15.5)
WBC: 10.3 10*3/uL (ref 4.0–10.5)
nRBC: 0 % (ref 0.0–0.2)

## 2020-12-04 LAB — MAGNESIUM: Magnesium: 2.2 mg/dL (ref 1.7–2.4)

## 2020-12-04 LAB — BASIC METABOLIC PANEL
Anion gap: 11 (ref 5–15)
BUN: 30 mg/dL — ABNORMAL HIGH (ref 8–23)
CO2: 32 mmol/L (ref 22–32)
Calcium: 8.4 mg/dL — ABNORMAL LOW (ref 8.9–10.3)
Chloride: 91 mmol/L — ABNORMAL LOW (ref 98–111)
Creatinine, Ser: 1.22 mg/dL — ABNORMAL HIGH (ref 0.44–1.00)
GFR, Estimated: 46 mL/min — ABNORMAL LOW (ref >60)
Glucose, Bld: 108 mg/dL — ABNORMAL HIGH (ref 70–99)
Potassium: 3.3 mmol/L — ABNORMAL LOW (ref 3.5–5.1)
Sodium: 134 mmol/L — ABNORMAL LOW (ref 135–145)

## 2020-12-04 LAB — COOXEMETRY PANEL
Carboxyhemoglobin: 0.7 % (ref 0.5–1.5)
Methemoglobin: 0.7 % (ref 0.0–1.5)
O2 Saturation: 66.1 %
Total hemoglobin: 18.1 g/dL — ABNORMAL HIGH (ref 12.0–16.0)

## 2020-12-04 LAB — GLUCOSE, CAPILLARY
Glucose-Capillary: 123 mg/dL — ABNORMAL HIGH (ref 70–99)
Glucose-Capillary: 216 mg/dL — ABNORMAL HIGH (ref 70–99)
Glucose-Capillary: 204 mg/dL — ABNORMAL HIGH (ref 70–99)
Glucose-Capillary: 211 mg/dL — ABNORMAL HIGH (ref 70–99)

## 2020-12-04 MED ORDER — POTASSIUM CHLORIDE CRYS ER 20 MEQ PO TBCR
40.0000 meq | EXTENDED_RELEASE_TABLET | ORAL | Status: AC
  Administered 2020-12-04 (×2): 40 meq via ORAL
  Filled 2020-12-04 (×2): qty 2

## 2020-12-04 MED ORDER — TORSEMIDE 20 MG PO TABS
40.0000 mg | ORAL_TABLET | Freq: Every day | ORAL | Status: DC
  Administered 2020-12-05: 40 mg via ORAL
  Filled 2020-12-04: qty 2

## 2020-12-04 MED ORDER — SORBITOL 70 % SOLN
30.0000 mL | Freq: Once | Status: AC
  Administered 2020-12-04: 30 mL via ORAL
  Filled 2020-12-04: qty 30

## 2020-12-04 NOTE — Plan of Care (Signed)

## 2020-12-04 NOTE — Progress Notes (Signed)
Advanced Heart Failure Rounding Note  PCP-Cardiologist: None   Subjective:    TEE 11/4: EF 35-40% 2-3+ MR (moderate)  Continues to diurese with IV lasix. Weight down 28 pounds total.   CVP 4 Co-ox 66% K 3.3%  Remains weak. Says breathing is a bit better. No CP, orthopnea or PND    Objective:   Weight Range: 90.3 kg Body mass index is 31.18 kg/m.   Vital Signs:   Temp:  [97.8 F (36.6 C)-98.8 F (37.1 C)] 97.8 F (36.6 C) (11/05 0751) Pulse Rate:  [78-94] 78 (11/05 0751) Resp:  [17-24] 17 (11/05 0751) BP: (117-136)/(48-77) 131/77 (11/05 0751) SpO2:  [92 %-99 %] 99 % (11/05 0751) Weight:  [90.3 kg] 90.3 kg (11/05 0650) Last BM Date:  (PTA per patient)  Weight change: Filed Weights   12/02/20 0500 12/03/20 0614 12/04/20 0650  Weight: 96 kg 91.7 kg 90.3 kg    Intake/Output:   Intake/Output Summary (Last 24 hours) at 12/04/2020 1052 Last data filed at 12/04/2020 0959 Gross per 24 hour  Intake 214 ml  Output 1150 ml  Net -936 ml       Physical Exam   General:  Sitting in chair No resp difficulty HEENT: normal Neck: supple. no JVD. Carotids 2+ bilat; no bruits. No lymphadenopathy or thryomegaly appreciated. Cor: PMI nondisplaced. Regular rate & rhythm. No rubs, gallops or murmurs. Lungs: clear Abdomen: obese soft, nontender, nondistended. No hepatosplenomegaly. No bruits or masses. Good bowel sounds. Extremities: no cyanosis, clubbing, rash, edema Neuro: alert & orientedx3, cranial nerves grossly intact. moves all 4 extremities w/o difficulty. Affect pleasant   Telemetry   NSR 70-80s Personally reviewed  Labs    CBC Recent Labs    12/03/20 0130 12/04/20 0500  WBC 14.3* 10.3  HGB 9.8* 9.7*  HCT 29.4* 29.8*  MCV 87.5 88.4  PLT 215 219    Basic Metabolic Panel Recent Labs    69/62/95 0130 12/04/20 0500  NA 131* 134*  K 3.8 3.3*  CL 89* 91*  CO2 33* 32  GLUCOSE 200* 108*  BUN 29* 30*  CREATININE 1.25* 1.22*  CALCIUM 9.0 8.4*  MG  2.4 2.2    Liver Function Tests No results for input(s): AST, ALT, ALKPHOS, BILITOT, PROT, ALBUMIN in the last 72 hours.  No results for input(s): LIPASE, AMYLASE in the last 72 hours. Cardiac Enzymes No results for input(s): CKTOTAL, CKMB, CKMBINDEX, TROPONINI in the last 72 hours.  BNP: BNP (last 3 results) No results for input(s): BNP in the last 8760 hours.  ProBNP (last 3 results) No results for input(s): PROBNP in the last 8760 hours.   D-Dimer No results for input(s): DDIMER in the last 72 hours. Hemoglobin A1C No results for input(s): HGBA1C in the last 72 hours.  Fasting Lipid Panel No results for input(s): CHOL, HDL, LDLCALC, TRIG, CHOLHDL, LDLDIRECT in the last 72 hours.  Thyroid Function Tests No results for input(s): TSH, T4TOTAL, T3FREE, THYROIDAB in the last 72 hours.  Invalid input(s): FREET3  Other results:   Imaging    No results found.   Medications:     Scheduled Medications:  aspirin  81 mg Oral Daily   atorvastatin  80 mg Oral Daily   Chlorhexidine Gluconate Cloth  6 each Topical Daily   clopidogrel  75 mg Oral Daily   enoxaparin (LOVENOX) injection  40 mg Subcutaneous Q24H   furosemide  80 mg Intravenous BID   insulin aspart  0-15 Units Subcutaneous TID WC  insulin aspart  0-5 Units Subcutaneous QHS   mouth rinse  15 mL Mouth Rinse BID   midodrine  5 mg Oral TID WC   sodium chloride flush  10-40 mL Intracatheter Q12H   sodium chloride flush  3 mL Intravenous Q12H   spironolactone  12.5 mg Oral Daily    Infusions:  sodium chloride      PRN Medications: sodium chloride, acetaminophen, ondansetron (ZOFRAN) IV, sodium chloride flush, sodium chloride flush    Patient Profile   75 y.o. female with history of CAD s/p prior MI in 2004 (details not known), PAD, DM, HTN, HLD. Now admitted with NSTEMI c/b acute systolic HF.  Assessment/Plan   Acute systolic HF: -2/2 NSTEMI -LVEDP 26 mmHg at time of LHC -Echo LVEF 35-40%, RV  okay - RHC today shows elevated filling pressures with very prominent v-waves in PCW tracing suggestive of significant MR (likely ischemic) - Repeat echo 12/02/20 EF 30-35% only mild MR - TEE 12/03/20 EF 35-40% moderate MR - has diuresed well. CVP 4. Co-ox 66%   - change IV lasix to po torsemideR - On midrodrine to support BP. BP improved. Stop midodrine - Continue spiro. Hopefully can add further GDMT tomorrow if BP stable off midodrine - Supp k.    2. NSTEMI: -Known history of CAD with prior MI in 2004 in [place] (details not known) -Severe diffuse 3 V CAD on LHC this admit.  - Cath films reviewed with Dr. Ali Lowe. PCI of ostial Lcx would be very high-risk with potential plaque shift into LM and LAD. Functional status significantly limited at baseline. Medical therapy only option at this time.  -HS troponin up to 1,249 -ASA + plavix -Continue Atorvastatin 80. LDL 60 - No s/s angina    3. Type II DM: -A1c 7.3 -Consider Farxiga prior to D/C -SSI   4. PAD: -Hx Viabahn covered stent left SFA, stent left TP trunk, PTA left posterior tibial artery 09/20 d/t CLI - ASA + statin   5. Anemia: - Stable  - hgb 9.7 -Iron stores okay -Continue to monitor -No obvious source of bleeding  6. Possible gout -Uric acid 9.3 -Right ankle pain. Has finished prednisone   7. Physical deconditioning - spoke with nursing staff. She is very weak.  - PT recommending HHPT but family very concerned that she is too weak to go home.  - Have d/w PT this am to re-assess   Length of Stay: Steele, MD  12/04/2020, 10:52 AM  Advanced Heart Failure Team Pager [phone] (M-F; 7a - 5p)  Please contact [facility] for night-coverage after hours (5p -7a ) and weekends on [email or web address]

## 2020-12-04 NOTE — Progress Notes (Signed)
Physical Therapy Treatment Patient Details Name: Brittney [name] MRN: [id number] DOB: May 22, 1945 Today's Date: 12/04/2020   History of Present Illness 75 y.o. female admitted 10/29 with SOB and chest tightness. Pt with multi-vessel CAD with NSTEMI. Not a surgical candidate. Right heart cath 11/3. PMHx: CAD, DM II, DTV, HLD, HTN, and CVA.    PT Comments    Pt in chair on arrival stating fatigue and lack of sleep. Pt on 2L with sats 98% with pt having to take a break between each syllable when speaking due to fatigue and SOB. Pt with urinary incontinence and unaware on arrival stating she wears depends at home but can usually manage when soiled to change them. Pt has not progressed in the manner anticipated acutely with max gait distance of 35' and has stairs to enter home as well as currently relying on assist for all transfers. Pt agreeable that she is not at a functional level to return home yet and updated D/C plan to ST[facility] with pt in agreement. Will continue to follow and encouraged progressive mobility and gait with therapy, mobility tech and nursing.   HR 87 SpO2 98% on 2L   Recommendations for follow up therapy are one component of a multi-disciplinary discharge planning process, led by the attending physician.  Recommendations may be updated based on patient status, additional functional criteria and insurance authorization.  Follow Up Recommendations  Skilled nursing-short term rehab (<3 hours/day)     Assistance Recommended at Discharge Frequent or constant Supervision/Assistance  Equipment Recommendations  Wheelchair (measurements PT)    Recommendations for Other Services       Precautions / Restrictions Precautions Precautions: Fall Precaution Comments: watch O2, RR, incontinent     Mobility  Bed Mobility               General bed mobility comments: pt in recliner before and after session    Transfers Overall transfer level: Needs assistance   Transfers:  Sit to/from Stand;Stand Pivot Transfers Sit to Stand: Min assist Stand pivot transfers: Min assist         General transfer comment: min assist with cues for hand placement to rise from surface as well as control descent as pt "plops". Pt pivoted with RW from recliner<>BSC due to incontinence and need for linen change and pericare. Pt able to direct RW but requires assist for balance    Ambulation/Gait             General Gait Details: Pt denied attempting due to fatigue   Stairs             Wheelchair Mobility    Modified Rankin (Stroke Patients Only)       Balance Overall balance assessment: Needs assistance   Sitting balance-Leahy Scale: Fair Sitting balance - Comments: EOb without physical assist   Standing balance support: Bilateral upper extremity supported;During functional activity Standing balance-Leahy Scale: Poor Standing balance comment: reliant on RW for standing                            Cognition Arousal/Alertness: Awake/alert Behavior During Therapy: Flat affect Overall Cognitive Status: Impaired/Different from baseline Area of Impairment: Safety/judgement;Problem solving                         Safety/Judgement: Decreased awareness of deficits   Problem Solving: Slow processing General Comments: pt oriented but unaware of being soaked in urine on arrival  Exercises General Exercises - Lower Extremity Long Arc Quad: AROM;Both;15 reps Hip Flexion/Marching: AROM;Both;15 reps    General Comments        Pertinent Vitals/Pain Pain Assessment: No/denies pain    Home Living                          Prior Function            PT Goals (current goals can now be found in the care plan section) Progress towards PT goals: Not progressing toward goals - comment (pt with limited activity tolerance and unable to progress gait)    Frequency    Min 3X/week      PT Plan Discharge plan needs  to be updated    Co-evaluation              AM-PAC PT "6 Clicks" Mobility   Outcome Measure  Help needed turning from your back to your side while in a flat bed without using bedrails?: A Little Help needed moving from lying on your back to sitting on the side of a flat bed without using bedrails?: A Little Help needed moving to and from a bed to a chair (including a wheelchair)?: A Little Help needed standing up from a chair using your arms (e.g., wheelchair or bedside chair)?: A Little Help needed to walk in hospital room?: Total Help needed climbing 3-5 steps with a railing? : Total 6 Click Score: 14    End of Session Equipment Utilized During Treatment: Oxygen Activity Tolerance: Patient limited by fatigue Patient left: in chair;with call bell/phone within reach;with chair alarm set Nurse Communication: Mobility status PT Visit Diagnosis: Other abnormalities of gait and mobility (R26.89);Muscle weakness (generalized) (M62.81);Difficulty in walking, not elsewhere classified (R26.2)     Time: 4497-5300 PT Time Calculation (min) (ACUTE ONLY): 19 min  Charges:  $Therapeutic Activity: 8-22 mins                     Dennice Tindol P, PT Acute Rehabilitation Services Pager: [phone] Office: [phone]    Myley Bahner B Kennth Vanbenschoten 12/04/2020, 11:46 AM

## 2020-12-04 NOTE — Progress Notes (Signed)
   12/04/20 0856  Clinical Encounter Type  Visited With Patient;Health care provider  Visit Type Initial  Referral From Nurse   Chaplain received a request from the nurse to help support the patient with goals of care conversations, particularly with some tensions in the family for how to best meet the patient's needs. Chaplain met with patient briefly, as she was both sleepy and hungry. I met with the nurse afterwards, and encouraged follow-up with palliative care for goals of care. And, shared that I'm available to be a supportive presence for the patient in any of those difficult conversations. Chaplain will seek to follow-up. Spiritual care services available as needed.   Jeri Lager, Chaplain

## 2020-12-04 NOTE — Progress Notes (Signed)
Pt sitting up in chair, Pt is tearful. Pt endorses emotional strain regarding decisions to be made after discharge ( I.e. need for additional help, possible relocation to [place], etc). Pt endorses emotional strain from her children's estrangements to each other. She also endorses not wanting "to be a burden" on her family. Pt states that her declining health, need for assistive devices, need for additional help, and changes in familia roles are all new to her. RN sympathized that role changes from being the caregiver to the one receiving the care is a hard transition and that her safety and level of comfort in this transition is important to her care team.   Pt educated on help that case management can coordinate either here or in [place]. Also educated on in house Chaplain with regards to family conference and potentially getting everyone on the same page with pt's goals of care.   Page sent to Chaplain's number.

## 2020-12-04 NOTE — Progress Notes (Signed)
Palliative Medicine Inpatient Follow Up Note  Consulting Provider: Joette Catching, PA-C   Reason for consult:   Brittney [name]  Reason for Consult? GOC discussion    HPI:  Per intake H&P --> 75 y.o. female with history of CAD s/p prior MI in 2004 (details not known), PAD, DM, HTN, HLD. Now admitted with NSTEMI c/b acute systolic HF.   Palliative care has been requested to get involved to further discuss goals of care.  Today's Discussion (12/04/2020):  *Please note that this is a verbal dictation therefore any spelling or grammatical errors are due to the "[place] One" system interpretation.  I met at bedside this afternoon with Brittney [name] and her daughter, Brittney [name].We reviewed the role of palliative care and the reason for our involvement in Brittney [name]'s care.  We discussed heart failure and it's progression/classification based upon NYHA. We discussed that Brittney [name] being less able to complete bADLs could very well indicate progression of disease. Brittney [name] shared that she had no idea her mother was in this present situation. She expresses that her sister, niece, and nephew have shown her that they are unable to care for Brittney [name] in the way she needs to be cared for. We reviewed that Brittney [name] feels Brittney [name]'s situation is very similar to her fathers situation prior to his departure from this earth. She is very emotional during our conversation and shares great disbelief at the amount of weight her mother has lost and how ill appearing she is as compared to Brittney [name] when she last saw her.  Discussed the importance of advance care planning document.Brittney [name] was able to vocalize wanting her daughter, Brittney [name] and her son, Brittney [name]. To be her HCPOAs. We completed these documents and plan for notarization on Monday.  We discussed code status and reviewed a MOST form. Brittney [name] was able to complete this with wishes as below:  Cardiopulmonary Resuscitation:  Do Not Attempt Resuscitation (DNR/No CPR)  Medical Interventions: Limited Additional Interventions: Use medical treatment, IV fluids and cardiac monitoring as indicated, DO NOT USE intubation or mechanical ventilation. May consider use of less invasive airway support such as BiPAP or CPAP. Also provide comfort measures. Transfer to the hospital if indicated. Avoid intensive care.   Antibiotics: Determine use of limitation of antibiotics when infection occurs  IV Fluids: IV fluids if indicated  Feeding Tube: Feeding tube long-term if indicated   We discussed the concerns moving forward for Brittney [name]'s care. Brittney [name] requests to know more about long term living options which I shared I would alert our MSW to. Reviewed the plan for SNF after acute hospitalization.   Discussed the importance of continued conversation with family and their  medical providers regarding overall plan of care and treatment options, ensuring decisions are within the context of the patients values and GOCs.  Questions and concerns addressed   Reviewed that we will continue to be involved moving forward.   Objective Assessment: Vital Signs Vitals:   12/04/20 0751 12/04/20 1140  BP: 131/77   Pulse: 78 87  Resp: 17   Temp: 97.8 F (36.6 C)   SpO2: 99% 98%    Intake/Output Summary (Last 24 hours) at 12/04/2020 1603 Last data filed at 12/04/2020 0959 Gross per 24 hour  Intake 8 ml  Output 1150 ml  Net -1142 ml   Last Weight  Most recent update: 12/04/2020  6:51 AM    Weight  90.3 kg (199 lb 1.2 oz)  Gen:  [name] in NAD HEENT: moist mucous membranes CV: Regular rate and rhythm  PULM: On  2LPM Brittney [name] ABD: soft/nontender  EXT: (+) BLE edema Neuro: Alert and oriented x3  SUMMARY OF RECOMMENDATIONS   DNAR/DNI   MOST Completed, paper copy placed onto the chart electric copy can be found in [facility]  DNR Form Completed, paper copy placed onto the chart electric copy can be found in [employer]  documents completed - Will need to be notarized on Monday  Plan for SNF placement on discharge  TOC - OP Palliative support as discussed with patient and her daughter, Brittney [name]   Ongoing support until discharged   Time Spent: 60 Greater than 50% of the time was spent in counseling and coordination of care ______________________________________________________________________________________ [place] Team Team Cell Phone: [phone] Please utilize secure chat with additional questions, if there is no response within 30 minutes please call the above phone number  Palliative Medicine Team providers are available by phone from 7am to 7pm daily and can be reached through the team cell phone.  Should this patient require assistance outside of these hours, please call the patient's attending physician.

## 2020-12-05 ENCOUNTER — Encounter (HOSPITAL_COMMUNITY): Payer: Self-pay | Admitting: Internal Medicine

## 2020-12-05 DIAGNOSIS — Z7189 Other specified counseling: Secondary | ICD-10-CM

## 2020-12-05 DIAGNOSIS — I214 Non-ST elevation (NSTEMI) myocardial infarction: Secondary | ICD-10-CM | POA: Diagnosis not present

## 2020-12-05 DIAGNOSIS — Z515 Encounter for palliative care: Secondary | ICD-10-CM | POA: Diagnosis not present

## 2020-12-05 DIAGNOSIS — I5021 Acute systolic (congestive) heart failure: Secondary | ICD-10-CM | POA: Diagnosis not present

## 2020-12-05 LAB — GLUCOSE, CAPILLARY
Glucose-Capillary: 121 mg/dL — ABNORMAL HIGH (ref 70–99)
Glucose-Capillary: 150 mg/dL — ABNORMAL HIGH (ref 70–99)
Glucose-Capillary: 239 mg/dL — ABNORMAL HIGH (ref 70–99)
Glucose-Capillary: 158 mg/dL — ABNORMAL HIGH (ref 70–99)

## 2020-12-05 LAB — MAGNESIUM: Magnesium: 2.3 mg/dL (ref 1.7–2.4)

## 2020-12-05 LAB — CBC
HCT: 30.4 % — ABNORMAL LOW (ref 36.0–46.0)
Hemoglobin: 9.8 g/dL — ABNORMAL LOW (ref 12.0–15.0)
MCH: 28.8 pg (ref 26.0–34.0)
MCHC: 32.2 g/dL (ref 30.0–36.0)
MCV: 89.4 fL (ref 80.0–100.0)
Platelets: 215 10*3/uL (ref 150–400)
RBC: 3.4 MIL/uL — ABNORMAL LOW (ref 3.87–5.11)
RDW: 14.6 % (ref 11.5–15.5)
WBC: 9.5 10*3/uL (ref 4.0–10.5)
nRBC: 0 % (ref 0.0–0.2)

## 2020-12-05 LAB — BASIC METABOLIC PANEL
Anion gap: 9 (ref 5–15)
BUN: 29 mg/dL — ABNORMAL HIGH (ref 8–23)
CO2: 34 mmol/L — ABNORMAL HIGH (ref 22–32)
Calcium: 8.8 mg/dL — ABNORMAL LOW (ref 8.9–10.3)
Chloride: 90 mmol/L — ABNORMAL LOW (ref 98–111)
Creatinine, Ser: 1.15 mg/dL — ABNORMAL HIGH (ref 0.44–1.00)
GFR, Estimated: 50 mL/min — ABNORMAL LOW (ref >60)
Glucose, Bld: 108 mg/dL — ABNORMAL HIGH (ref 70–99)
Potassium: 3.7 mmol/L (ref 3.5–5.1)
Sodium: 133 mmol/L — ABNORMAL LOW (ref 135–145)

## 2020-12-05 LAB — COOXEMETRY PANEL
Carboxyhemoglobin: 1 % (ref 0.5–1.5)
Methemoglobin: 0.7 % (ref 0.0–1.5)
O2 Saturation: 63.1 %
Total hemoglobin: 12.5 g/dL (ref 12.0–16.0)

## 2020-12-05 MED ORDER — GLYCERIN (LAXATIVE) 1 G RE SUPP
2.0000 | Freq: Once | RECTAL | Status: DC
  Filled 2020-12-05: qty 2

## 2020-12-05 MED ORDER — GLYCERIN (LAXATIVE) 2 G RE SUPP
1.0000 | Freq: Once | RECTAL | Status: DC
  Filled 2020-12-05: qty 1

## 2020-12-05 MED ORDER — SENNOSIDES-DOCUSATE SODIUM 8.6-50 MG PO TABS
1.0000 | ORAL_TABLET | Freq: Every day | ORAL | Status: DC
  Administered 2020-12-05 – 2020-12-08 (×4): 1 via ORAL
  Filled 2020-12-05 (×5): qty 1

## 2020-12-05 MED ORDER — SORBITOL 70 % SOLN: 30.0000 mL | Freq: Once | Status: DC

## 2020-12-05 NOTE — NC FL2 (Signed)
[place] MEDICAID FL2 LEVEL OF CARE SCREENING TOOL     IDENTIFICATION  Patient Name: Brittney [name] Birthdate: 02-Nov-1945 Sex: female Admission Date (Current Location): 11/27/2020  [facility] and [place] Number:  Producer, television/film/video and Address:  [facility], 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) 10/11/2018   Carpal tunnel syndrome 10/11/2018   Cobalamin deficiency 10/11/2018   Leukocytosis 10/11/2018   Current use of insulin (HCC) 10/11/2018   Memory loss 10/11/2018   Aortic atherosclerosis (HCC) 09/03/2018   Diabetic ulcer of toe of left foot associated with type 2 diabetes mellitus, with fat layer exposed (HCC) 08/26/2018   Bilateral tinnitus 01/03/2016   Carotid artery occlusion without infarction 01/03/2016   Diabetic neuropathy (HCC) 01/03/2016   Diabetic retinopathy (HCC) 01/03/2016   Essential hypertension 01/03/2016   History of MI (myocardial infarction) 01/03/2016   Bruit of right carotid artery 10/27/2015   Solitary pulmonary nodule 10/27/2015   Hyperlipidemia, unspecified 01/11/2015   Allergic reaction 02/27/2013    Orientation RESPIRATION BLADDER Height & Weight     Self, Time, Situation, Place  O2 (2L) Incontinent, External catheter Weight: 199 lb 4.7 oz (90.4 kg) Height:  5\' 7"  (170.2 cm)  BEHAVIORAL SYMPTOMS/MOOD NEUROLOGICAL BOWEL NUTRITION STATUS      Continent Diet (See DC summary)   AMBULATORY STATUS COMMUNICATION OF NEEDS Skin   Limited Assist Verbally Other (Comment) (Venous statis ulcer anterior right lower)                       Personal Care Assistance Level of Assistance  Bathing, Feeding, Dressing Bathing Assistance: Maximum assistance Feeding assistance: Independent Dressing Assistance: Limited assistance     Functional Limitations Info  Sight, Hearing, Speech Sight Info: Impaired Hearing Info: Adequate Speech Info: Adequate    SPECIAL CARE FACTORS FREQUENCY  PT (By licensed PT), OT (By licensed OT)     PT Frequency: 5X per week OT Frequency: 5X per week            Contractures Contractures Info: Not present    Additional Factors Info  Code Status, Allergies, Insulin Sliding Scale Code Status Info: DNR Allergies Info: Iodinated Diagnostic Agents, Shellfish Allergy   Insulin Sliding Scale Info: See Discharge Summary       Current Medications (12/05/2020):  This is the current hospital active medication list Current Facility-Administered Medications  Medication Dose Route Frequency Provider Last Rate Last Admin   acetaminophen (TYLENOL) tablet 650 mg  650 mg Oral Q4H PRN 13/06/2020, PA-C   650 mg at 12/05/20 1037   aspirin chewable tablet 81 mg  81 mg Oral Daily 13/06/22, Andrey Farmer   81 mg at 12/05/20 1119   atorvastatin (LIPITOR) tablet 80 mg  80 mg Oral Daily 13/06/22, Andrey Farmer   80 mg at 12/05/20 1118   Chlorhexidine Gluconate Cloth 2 % PADS 6 each  6 each Topical Daily 13/06/22,  PA-C   6 each at 12/05/20 1124   clopidogrel (PLAVIX) tablet 75 mg  75 mg Oral Daily Andrey Farmer, [place]   75 mg at 12/05/20 1119   enoxaparin (LOVENOX) injection 40 mg  40 mg Subcutaneous Q24H Earnie Larsson, RPH   40 mg at 12/04/20 2154   Glycerin (Adult) 2 g suppository 1 suppository  1 suppository Rectal Once Bensimhon, Bevelyn Buckles, MD       insulin aspart (novoLOG) injection 0-15 Units  0-15 Units  Subcutaneous TID WC Andrey Farmer, [place]   2 Units at 12/05/20 6389   insulin aspart (novoLOG) injection 0-5 Units  0-5 Units Subcutaneous QHS Andrey Farmer, [place]   2 Units at 12/04/20 2154   MEDLINE mouth rinse  15 mL Mouth Rinse BID Andrey Farmer, [place]   15 mL at 12/05/20 1124   ondansetron (ZOFRAN) injection 4 mg  4 mg Intravenous Q6H PRN Andrey Farmer, PA-C       senna-docusate (Senokot-S) tablet 1 tablet  1 tablet Oral QHS Bensimhon, Bevelyn Buckles, MD   1 tablet at 12/05/20 1349   sodium chloride flush (NS) 0.9 % injection 10-40 mL  10-40 mL Intracatheter Q12H Andrey Farmer, PA-C   10 mL at 12/05/20 1124   sorbitol 70 % solution 30 mL  30 mL Oral Once Bensimhon, Bevelyn Buckles, MD       spironolactone (ALDACTONE) tablet 12.5 mg  12.5 mg Oral Daily Bensimhon, Bevelyn Buckles, MD   12.5 mg at 12/05/20 1118   torsemide (DEMADEX) tablet 40 mg  40 mg Oral Daily Bensimhon, Bevelyn Buckles, MD   40 mg at 12/05/20 1118     Discharge Medications: Please see discharge summary for a list of discharge medications.  Relevant Imaging Results:  Relevant Lab Results:   Additional Information SSN# [id number] pt has been vaccinated and at least one booster  Patrice Paradise, LCSW

## 2020-12-05 NOTE — TOC Initial Note (Signed)
Transition of Care [facility]) - Initial/Assessment Note    Patient Details  Name: Brittney [name] MRN: [id number] Date of Birth: 10/25/1945  [facility]) CM/SW Contact:    Bary Castilla, LCSW Phone Number:[phone] 12/05/2020, 1:32 PM  Clinical Narrative:                  CSW noted that pt's disposition plan changed to SNF.  CSW met with patient to discuss PT and pt's granddaughter Candiss Norse to discuss recommendation of a SNF. Patient was aware of recommendation and in agreement with going to a ST SNF. CSW discussed the SNF process.CSW provided patient with medicare.gov rating list.  Patient gave CSW permission to fax referrals out to local facilities.CSW answered questions about the SNF process and the next steps in the process.  Pt stated that she has been vaccinated and has had at least one booster. Pt would like to be placed in [facility] if possible.   CSW received permission to follow up with pt's daughter Delana Meyer.  TOC team will continue to assist with discharge planning needs.     Expected Discharge Plan: Skilled Nursing Facility Barriers to Discharge: Insurance Authorization, SNF Pending bed offer, Continued Medical Work up   Patient Goals and CMS Choice Patient states their goals for this hospitalization and ongoing recovery are:: would like to go home after dc CMS Medicare.gov Compare Post Acute Care list provided to:: Patient Choice offered to / list presented to : Patient  Expected Discharge Plan and Services Expected Discharge Plan: [place] In-house Referral: Clinical Social Work Discharge Planning Services: CM Consult Post Acute Care Choice: [place] arrangements for the past 2 months: [place]                                      Prior Living Arrangements/Services Living arrangements for the past 2 months: Single Family Home Lives with:: Adult Children Patient language and need for interpreter reviewed::  Yes        Need for Family Participation in Patient Care: Yes (Comment) Care giver support system in place?: Yes (comment)   Criminal Activity/Legal Involvement Pertinent to Current Situation/Hospitalization: No - Comment as needed  Activities of Daily Living      Permission Sought/Granted Permission sought to share information with : Case Manager, Family Supports, PCP Permission granted to share information with : Yes, Verbal Permission Granted  Share Information with NAME: Hadley Soileau  Permission granted to share info w AGENCY: SNFs  Permission granted to share info w Relationship: granddaughter  Permission granted to share info w Contact Information: [phone]  Emotional Assessment Appearance:: Appears stated age Attitude/Demeanor/Rapport: Engaged Affect (typically observed): Accepting Orientation: : Oriented to Self, Oriented to Place, Oriented to  Time, Oriented to Situation   Psych Involvement: No (comment)  Admission diagnosis:  ST elevation myocardial infarction (STEMI), unspecified artery (HCC) [I21.3] NSTEMI (non-ST elevated myocardial infarction) [facility]) [I21.4] Patient Active Problem List   Diagnosis Date Noted   NSTEMI (non-ST elevated myocardial infarction) ([place]) 11/27/2020   Atherosclerosis of native artery of extremity ([place]) 10/11/2018   Carpal tunnel syndrome 10/11/2018   Cobalamin deficiency 10/11/2018   Leukocytosis 10/11/2018   Current use of insulin ([place]) 10/11/2018   Memory loss 10/11/2018   Aortic atherosclerosis ([place]) 09/03/2018   Diabetic ulcer of toe of left foot associated with type 2 diabetes mellitus, with fat layer exposed (  [place]) 08/26/2018   Bilateral tinnitus 01/03/2016   Carotid artery occlusion without infarction 01/03/2016   Diabetic neuropathy ([place]) 01/03/2016   Diabetic retinopathy ([place]) 01/03/2016   Essential hypertension 01/03/2016   History of MI (myocardial infarction) 01/03/2016   Bruit of right carotid artery 10/27/2015    Solitary pulmonary nodule 10/27/2015   Hyperlipidemia, unspecified 01/11/2015   Allergic reaction 02/27/2013   PCP:  Kristopher Glee., MD Pharmacy:   [phone] [facility] [place], [address] AT [address] [place] [address] Phone: [phone] Fax: [phone]  [facility] [address] [place] [address] Phone: [phone] Fax: [phone]     Social Determinants of Health (SDOH) Interventions    Readmission Risk Interventions No flowsheet data found.

## 2020-12-05 NOTE — Plan of Care (Signed)
Discussed with patient in front of family plan of care for the evening, pain management, importance of movement with some teach back displayed  Problem: Education: Goal: Knowledge of General Education information will improve Description: Including pain rating scale, medication(s)/side effects and non-pharmacologic comfort measures Outcome: Progressing   Problem: Health Behavior/Discharge Planning: Goal: Ability to manage health-related needs will improve Outcome: Progressing

## 2020-12-05 NOTE — Plan of Care (Signed)
  Problem: Education: Goal: Knowledge of General Education information will improve Description: Including pain rating scale, medication(s)/side effects and non-pharmacologic comfort measures Outcome: Progressing   Problem: Health Behavior/Discharge Planning: Goal: Ability to manage health-related needs will improve Outcome: Progressing   Problem: Clinical Measurements: Goal: Ability to maintain clinical measurements within normal limits will improve Outcome: Progressing Goal: Will remain free from infection Outcome: Progressing Goal: Respiratory complications will improve Outcome: Progressing   Problem: Nutrition: Goal: Adequate nutrition will be maintained Outcome: Progressing   Problem: Coping: Goal: Level of anxiety will decrease Outcome: Progressing   

## 2020-12-05 NOTE — Progress Notes (Addendum)
   Palliative Medicine Inpatient Follow Up Note  Consulting Provider: Joette Catching, PA-C   Reason for consult:   [place]  Reason for Consult? GOC discussion    HPI:  Per intake H&P --> 75 y.o. female with history of CAD s/p prior MI in 2004 (details not known), PAD, DM, HTN, HLD. Now admitted with NSTEMI c/b acute systolic HF.   Palliative care has been requested to get involved to further discuss goals of care.  Today's Discussion (12/05/2020):  *Please note that this is a verbal dictation therefore any spelling or grammatical errors are due to the "[place] One" system interpretation.  I met at bedside this morning with Gregary Signs. Her granddaughter was present at bedside. She shares that Yovana had a good night sleep and did not have any bouts of anxiety or shortness or breath.She appeared to be resting this morning though able to awaken and oriented when aroused.  Reviewed that we will continue to be involved moving forward. Plan will be for short term skilled nursing placement.   Palliative support provided questions/concerns answered.  Objective Assessment: Vital Signs Vitals:   12/05/20 0335 12/05/20 0821  BP: 129/68 130/70  Pulse: 80 83  Resp: 19 15  Temp: 98.2 F (36.8 C) 98.3 F (36.8 C)  SpO2: 99% 100%    Intake/Output Summary (Last 24 hours) at 12/05/2020 1062 Last data filed at 12/05/2020 6948 Gross per 24 hour  Intake 8 ml  Output 850 ml  Net -842 ml    Last Weight  Most recent update: 12/05/2020  6:18 AM    Weight  90.4 kg (199 lb 4.7 oz)            Gen:  [name] in NAD HEENT: moist mucous membranes CV: Regular rate and rhythm  PULM: On  2LPM [place] ABD: soft/nontender  EXT: (+) BLE edema Neuro: Alert and oriented x3  SUMMARY OF RECOMMENDATIONS   DNAR/DNI   MOST Completed, paper copy placed onto the chart electric copy can be found in [place]  DNR Form Completed, paper copy  placed onto the chart electric copy can be found in [employer] documents completed - Will need to be notarized tomorrow  Plan for SNF placement on discharge (family prefers a SNF in [place])  TOC - OP Palliative support as discussed with patient and her daughter, Aniceto Boss   Ongoing support until discharged   Time Spent: 15 Greater than 50% of the time was spent in counseling and coordination of care ______________________________________________________________________________________ [place] Team Team Cell Phone: [phone] Please utilize secure chat with additional questions, if there is no response within 30 minutes please call the above phone number  Palliative Medicine Team providers are available by phone from 7am to 7pm daily and can be reached through the team cell phone.  Should this patient require assistance outside of these hours, please call the patient's attending physician.

## 2020-12-05 NOTE — Progress Notes (Addendum)
Advanced Heart Failure Rounding Note  PCP-Cardiologist: None   Subjective:    TEE 11/4: EF 35-40% 2-3+ MR (moderate)  IV lasix switched to po torsemide today. Off midodrine.  Feels okay. Still short of breath with little activity.  Co-ox 63% CVP 4  Objective:   Weight Range: 90.4 kg Body mass index is 31.21 kg/m.   Vital Signs:   Temp:  [97.5 F (36.4 C)-98.3 F (36.8 C)] 98.3 F (36.8 C) (11/06 0821) Pulse Rate:  [72-87] 83 (11/06 0821) Resp:  [15-25] 15 (11/06 0821) BP: (129-156)/(68-90) 130/70 (11/06 0821) SpO2:  [97 %-100 %] 100 % (11/06 0821) Weight:  [90.4 kg] 90.4 kg (11/06 0617) Last BM Date:  (PTA)  Weight change: Filed Weights   12/03/20 0614 12/04/20 0650 12/05/20 0617  Weight: 91.7 kg 90.3 kg 90.4 kg    Intake/Output:   Intake/Output Summary (Last 24 hours) at 12/05/2020 1003 Last data filed at 12/05/2020 0900 Gross per 24 hour  Intake 240 ml  Output 850 ml  Net -610 ml       Physical Exam  CVP 4 General:  Fatigued appearing. No distress. HEENT: normal Neck: supple. no JVD. Carotids 2+ bilat; no bruits. No lymphadenopathy or thryomegaly appreciated. Cor: PMI nondisplaced. Regular rate & rhythm. No rubs, gallops, + MR murmur Lungs: clear Abdomen: soft, nontender, nondistended. No hepatosplenomegaly. No bruits or masses. Good bowel sounds. Extremities: no cyanosis, clubbing, rash, edema, TED hose on Neuro: alert & orientedx3, cranial nerves grossly intact. moves all 4 extremities w/o difficulty. Affect pleasant   Telemetry   NSR 70s (personally reviewed)  Labs    CBC Recent Labs    12/04/20 0500 12/05/20 0518  WBC 10.3 9.5  HGB 9.7* 9.8*  HCT 29.8* 30.4*  MCV 88.4 89.4  PLT 219 237    Basic Metabolic Panel Recent Labs    12/04/20 0500 12/05/20 0518  NA 134* 133*  K 3.3* 3.7  CL 91* 90*  CO2 32 34*  GLUCOSE 108* 108*  BUN 30* 29*  CREATININE 1.22* 1.15*  CALCIUM 8.4* 8.8*  MG 2.2 2.3    Liver Function  Tests No results for input(s): AST, ALT, ALKPHOS, BILITOT, PROT, ALBUMIN in the last 72 hours.  No results for input(s): LIPASE, AMYLASE in the last 72 hours. Cardiac Enzymes No results for input(s): CKTOTAL, CKMB, CKMBINDEX, TROPONINI in the last 72 hours.  BNP: BNP (last 3 results) No results for input(s): BNP in the last 8760 hours.  ProBNP (last 3 results) No results for input(s): PROBNP in the last 8760 hours.   D-Dimer No results for input(s): DDIMER in the last 72 hours. Hemoglobin A1C No results for input(s): HGBA1C in the last 72 hours.  Fasting Lipid Panel No results for input(s): CHOL, HDL, LDLCALC, TRIG, CHOLHDL, LDLDIRECT in the last 72 hours.  Thyroid Function Tests No results for input(s): TSH, T4TOTAL, T3FREE, THYROIDAB in the last 72 hours.  Invalid input(s): FREET3  Other results:   Imaging    No results found.   Medications:     Scheduled Medications:  aspirin  81 mg Oral Daily   atorvastatin  80 mg Oral Daily   Chlorhexidine Gluconate Cloth  6 each Topical Daily   clopidogrel  75 mg Oral Daily   enoxaparin (LOVENOX) injection  40 mg Subcutaneous Q24H   insulin aspart  0-15 Units Subcutaneous TID WC   insulin aspart  0-5 Units Subcutaneous QHS   mouth rinse  15 mL Mouth Rinse BID  sodium chloride flush  10-40 mL Intracatheter Q12H   spironolactone  12.5 mg Oral Daily   torsemide  40 mg Oral Daily    Infusions:    PRN Medications: acetaminophen, ondansetron (ZOFRAN) IV    Patient Profile   75 y.o. female with history of CAD s/p prior MI in 2004 (details not known), PAD, DM, HTN, HLD. Now admitted with NSTEMI c/b acute systolic HF.  Assessment/Plan   Acute systolic HF: -2/2 NSTEMI - Echo LVEF 35-40%, RV okay - RHC 12/02/20  elevated filling pressures with very prominent v-waves in PCW tracing suggestive of significant MR (likely ischemic) - TEE 12/03/20 EF 35-40% moderate MR - has diuresed well. CVP 4 Co-ox 63% - Volume status  good on po torsemide.  - Midodrine stopped yesterday - Continue spiro.  - Start entresto 24/26 mg BID  - No b-blocker or SGLT2i yet  - Supp k.    2. NSTEMI: -Known history of CAD with prior MI in 2004 in [place] (details not known) -Severe diffuse 3 V CAD on LHC this admit.  - Cath films reviewed with Dr. Ali Lowe. PCI of ostial Lcx would be very high-risk with potential plaque shift into LM and LAD. Functional status significantly limited at baseline. Medical therapy only option at this time.  -HS troponin up to 1,249 -ASA + plavix -Continue Atorvastatin 80. LDL 60 - No s/s angina    3. Type II DM: -A1c 7.3 -Consider Farxiga prior to D/C -SSI   4. PAD: -Hx Viabahn covered stent left SFA, stent left TP trunk, PTA left posterior tibial artery 09/20 d/t CLI - ASA + statin   5. Anemia: - Stable  - hgb 9.7 -Iron stores okay -Continue to monitor -No obvious source of bleeding  6. Acute gout -Uric acid 9.3 -Right ankle pain. Has finished prednisone   7. Morbid obesity - Body mass index is 31.21 kg/m. - weight improving with diuresis  8. Physical deconditioning - spoke with nursing staff. She is very weak.  - PT recommending SNF. Consult SW/TOC  9. DNR/DNI - has met with Palliative Care. Code status updated.   Length of Stay: [place], MD  12/05/2020, 10:03 AM  Advanced Heart Failure Team Pager [phone] (M-F; 7a - 5p)  Please contact [facility] for night-coverage after hours (5p -7a ) and weekends on [email or web address]

## 2020-12-06 ENCOUNTER — Encounter (HOSPITAL_COMMUNITY): Payer: Medicare Other

## 2020-12-06 ENCOUNTER — Inpatient Hospital Stay (HOSPITAL_COMMUNITY): Payer: Medicare Other

## 2020-12-06 DIAGNOSIS — I5021 Acute systolic (congestive) heart failure: Secondary | ICD-10-CM | POA: Diagnosis not present

## 2020-12-06 DIAGNOSIS — I214 Non-ST elevation (NSTEMI) myocardial infarction: Secondary | ICD-10-CM | POA: Diagnosis not present

## 2020-12-06 LAB — BASIC METABOLIC PANEL
Anion gap: 8 (ref 5–15)
BUN: 25 mg/dL — ABNORMAL HIGH (ref 8–23)
CO2: 35 mmol/L — ABNORMAL HIGH (ref 22–32)
Calcium: 8.6 mg/dL — ABNORMAL LOW (ref 8.9–10.3)
Chloride: 89 mmol/L — ABNORMAL LOW (ref 98–111)
Creatinine, Ser: 1.16 mg/dL — ABNORMAL HIGH (ref 0.44–1.00)
GFR, Estimated: 49 mL/min — ABNORMAL LOW (ref >60)
Glucose, Bld: 160 mg/dL — ABNORMAL HIGH (ref 70–99)
Potassium: 3.6 mmol/L (ref 3.5–5.1)
Sodium: 132 mmol/L — ABNORMAL LOW (ref 135–145)

## 2020-12-06 LAB — CBC
HCT: 29.6 % — ABNORMAL LOW (ref 36.0–46.0)
Hemoglobin: 9.4 g/dL — ABNORMAL LOW (ref 12.0–15.0)
MCH: 28.7 pg (ref 26.0–34.0)
MCHC: 31.8 g/dL (ref 30.0–36.0)
MCV: 90.2 fL (ref 80.0–100.0)
Platelets: 230 10*3/uL (ref 150–400)
RBC: 3.28 MIL/uL — ABNORMAL LOW (ref 3.87–5.11)
RDW: 14.6 % (ref 11.5–15.5)
WBC: 8.6 10*3/uL (ref 4.0–10.5)
nRBC: 0 % (ref 0.0–0.2)

## 2020-12-06 LAB — GLUCOSE, CAPILLARY
Glucose-Capillary: 149 mg/dL — ABNORMAL HIGH (ref 70–99)
Glucose-Capillary: 205 mg/dL — ABNORMAL HIGH (ref 70–99)
Glucose-Capillary: 180 mg/dL — ABNORMAL HIGH (ref 70–99)
Glucose-Capillary: 200 mg/dL — ABNORMAL HIGH (ref 70–99)
Glucose-Capillary: 163 mg/dL — ABNORMAL HIGH (ref 70–99)

## 2020-12-06 LAB — COOXEMETRY PANEL
Carboxyhemoglobin: 1.3 % (ref 0.5–1.5)
Methemoglobin: 0.7 % (ref 0.0–1.5)
O2 Saturation: 65.8 %
Total hemoglobin: 9.6 g/dL — ABNORMAL LOW (ref 12.0–16.0)

## 2020-12-06 LAB — MAGNESIUM: Magnesium: 2.3 mg/dL (ref 1.7–2.4)

## 2020-12-06 MED ORDER — SACUBITRIL-VALSARTAN 24-26 MG PO TABS
1.0000 | ORAL_TABLET | Freq: Two times a day (BID) | ORAL | Status: DC
  Administered 2020-12-07 – 2020-12-09 (×5): 1 via ORAL
  Filled 2020-12-06 (×5): qty 1

## 2020-12-06 MED ORDER — LORAZEPAM 2 MG/ML IJ SOLN
1.0000 mg | Freq: Once | INTRAMUSCULAR | Status: AC
  Administered 2020-12-06: 1 mg via INTRAVENOUS
  Filled 2020-12-06: qty 1

## 2020-12-06 MED ORDER — POTASSIUM CHLORIDE CRYS ER 20 MEQ PO TBCR
40.0000 meq | EXTENDED_RELEASE_TABLET | Freq: Once | ORAL | Status: AC
  Administered 2020-12-06: 40 meq via ORAL
  Filled 2020-12-06: qty 2

## 2020-12-06 NOTE — Progress Notes (Signed)
This chaplain responded to PMT consult for facilitating a notary for the Pt. Advance Directive.  The Pt. RN-Erica updated the chaplain before the visit, recommending a pause in the notary visit.  The chaplain understands the Pt. had a recent dose of Ativan and is experiencing slurred speech today. This chaplain will F/U on Tuesday.  Chaplain Stephanie Acre [phone]

## 2020-12-06 NOTE — Consult Note (Addendum)
Neurology Consultation  Reason for Consult: Stuttering speech Referring Physician: Darrick Grinder, NP  CC: Shortness of breath, stuttering speech  History is obtained from: Bedside RN and NP, patient, patient's daughter at bedside, chart review  HPI: Brittney [name] is a 75 y.o. female with a medical history significant for coronary artery disease, type 2 diabetes mellitus, hyperlipidemia, DVT, essential hypertension, remote MI s/p stent placement, and peripheral arterial disease who presented to the ED 10/29 for evaluation of 3-4 days of progressive dyspnea with chest tightness who was admitted for NSTEMI complicated by acute systolic heart failure.   Today, the bedside RN states that she went in to the patient's room at 08:00 to give her morning medications and at that time, her speech was at baseline. At around 10:00 this morning, the patient had an acute speech change described as stuttering speech and a Code Stroke was activated. Patient's granddaughter at bedside states that her grandmother has had intermittent stuttering speech over the past 2-3 days and the patient states that she feels like she is having trouble speaking with her shortness of breath.   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 Patient's speech is stuttering without dysarthria or aphasia.  IR Thrombectomy? No, presentation is not consistent with an LVO Modified Rankin Scale: 2-Slight disability-UNABLE to perform all activities but does not need assistance  ROS: A complete ROS was performed and is negative except as noted in the HPI.   Past Medical History:  Diagnosis Date   Arthritis    Coronary artery  disease    Diabetes mellitus without complication ([place])    DVT (deep venous thrombosis) ([place])    Heart attack ([place])    Hyperlipidemia    Hypertension    Stroke [facility])    Past Surgical History:  Procedure Laterality Date   ABDOMINAL AORTOGRAM W/LOWER EXTREMITY Bilateral 10/28/2018   Procedure: ABDOMINAL AORTOGRAM W/LOWER EXTREMITY;  Surgeon: Waynetta Sandy, MD;  Location: [place] [facility];  Service: Cardiovascular;  Laterality: Bilateral;   CATARACT EXTRACTION     CESAREAN SECTION     CORONARY ANGIOPLASTY WITH STENT PLACEMENT     LEFT HEART CATH AND CORONARY ANGIOGRAPHY N/A 11/27/2020   Procedure: LEFT HEART CATH AND CORONARY ANGIOGRAPHY;  Surgeon: Early Osmond, MD;  Location: [place] [facility];  Service: Cardiovascular;  Laterality: N/A;   PERIPHERAL VASCULAR INTERVENTION  10/28/2018   Procedure: PERIPHERAL VASCULAR INTERVENTION;  Surgeon: Waynetta Sandy, MD;  Location: [place] [facility];  Service: Cardiovascular;;   RIGHT HEART CATH N/A 12/02/2020   Procedure: RIGHT HEART CATH;  Surgeon: Jolaine Artist, MD;  Location: [place] [facility];  Service: Cardiovascular;  Laterality: N/A;   TEE WITHOUT CARDIOVERSION N/A 12/03/2020   Procedure: TRANSESOPHAGEAL ECHOCARDIOGRAM (TEE);  Surgeon: Jolaine Artist, MD;  Location: [facility];  Service: Cardiovascular;  Laterality: N/A;   History reviewed. No pertinent family history.  Social History:   reports that she has never smoked. She has never used smokeless tobacco. She reports that she does not drink alcohol and does not use drugs.  Medications  Current Facility-Administered Medications:    acetaminophen (TYLENOL) tablet 650 mg, 650 mg, Oral, Q4H PRN, Marlyce Huge  Joni Reining, PA-C, 650 mg at 12/05/20 1037   aspirin chewable tablet 81 mg, 81 mg, Oral, Daily, Andrey Farmer, PA-C, 81 mg at 12/06/20 1005   atorvastatin (LIPITOR) tablet 80 mg, 80 mg, Oral, Daily, Andrey Farmer, PA-C, 80 mg at  12/06/20 1006   Chlorhexidine Gluconate Cloth 2 % PADS 6 each, 6 each, Topical, Daily, Andrey Farmer, PA-C, 6 each at 12/05/20 1124   clopidogrel (PLAVIX) tablet 75 mg, 75 mg, Oral, Daily, Andrey Farmer, PA-C, 75 mg at 12/06/20 1005   enoxaparin (LOVENOX) injection 40 mg, 40 mg, Subcutaneous, Q24H, Earnie Larsson, RPH, 40 mg at 12/05/20 2151   glycerin (Pediatric) 1 g suppository 2 g, 2 suppository, Rectal, Once, Orbie Pyo, MD   insulin aspart (novoLOG) injection 0-15 Units, 0-15 Units, Subcutaneous, TID WC, Andrey Farmer, PA-C, 2 Units at 12/06/20 0751   insulin aspart (novoLOG) injection 0-5 Units, 0-5 Units, Subcutaneous, QHS, Andrey Farmer, [place], 2 Units at 12/04/20 2154   MEDLINE mouth rinse, 15 mL, Mouth Rinse, BID, Andrey Farmer, PA-C, 15 mL at 12/05/20 2152   ondansetron [facility]) injection 4 mg, 4 mg, Intravenous, Q6H PRN, Andrey Farmer, PA-C   [START ON 12/07/2020] sacubitril-valsartan (ENTRESTO) 24-26 mg per tablet, 1 tablet, Oral, BID, Andrey Farmer, PA-C   senna-docusate (Senokot-S) tablet 1 tablet, 1 tablet, Oral, QHS, Bensimhon, Bevelyn Buckles, MD, 1 tablet at 12/05/20 1349   sodium chloride flush (NS) 0.9 % injection 10-40 mL, 10-40 mL, Intracatheter, Q12H, Andrey Farmer, PA-C, 10 mL at 12/06/20 1013   sorbitol 70 % solution 30 mL, 30 mL, Oral, Once, Bensimhon, Bevelyn Buckles, MD   spironolactone (ALDACTONE) tablet 12.5 mg, 12.5 mg, Oral, Daily, Bensimhon, Bevelyn Buckles, MD, 12.5 mg at 12/06/20 1005  Exam: Current vital signs: BP 133/72 (BP Location: Right Arm)   Pulse 73   Temp 98.1 F (36.7 C)   Resp 17   Ht 5\' 7"  (1.702 m)   Wt 91.2 kg   SpO2 99%   BMI 31.49 kg/m  Vital signs in last 24 hours: Temp:  [98.1 F (36.7 C)-98.8 F (37.1 C)] 98.1 F (36.7 C) (11/07 0732) Pulse Rate:  [73-81] 73 (11/07 1116) Resp:  [10-19] 17 (11/07 1116) BP: (129-143)/(63-72) 133/72 (11/07 1116) SpO2:  [65 %-100 %] 99 % (11/07  1116) Weight:  [91.2 kg] 91.2 kg (11/07 0433)  GENERAL: Awake, alert, appears short of breath on examination Psych: Affect appropriate for situation, patient is calm and cooperative with examination Head: Normocephalic and atraumatic, without obvious abnormality EENT: Normal conjunctivae, dry mucous membranes, no OP obstruction. No scleral injection.  LUNGS: Increased respiratory rate with shallow respirations. CV: Regular rate and rhythm on telemetry ABDOMEN: Soft, non-tender, non-distended Extremities: warm, well perfused, without obvious deformity  NEURO:  Mental Status: Awake, alert, and oriented to person, place, time, and situation. She is able to provide a clear and coherent history of present illness. Speech/Language: speech is intact without dysarthria.   Naming, repetition, fluency, and comprehension intact without aphasia. No neglect is noted Cranial Nerves:  II: PERRL 3 mm/brisk. Visual fields full.  III, IV, VI: EOMI without ptosis, nystagmus, or gaze preference.  V: Sensation is intact to light touch and symmetrical to face.  VII: Face is symmetric resting and smiling.  VIII: Hearing is intact to voice IX, X: Palate elevation is symmetric. Phonation normal.  XI: Normal sternocleidomastoid and trapezius muscle strength XII: Tongue protrudes midline without fasciculations.   Motor: 5/5 strength  present in bilateral upper extremities without vertical drift.  Bilateral lower extremities elevate with antigravity movement without vertical drift.  Tone is normal. Bulk is normal.  Sensation: Intact to light touch bilaterally in all four extremities.  Coordination: FTN intact bilaterally. HKS intact bilaterally. Gait: Deferred  NIHSS: 0  Labs I have reviewed labs in epic and the results pertinent to this consultation are: CBC    Component Value Date/Time   WBC 8.6 12/06/2020 0415   RBC 3.28 (L) 12/06/2020 0415   HGB 9.4 (L) 12/06/2020 0415   HCT 29.6 (L) 12/06/2020  0415   PLT 230 12/06/2020 0415   MCV 90.2 12/06/2020 0415   MCH 28.7 12/06/2020 0415   MCHC 31.8 12/06/2020 0415   RDW 14.6 12/06/2020 0415   LYMPHSABS 2.3 11/27/2020 1859   MONOABS 0.4 11/27/2020 1859   EOSABS 0.1 11/27/2020 1859   BASOSABS 0.1 11/27/2020 1859   CMP     Component Value Date/Time   NA 132 (L) 12/06/2020 0415   K 3.6 12/06/2020 0415   CL 89 (L) 12/06/2020 0415   CO2 35 (H) 12/06/2020 0415   GLUCOSE 160 (H) 12/06/2020 0415   BUN 25 (H) 12/06/2020 0415   CREATININE 1.16 (H) 12/06/2020 0415   CALCIUM 8.6 (L) 12/06/2020 0415   PROT 7.9 11/27/2020 1859   ALBUMIN 3.5 11/27/2020 1859   AST 23 11/27/2020 1859   ALT 21 11/27/2020 1859   ALKPHOS 44 11/27/2020 1859   BILITOT 0.4 11/27/2020 1859   GFRNONAA 49 (L) 12/06/2020 0415   GFRAA 66 (L) 02/27/2013 2026   Lipid Panel     Component Value Date/Time   CHOL 155 11/27/2020 1859   TRIG 94 11/27/2020 1859   HDL 73 11/27/2020 1859   CHOLHDL 2.1 11/27/2020 1859   VLDL 19 11/27/2020 1859   LDLCALC NOT CALCULATED 11/27/2020 1859   LDLDIRECT 60.7 11/29/2020 0058   Lab Results  Component Value Date   HGBA1C 7.3 (H) 11/27/2020   Imaging I have reviewed the images obtained:  MRI examination of the brain: Punctate acute infarct in the right anterior parietal lobe, with additional acute or subacute punctate infarcts in the left frontal lobe, right parietal lobe, and left cerebellar hemisphere. Given different vascular territories, embolic etiology is suspected.  Echocardiogram 10/30:  1. Akinesis of the inferolateral wall, distal inferior wall and apex;  overall moderate LV dysfunction.   2. Left ventricular ejection fraction, by estimation, is 35 to 40%. The  left ventricle has moderately decreased function. The left ventricle  demonstrates regional wall motion abnormalities (see scoring  diagram/findings for description). Left ventricular   diastolic parameters are consistent with Grade III diastolic dysfunction   (restrictive). Elevated left atrial pressure.   3. Right ventricular systolic function is normal. The right ventricular  size is normal.   4. Left atrial size was moderately dilated.   5. Moderate pleural effusion in the left lateral region.   6. The mitral valve is normal in structure. Mild mitral valve  regurgitation. No evidence of mitral stenosis.   7. The aortic valve is tricuspid. Aortic valve regurgitation is not  visualized. Mild aortic valve sclerosis is present, with no evidence of  aortic valve stenosis.   8. The inferior vena cava is normal in size with greater than 50%  respiratory variability, suggesting right atrial pressure of 3 mmHg.   Additionally patient had a TEE 11/4 which was negative for intracardiac thrombus  Assessment: 75 y.o. female admitted to the ICU for NSTEMI  and acute heart failure management who had an acute speech change this morning with stuttering speech.  - Examination reveals patient with varied degrees of stuttering speech without further neurologic deficits. Patient states that her speech is limited because she feels like she cannot catch her breath. Patient's granddaughter states that her speech had been intermittently stuttering for 2-3 days. On evaluation, patient's NIHSS is 0. - Due to patient's last known well time being 2-3 days prior with multiple stroke risk factors, the Code Stroke was cancelled with plans for MRI brain to definitively rule out stroke.  - Patient's stroke risk factors with CAD, PAD, NSTEMI, history of DVT, obesity, advanced age, HTN, and HLD  Recommendations: - MRI brain without contrast - If negative, no further neurology work up at this time, will expand further stroke work up with MRI brain findings of acute stroke  Anibal Henderson, AGAC-NP [facility] Pager: [phone]  Update 16:16: MRI Brain reviewed with attending neurologist with radiology impression as follows: Punctate acute infarct in the right  anterior parietal lobe, with additional acute or subacute punctate infarcts in the left frontal lobe, right parietal lobe, and left cerebellar hemisphere. Given different vascular territories, embolic etiology is suspected.  These punctate strokes may be secondary to her recent cardiac catheterization as well, less likely low EF  Recommendations: - Query possible atrial fibrillation with suspected embolic etiology, appreciate review of telemetry by cardiology - LDL at goal, A1c slightly higher than goal of < 7% - Stroke prophylaxis: Patient is on ASA and clopidogrel, okay to continue but if there were to be evidence of atrial fibrillation, would need to consider anticoagulation medications - Carotid duplex to complete stroke work-up  Additional attending addendum: Discussed with cardiology attending who is concerned about apical thrombus despite prior negative TEE, given the pattern of her strokes I agree with Dr. Haroldine Laws that anticoagulation is reasonable.  Given the very small size of her strokes, low goal no bolus heparin drip is appropriate with transition to oral anticoagulation if she remains stable for 24 hours after therapeutic heparin dose achieved.  Neurology will follow up carotid duplex but otherwise will be available on an as-needed basis going forward  Anibal Henderson, [place] Pager: [phone]  Attending Neurologist's note:  I personally saw this patient, gathering history, performing the neurologic examination, reviewing relevant labs, personally reviewing relevant imaging including MRI brain, and formulated the assessment and plan, adding the note above for completeness and clarity to accurately reflect my thoughts  Lesleigh Noe MD-PhD [facility] [phone]  Available 7 AM to 7 PM, outside these hours please contact Neurologist on call listed on AMION

## 2020-12-06 NOTE — Code Documentation (Signed)
Code stroke called due to the patient expressing difficulties speaking. Upon stroke team arrival patient NIHSS 0 but intermittently stuttering when speaking. Patient escorted to MRI and returned to inpatient room. Discussed with RN, Alcario Drought Q2 NIHSS and vital signs.

## 2020-12-06 NOTE — Progress Notes (Signed)
Pt noted to have difficulty speaking. Each word in her sentences has 6 to 7 stutters before she is able to get the word out. Pt noted to have no one sided deficiencies. This RN called Palliative NP to confirm that she had seen no deficits during her exam to try and determine last known well. HF team paged. Consulting civil engineer notified. Rapid RN Helle notified via phone call. Code Stroke activated.   12/06/20 1116  Assess: MEWS Score  Temp 97.8 F (36.6 C)  BP 133/72  Pulse Rate 73  ECG Heart Rate 74  Resp 17  Level of Consciousness Alert  SpO2 99 %  O2 Device Nasal Cannula  Patient Activity (if Appropriate) In bed  O2 Flow Rate (L/min) 3 L/min  Assess: MEWS Score  MEWS Temp 0  MEWS Systolic 0  MEWS Pulse 0  MEWS RR 0  MEWS LOC 0  MEWS Score 0  MEWS Score Color Green  Assess: if the MEWS score is Yellow or Red  Were vital signs taken at a resting state? Yes  Focused Assessment Change from prior assessment (see assessment flowsheet)  Treat  Pain Scale 0-10  Pain Score 0  Notify: Charge Nurse/RN  Name of Charge Nurse/RN Notified Rhae Hammock, RN  Date Charge Nurse/RN Notified 12/06/20  Time Charge Nurse/RN Notified 1118  Notify: Provider  Provider Name/Title Tonye Becket, NP  Date Provider Notified 12/06/20  Time Provider Notified 1117  Notification Type Page  Notification Reason Change in status  Provider response En route  Notify: Rapid Response  Name of Rapid Response RN Notified Helle, RN  Date Rapid Response Notified 12/06/20  Time Rapid Response Notified 1118  Document  Patient Outcome Transferred/level of care increased  Progress note created (see row info) Yes

## 2020-12-06 NOTE — TOC Progression Note (Signed)
Transition of Care [facility]) - Progression Note    Patient Details  Name: Brittney [name] MRN: [id number] Date of Birth: January 12, 1946  [facility] of [facility]) CM/SW Contact  Rome Schlauch, LCSWA Phone Number: 12/06/2020, 11:57 AM  Clinical Narrative:    HF CSW attempted to see [name] at bedside but possible code stroke and she will need an MRI and CSW will follow up again later to discuss discharge plan. DNR signed and on the front of the patient's chart.   Expected Discharge Plan: Skilled Nursing Facility Barriers to Discharge: English as a second language teacher, SNF Pending bed offer, Continued Medical Work up  Expected Discharge Plan and Services Expected Discharge Plan: Skilled Nursing Facility In-house Referral: Clinical Social Work Discharge Planning Services: CM Consult Post Acute Care Choice: Home Health Living arrangements for the past 2 months: Single Family Home                                       Social Determinants of Health (SDOH) Interventions    Readmission Risk Interventions No flowsheet data found.  Haivyn Oravec, MSW, LCSWA [phone] Heart Failure Social Worker

## 2020-12-06 NOTE — Progress Notes (Signed)
Responded to code stroke. Patient has PICC line. No needs at this time. Tomasita Morrow, RN VAST

## 2020-12-06 NOTE — Progress Notes (Addendum)
   Called by staff nurse regarding change in neuro status.  Stuttering and having difficulty finding words.   Code Stroke Called.   On arrival Complaining of buttock pain.  Stuttering with conversation. Says she has been stuttering for the last few days.  BP stable.  MAE x4.   Neurology at bedside. - Having some stuttering. Otherwise neuro intact.  Neurology recommending - MRI and if ok no further work up.   Discussed with Dr Gala Romney.  Tonye Becket NP- C 11:54 AM

## 2020-12-06 NOTE — Progress Notes (Addendum)
   Palliative Medicine Inpatient Follow Up Note  Consulting Provider: Joette Catching, PA-C   Reason for consult:   [place]  Reason for Consult? GOC discussion    HPI:  Per intake H&P --> 75 y.o. female with history of CAD s/p prior MI in 2004 (details not known), PAD, DM, HTN, HLD. Now admitted with NSTEMI c/b acute systolic HF.   Palliative care has been requested to get involved to further discuss goals of care.  Today's Discussion (12/06/2020):  *Please note that this is a verbal dictation therefore any spelling or grammatical errors are due to the "[place] [facility]" system interpretation.  Chart reviewed. PT got patient to chair yesterday, patient unable to mobilize far.   I met with Brittney [name] at bedside this morning. She was awake and oriented. She had no concerns though did complaint that she is feeling tired.  We reviewed the plan for skilled nursing placement once she is medically optimized.  I spoke to our chaplain who plans to help complete HCPOA documents today. ___________________________________ Addendum:  Got a call from patients RN this late morning out of concern that Brittney [name] was stuttering. I reviewed that on the days I have worked with her she has not been stuttering recurrently. I recommended she follow up with the primary team as an MRI will likely be needed for further characterization if a stroke is suspected.  Objective Assessment: Vital [name] Vitals:   12/06/20 0614 12/06/20 0732  BP:  (!) 143/64  Pulse: 77 76  Resp: 19 16  Temp:  98.1 F (36.7 C)  SpO2: 100% (!) 65%    Intake/Output Summary (Last 24 hours) at 12/06/2020 0857 Last data filed at 12/06/2020 0500 Gross per 24 hour  Intake 550 ml  Output 1700 ml  Net -1150 ml    Last Weight  Most recent update: 12/06/2020  4:34 AM    Weight  91.2 kg (201 lb 1 oz)            Gen:  [name] in NAD HEENT: moist mucous membranes CV:  Regular rate and rhythm  PULM: On  2LPM [name] ABD: soft/nontender  EXT: (+) BLE edema Neuro: Alert and oriented x3  SUMMARY OF RECOMMENDATIONS   DNAR/DNI   MOST/DNR Form Completed, paper copy placed onto the chart electric copy can be found in [employer] documents completed - Will need to be notarized today chaplain is aware  Plan for SNF placement on discharge (family prefers a SNF in [place])  TOC - OP Palliative support as discussed with patient and her daughter, Brittney [name]   Ongoing incremental support until discharged   Time Spent: 15 Greater than 50% of the time was spent in counseling and coordination of care ______________________________________________________________________________________ [place] Team Team Cell Phone: [phone] Please utilize secure chat with additional questions, if there is no response within 30 minutes please call the above phone number  Palliative Medicine Team providers are available by phone from 7am to 7pm daily and can be reached through the team cell phone.  Should this patient require assistance outside of these hours, please call the patient's attending physician.

## 2020-12-06 NOTE — Discharge Summary (Addendum)
Advanced Heart Failure Team  Discharge Summary   Patient ID: Brittney [name] MRN: [id number], DOB/AGE: 1945/10/05 75 y.o. Admit date: 11/27/2020 D/C date:     12/09/2020   Primary Discharge Diagnoses:  Acute systolic HF CAD with NSTEMI Type II DM PAD Anemia Gout CVA, acute Obesity  Hospital Course:  Charie Pinkus is a 75 y.o. female with history of CAD with prior MI and stenting in 2004, HTN, dyslipidemia, DM, hx CVA, PAD with hx CLI s/p Viabahn covered stent left SFA, stent left TP trunk, PTA left posterior tibial artery 09/20.   Admitted 11/27/2020 with NSTEMI. LHC demonstrated severe 3 vessel CAD including occluded RCA and high-grade ostial/proximal stenosis in moderate-sized LCx. Treated medically as no acute culprit and PCI LCX would be extremely high risk. LVEDP 26 mmHg. Diuresed with IV lasix.   Echo 10/30: LVEF 35-40%, akinesis of inferolateral wall, distal inferior wall and apex, RV okay, mild MR.    [place] 11/02: RA 8, PA 56/21 (38), Fick CO/CI 6.9/3.3, Thermo CO/CI 5.8/2.8. Prominent v waves on PCW concerning for significant MR. TEE 11/04: 2-3 + moderate central MR.  On 11/07, intermittent stuttering speech noted. Neurology consulted. MRI brain demonstrated punctate acute infarct in right anterior parietal lobe with additional acute or subacute infarcts left frontal lobe, right parietal lobe and left cerebellar hemisphere. Embolic etiology suspected. Placed eliquis and aspirin for. Aspirin will stop after 30 days.   Seen by PT. Remained very weak throughout hospital stay and did not progress to a functional level to return home. SNF recommended for rehab at discharge.  Palliative care assisted with GOC discussion. Code status changed - DNR/DNI.  See below for detailed problems list. He will contine to be followed closely in the HF clinic.   Acute systolic HF: -2/2 NSTEMI - Echo LVEF 35-40%, RV okay - [facility]HC 12/02/20  elevated filling pressures with very prominent v-waves in  PCW tracing suggestive of significant MR (likely ischemic) - TEE 12/03/20 EF 35-40% moderate MR - Volume status stable. Stop torsemide. Change to 40 mg po lasix daily with addition of jardiance 10 mg daily.   - Continue spiro 12.5 mg daily - Continue  Entresto 24/26 bid. Keep SBP > 110  - No b-blocker for now.    2. NSTEMI: -Known history of CAD with prior MI in 2004 in [place] (details not known) -Severe diffuse 3 V CAD on LHC this admit.  - Cath films reviewed with Dr. Ali Lowe. PCI of ostial Lcx would be very high-risk with potential plaque shift into LM and LAD. Functional status significantly limited at baseline. Medical therapy only option at this time.  -HS troponin up to 1,249 - With addition of Eliquis, plavix stopped. Continue ASA 81 x 30 days.  -Continue Atorvastatin 80. LDL 60 - No s/s angina    3. 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 No presence of AF on monitor - Neuro appreciated.   - Now on eliquis and continue aspirin for 30 days. - continue statin - PT/OT. Continue at Rehab.  - carotid u/s no significant extracranial stenosis.    4. PAD: -Hx Viabahn covered stent left SFA, stent left TP trunk, PTA left posterior tibial artery 09/20 d/t CLI - ASA + statin   5. Anemia: - Stable  - hgb 9.9  -Iron stores okay -Continue to monitor -No obvious source of bleeding   6. Acute gout -Uric acid 9.3 -Right ankle pain. Has finished prednisone    7. Morbid obesity - Body mass index is 31.56 kg/m. -  weight improving with diuresis   8. Physical deconditioning - spoke with nursing staff. She is very weak.  - SNF today.    9. Type II DM: -A1c 7.3 -Started jardiance 10 mg daily.  - Resume metformin. - SSI with meals.   10. DNR/DNI - has met with Palliative Care. Code status updated.    12/08/20 COVID SARS2 negative    OK for discharge to SNF [facility].   Discharge Vitals: Blood pressure (!) 108/47, pulse 74, temperature 97.8 F (36.6 C), temperature source Oral,  resp. rate 17, height '5\' 7"'  (1.702 m), weight 91.4 kg, SpO2 99 %.  Labs: Lab Results  Component Value Date   WBC 8.1 12/09/2020   HGB 9.9 (L) 12/09/2020   HCT 31.4 (L) 12/09/2020   MCV 89.0 12/09/2020   PLT 262 12/09/2020    Recent Labs  Lab 12/08/20 0049  NA 129*  K 3.7  CL 89*  CO2 30  BUN 23  CREATININE 1.10*  CALCIUM 8.5*  GLUCOSE 143*   Lab Results  Component Value Date   CHOL 122 12/07/2020   HDL 52 12/07/2020   LDLCALC 57 12/07/2020   TRIG 66 12/07/2020   BNP (last 3 results) No results for input(s): BNP in the last 8760 hours.  ProBNP (last 3 results) No results for input(s): PROBNP in the last 8760 hours.   Diagnostic Studies/Procedures   VAS US CAROTID  Result Date: 12/07/2020 Carotid Arterial Duplex Study Patient Name:  EDMUND RICK  Date of Exam:   12/07/2020 Medical Rec #: [id number]       Accession #:    [id number] Date of Birth: 29-Dec-1945      Patient Gender: F Patient Age:   67 years Exam Location:  [facility] Procedure:      VAS US CAROTID Referring Phys: Lesleigh Noe --------------------------------------------------------------------------------  Indications:       CVA. Risk Factors:      Hypertension, hyperlipidemia, Diabetes, prior CVA. Comparison Study:  no prior Performing Technologist: Archie Patten RVS  Examination Guidelines: A complete evaluation includes B-mode imaging, spectral Doppler, color Doppler, and power Doppler as needed of all accessible portions of each vessel. Bilateral testing is considered an integral part of a complete examination. Limited examinations for reoccurring indications may be performed as noted.  Right Carotid Findings: +----------+--------+--------+--------+------------------+--------+           PSV cm/sEDV cm/sStenosisPlaque DescriptionComments +----------+--------+--------+--------+------------------+--------+ CCA Prox  68      10              heterogenous                +----------+--------+--------+--------+------------------+--------+ CCA Distal85      15              heterogenous               +----------+--------+--------+--------+------------------+--------+ ICA Prox  88      27      1-39%   heterogenous               +----------+--------+--------+--------+------------------+--------+ ICA Distal54      17                                         +----------+--------+--------+--------+------------------+--------+ ECA       125                                                +----------+--------+--------+--------+------------------+--------+ +----------+--------+-------+--------+-------------------+  PSV cm/sEDV cmsDescribeArm Pressure (mmHG) +----------+--------+-------+--------+-------------------+ Subclavian120                                        +----------+--------+-------+--------+-------------------+ +---------+--------+--+--------+--+---------+ VertebralPSV cm/s54EDV cm/s17Antegrade +---------+--------+--+--------+--+---------+  Left Carotid Findings: +----------+--------+--------+--------+------------------+--------+           PSV cm/sEDV cm/sStenosisPlaque DescriptionComments +----------+--------+--------+--------+------------------+--------+ CCA Prox  102                     heterogenous               +----------+--------+--------+--------+------------------+--------+ CCA Distal68      8               heterogenous               +----------+--------+--------+--------+------------------+--------+ ICA Prox  31      10      1-39%   heterogenous               +----------+--------+--------+--------+------------------+--------+ ICA Distal37      11                                         +----------+--------+--------+--------+------------------+--------+ ECA       246             >50%                                +----------+--------+--------+--------+------------------+--------+ +----------+--------+--------+--------+-------------------+           PSV cm/sEDV cm/sDescribeArm Pressure (mmHG) +----------+--------+--------+--------+-------------------+ Subclavian105                                         +----------+--------+--------+--------+-------------------+ +---------+--------+--+--------+--+---------+ VertebralPSV cm/s52EDV cm/s11Antegrade +---------+--------+--+--------+--+---------+   Summary: Right Carotid: Velocities in the right ICA are consistent with a 1-39% stenosis. Left Carotid: Velocities in the left ICA are consistent with a 1-39% stenosis.               The ECA appears >50% stenosed. Vertebrals: Bilateral vertebral arteries demonstrate antegrade flow. *See table(s) above for measurements and observations.  Electronically signed by Antony Contras MD on 12/07/2020 at 5:53:23 PM.    Final    VAS [place] TRANSCRANIAL DOPPLER  Result Date: 12/07/2020  Transcranial Doppler Patient Name:  RANDY WHITENER  Date of Exam:   12/07/2020 Medical Rec #: [id number]       Accession #:    [id number] Date of Birth: 1945-03-25      Patient Gender: F Patient Age:   77 years Exam Location:  [facility] Procedure:      VAS [place] TRANSCRANIAL DOPPLER Referring Phys: PRAMOD SETHI --------------------------------------------------------------------------------  Indications: Stroke. Performing Technologist: Archie Patten RVS  Examination Guidelines: A complete evaluation includes B-mode imaging, spectral Doppler, color Doppler, and power Doppler as needed of all accessible portions of each vessel. Bilateral testing is considered an integral part of a complete examination. Limited examinations for reoccurring indications may be performed as noted.  +----------+-------------+----------+-----------+------------------+ RIGHT TCD Right VM (cm)Depth (cm)Pulsatility     Comment        +----------+-------------+----------+-----------+------------------+ MCA           47.00  1.21                       +----------+-------------+----------+-----------+------------------+ ACA                                         unable to insonate +----------+-------------+----------+-----------+------------------+ Term ICA      61.00                 1.11                       +----------+-------------+----------+-----------+------------------+ PCA           26.00                 1.35                       +----------+-------------+----------+-----------+------------------+ Opthalmic     14.00                 1.65                       +----------+-------------+----------+-----------+------------------+ ICA siphon                                  unable to insonate +----------+-------------+----------+-----------+------------------+ Vertebral                                   unable to insonate +----------+-------------+----------+-----------+------------------+  +----------+------------+----------+-----------+------------------+ LEFT TCD  Left VM (cm)Depth (cm)Pulsatility     Comment       +----------+------------+----------+-----------+------------------+ MCA          70.00                 1.38                       +----------+------------+----------+-----------+------------------+ ACA                                        unable to insonate +----------+------------+----------+-----------+------------------+ Term ICA     62.00                 1.68                       +----------+------------+----------+-----------+------------------+ PCA          42.00                 1.07                       +----------+------------+----------+-----------+------------------+ Opthalmic    11.00                 0.89                       +----------+------------+----------+-----------+------------------+ ICA siphon                                  unable to insonate +----------+------------+----------+-----------+------------------+ Vertebral  unable to insonate +----------+------------+----------+-----------+------------------+  +------------+-----+------------------+             VM cm     Comment       +------------+-----+------------------+ Prox Basilar     unable to insonate +------------+-----+------------------+ Dist Basilar     unable to insonate +------------+-----+------------------+ Summary:  Absent suboccipital window limits evaluation of posterior circulation vessels. normal mean flow velocitie sin identified vessels of anterior circulation with elevated pulsatility indices suggests diffuse intracranial atherosclerosis. *See table(s) above for TCD measurements and observations.  Diagnosing physician: Antony Contras MD Electronically signed by Antony Contras MD on 12/07/2020 at 5:54:59 PM.    Final     Discharge Medications   Allergies as of 12/09/2020       Reactions   Iodinated Diagnostic Agents Nausea And Vomiting   Shellfish Allergy Nausea And Vomiting        Medication List     STOP taking these medications    amLODipine 10 MG tablet Commonly known as: NORVASC   clopidogrel 75 MG tablet Commonly known as: PLAVIX   Fish Oil 1000 MG Caps   gabapentin 300 MG capsule Commonly known as: NEURONTIN   hydrALAZINE 25 MG tablet Commonly known as: APRESOLINE   insulin glargine 100 UNIT/ML injection Commonly known as: LANTUS   metoprolol succinate 100 MG 24 hr tablet Commonly known as: TOPROL-XL   pioglitazone 30 MG tablet Commonly known as: ACTOS   silver sulfADIAZINE 1 % cream Commonly known as: SILVADENE   triamcinolone lotion 0.1 % Commonly known as: KENALOG       TAKE these medications    apixaban 5 MG Tabs tablet Commonly known as: ELIQUIS Take 1 tablet (5 mg total) by mouth 2 (two) times daily.   aspirin 81 MG tablet Take 1  tablet (81 mg total) by mouth daily.   atorvastatin 40 MG tablet Commonly known as: LIPITOR Take 40 mg by mouth at bedtime.   empagliflozin 10 MG Tabs tablet Commonly known as: JARDIANCE Take 1 tablet (10 mg total) by mouth daily.   furosemide 40 MG tablet Commonly known as: LASIX Take 40 mg by mouth daily.   hydrOXYzine 10 MG tablet Commonly known as: ATARAX/VISTARIL Take 10 mg by mouth 3 (three) times daily as needed for itching.   insulin aspart 100 UNIT/ML injection Commonly known as: novoLOG Inject 0-15 Units into the skin 3 (three) times daily with meals.   metFORMIN 1000 MG tablet Commonly known as: GLUCOPHAGE Take 1,000 mg by mouth 2 (two) times daily with a meal.   sacubitril-valsartan 24-26 MG Commonly known as: ENTRESTO Take 1 tablet by mouth 2 (two) times daily.   senna-docusate 8.6-50 MG tablet Commonly known as: Senokot-S Take 1 tablet by mouth at bedtime.   spironolactone 25 MG tablet Commonly known as: ALDACTONE Take 0.5 tablets (12.5 mg total) by mouth daily.               Durable Medical Equipment  (From admission, onward)           Start     Ordered   11/30/20 1344  Heart failure home health orders  (Heart failure home health orders / Face to face)  Once       Comments: Heart Failure Follow-up Care:  Verify follow-up appointments per Patient Discharge Instructions. Confirm transportation arranged. Reconcile home medications with discharge medication list. Remove discontinued medications from use. Assist patient/caregiver to manage medications using pill box. Reinforce low sodium food selection Assessments: Vital signs and oxygen saturation  at each visit. Assess home environment for safety concerns, caregiver support and availability of low-sodium foods. Consult Education officer, museum, PT/OT, Dietitian, and CNA based on assessments. Perform comprehensive cardiopulmonary assessment. Notify MD for any change in condition or weight gain of 3 pounds  in one day or 5 pounds in one week with symptoms. Daily Weights and Symptom Monitoring: Ensure patient has access to scales. Teach patient/caregiver to weigh daily before breakfast and after voiding using same scale and record.    Teach patient/caregiver to track weight and symptoms and when to notify Provider. Activity: Develop individualized activity plan with patient/caregiver.   Question Answer Comment  Heart Failure Follow-up Care Advanced Heart Failure (AHF) Clinic at [phone]   Obtain the following labs Basic Metabolic Panel   Lab frequency Weekly   Fax lab results to AHF Clinic at [phone]   Diet Low Sodium Heart Healthy   Fluid restrictions: 1500 mL Fluid   Skilled Nurse to notify MD of weight trends weekly for first 2 weeks. May fax or call: AHF Clinic at [phone] (fax) or [phone]      11/30/20 1344   11/29/20 1756  For home use only DME 3 n 1  Once        11/29/20 1755   11/29/20 1754  For home use only DME 4 wheeled rolling walker with seat  Once       Question Answer Comment  Patient needs a walker to treat with the following condition HF (heart failure), systolic ([place])   Patient needs a walker to treat with the following condition History of CVA (cerebrovascular accident)      11/29/20 1755            Disposition   The patient will be discharged in stable condition to SNF --[facility] Discharge Instructions     (HEART FAILURE PATIENTS) Call MD:  Anytime you have any of the following symptoms: 1) 3 pound weight gain in 24 hours or 5 pounds in 1 week 2) shortness of breath, with or without a dry hacking cough 3) swelling in the hands, feet or stomach 4) if you have to sleep on extra pillows at night in order to breathe.   Complete by: As directed    Diet - low sodium heart healthy   Complete by: As directed    Increase activity slowly   Complete by: As directed        Follow-up Information     [facility] [facility] Follow up.   Why: Home Health  RN, Physical Therapy-agency will call to arrange visit Contact information: [place] Follow up on 12/16/2020.   Specialty: Cardiology Why: at 1100 Contact information: [address] [id number] [place] [phone]                  Duration of Discharge Encounter: Greater than 35 minutes   Signed, Darrick Grinder NP-C  12/09/2020, 8:52 AM   Patient seen and examined with the above-signed Advanced Practice Provider and/or Housestaff. I personally reviewed laboratory data, imaging studies and relevant notes. I independently examined the patient and formulated the important aspects of the plan. I have edited the note to reflect any of my changes or salient points. I have personally discussed the plan with the patient and/or family.  She is stable from HF and CAD perspective. See rounding note from today for more details.  Glori Bickers, MD  11:21 AM

## 2020-12-06 NOTE — Progress Notes (Signed)
Occupational Therapy Treatment Patient Details Name: Brittney [name] MRN: [id number] DOB: 05-12-1945 Today's Date: 12/06/2020   History of present illness 75 y.o. female admitted 10/29 with SOB and chest tightness. Pt with multi-vessel CAD with NSTEMI. Not a surgical candidate. Right heart cath 11/3. Code stroke called 11/7 due to slurred speech, MRI (+) Punctate acute infarct in the right anterior parietal lobe, with  additional acute or subacute punctate infarcts in the left frontal  lobe, right parietal lobe, and left cerebellar hemisphere. PMHx: CAD, DM II, DTV, HLD, HTN, and CVA.   OT comments  Brittney [name] is progressing well with reports of feeling "much better" today. Pt completed bed mobility with min guard, and required min A to boost into standing, once standing pt was min guard for short ambulation with RW to the chair. Pt with incontinent BM and required mod A for hygiene with several sit<>stands. Pt continues to have poor activity tolerance and generalized weakness, and required increased time for all functional and cognitive tasks. She benefits from continued OT acutely. D/c plan updated to SNF.    Recommendations for follow up therapy are one component of a multi-disciplinary discharge planning process, led by the attending physician.  Recommendations may be updated based on patient status, additional functional criteria and insurance authorization.    Follow Up Recommendations  Skilled nursing-short term rehab (<3 hours/day)    Assistance Recommended at Discharge Frequent or constant Supervision/Assistance  Equipment Recommendations  Tub/shower bench       Precautions / Restrictions Precautions Precautions: Fall Precaution Comments: watch O2, RR, incontinent Restrictions Weight Bearing Restrictions: No       Mobility Bed Mobility Overal bed mobility: Needs Assistance Bed Mobility: Sit to Supine     Supine to sit: Min guard     General bed mobility comments: min guard  for safety + incrased time and HOB elevated    Transfers Overall transfer level: Needs assistance Equipment used: Rolling walker (2 wheels) Transfers: Sit to/from Stand;Bed to chair/wheelchair/BSC Sit to Stand: Min assist   Step pivot transfers: Min guard       General transfer comment: min A to boost into standing, min guard once stepping with RW     Balance Overall balance assessment: Needs assistance Sitting-balance support: Feet supported Sitting balance-Leahy Scale: Fair Sitting balance - Comments: EOb without physical assist   Standing balance support: Single extremity supported;During functional activity Standing balance-Leahy Scale: Fair                             ADL either performed or assessed with clinical judgement   ADL Overall ADL's : Needs assistance/impaired                         Toilet Transfer: Minimal assistance;Rolling walker (2 wheels);BSC/3in1;Stand-pivot Toilet Transfer Details (indicate cue type and reason): stand pivot from bed>chair wtih inconintent BM Toileting- Clothing Manipulation and Hygiene: Moderate assistance;Sit to/from stand Toileting - Clothing Manipulation Details (indicate cue type and reason): mod A for throughness in standing; pt able to reach back in wipe but unable to reach entire area in standing     Functional mobility during ADLs: Minimal assistance;Rolling walker (2 wheels) General ADL Comments: min A to boost this session for all transfers; pt wtih poor activity tolerance    Extremity/Trunk Assessment Upper Extremity Assessment Upper Extremity Assessment: Generalized weakness   Lower Extremity Assessment Lower Extremity Assessment: Defer to PT evaluation  Vision   Vision Assessment?: No apparent visual deficits   Perception     Praxis      Cognition Arousal/Alertness: Awake/alert Behavior During Therapy: Flat affect Overall Cognitive Status: Impaired/Different from baseline Area  of Impairment: Problem solving                             Problem Solving: Slow processing General Comments: oriented, incrased time for all functional tasks and command following                General Comments VSS on 2L, pt reports she is feeling "much better" after her trip to MRI    Pertinent Vitals/ Pain       Pain Assessment: No/denies pain   Frequency  Min 2X/week        Progress Toward Goals  OT Goals(current goals can now be found in the care plan section)  Progress towards OT goals: Progressing toward goals  Acute Rehab OT Goals Patient Stated Goal: to get stronger OT Goal Formulation: With patient Time For Goal Achievement: 12/15/20 Potential to Achieve Goals: Fair ADL Goals Pt Will Perform Lower Body Bathing: sit to/from stand;with set-up Pt Will Perform Lower Body Dressing: with set-up;sit to/from stand Pt Will Transfer to Toilet: ambulating;bedside commode Pt Will Perform Tub/Shower Transfer: with supervision;tub bench;rolling walker;ambulating Additional ADL Goal #1: Pt will tolerate at least 5 minutes of OOB functional activity in preparation for incrased ability to complete ADLs  Plan Discharge plan needs to be updated       AM-PAC OT "6 Clicks" Daily Activity     Outcome Measure   Help from another person eating meals?: None Help from another person taking care of personal grooming?: A Little Help from another person toileting, which includes using toliet, bedpan, or urinal?: A Little Help from another person bathing (including washing, rinsing, drying)?: A Lot Help from another person to put on and taking off regular upper body clothing?: A Little Help from another person to put on and taking off regular lower body clothing?: A Lot 6 Click Score: 17    End of Session Equipment Utilized During Treatment: Gait belt;Rolling walker (2 wheels);Oxygen  OT Visit Diagnosis: Other abnormalities of gait and mobility (R26.89);Muscle  weakness (generalized) (M62.81);Pain   Activity Tolerance Patient tolerated treatment well   Patient Left in bed;with call bell/phone within reach;with family/visitor present   Nurse Communication Mobility status        Time: 5697-9480 OT Time Calculation (min): 35 min  Charges: OT General Charges $OT Visit: 1 Visit OT Treatments $Self Care/Home Management : 23-37 mins    Josephmichael Lisenbee A Seline Enzor 12/06/2020, 5:26 PM

## 2020-12-06 NOTE — Progress Notes (Addendum)
Advanced Heart Failure Rounding Note  PCP-Cardiologist: None   Subjective:    TEE 11/4: EF 35-40% 2-3+ MR (moderate)  IV lasix transitioned to po torsemide 11/06.  CVP 2 sitting up at 65 degrees. Co-ox 66%.  Scr stable 1.16. -1L yesterday. Weight up 2 lb.  Still very weak. Able to transfer with assistance but did not walk with PT yesterday d/t fatigue.   Remains SOB with little activity.  BP okay.   Objective:   Weight Range: 91.2 kg Body mass index is 31.49 kg/m.   Vital Signs:   Temp:  [97.2 F (36.2 C)-98.8 F (37.1 C)] 98.1 F (36.7 C) (11/07 0732) Pulse Rate:  [74-92] 76 (11/07 0732) Resp:  [10-20] 16 (11/07 0732) BP: (129-143)/(63-70) 143/64 (11/07 0732) SpO2:  [65 %-100 %] 65 % (11/07 0732) Weight:  [91.2 kg] 91.2 kg (11/07 0433) Last BM Date: 12/06/20  Weight change: Filed Weights   12/04/20 0650 12/05/20 0617 12/06/20 0433  Weight: 90.3 kg 90.4 kg 91.2 kg    Intake/Output:   Intake/Output Summary (Last 24 hours) at 12/06/2020 0811 Last data filed at 12/06/2020 0500 Gross per 24 hour  Intake 550 ml  Output 1700 ml  Net -1150 ml      Physical Exam  CVP 2 today sitting up in bed General:  Fatigued appearing. No distress HEENT: normal Neck: supple. no JVD. Carotids 2+ bilat; no bruits. No lymphadenopathy or thryomegaly appreciated. Cor: PMI nondisplaced. Regular rate & rhythm. No rubs, gallops + MR murmur Lungs: clear Abdomen: soft, nontender, nondistended. No hepatosplenomegaly. No bruits or masses. Good bowel sounds. Extremities: no cyanosis, clubbing, rash, edema Neuro: alert & orientedx3, cranial nerves grossly intact. moves all 4 extremities w/o difficulty. Affect pleasant   Telemetry   SR 70s (personally reviewed)  Labs    CBC Recent Labs    12/05/20 0518 12/06/20 0415  WBC 9.5 8.6  HGB 9.8* 9.4*  HCT 30.4* 29.6*  MCV 89.4 90.2  PLT 215 465   Basic Metabolic Panel Recent Labs    12/05/20 0518 12/06/20 0415  NA  133* 132*  K 3.7 3.6  CL 90* 89*  CO2 34* 35*  GLUCOSE 108* 160*  BUN 29* 25*  CREATININE 1.15* 1.16*  CALCIUM 8.8* 8.6*  MG 2.3 2.3   Liver Function Tests No results for input(s): AST, ALT, ALKPHOS, BILITOT, PROT, ALBUMIN in the last 72 hours.  No results for input(s): LIPASE, AMYLASE in the last 72 hours. Cardiac Enzymes No results for input(s): CKTOTAL, CKMB, CKMBINDEX, TROPONINI in the last 72 hours.  BNP: BNP (last 3 results) No results for input(s): BNP in the last 8760 hours.  ProBNP (last 3 results) No results for input(s): PROBNP in the last 8760 hours.   D-Dimer No results for input(s): DDIMER in the last 72 hours. Hemoglobin A1C No results for input(s): HGBA1C in the last 72 hours.  Fasting Lipid Panel No results for input(s): CHOL, HDL, LDLCALC, TRIG, CHOLHDL, LDLDIRECT in the last 72 hours.  Thyroid Function Tests No results for input(s): TSH, T4TOTAL, T3FREE, THYROIDAB in the last 72 hours.  Invalid input(s): FREET3  Other results:   Imaging    No results found.   Medications:     Scheduled Medications:  aspirin  81 mg Oral Daily   atorvastatin  80 mg Oral Daily   Chlorhexidine Gluconate Cloth  6 each Topical Daily   clopidogrel  75 mg Oral Daily   enoxaparin (LOVENOX) injection  40 mg Subcutaneous Q24H  glycerin (Pediatric)  2 suppository Rectal Once   insulin aspart  0-15 Units Subcutaneous TID WC   insulin aspart  0-5 Units Subcutaneous QHS   mouth rinse  15 mL Mouth Rinse BID   senna-docusate  1 tablet Oral QHS   sodium chloride flush  10-40 mL Intracatheter Q12H   sorbitol  30 mL Oral Once   spironolactone  12.5 mg Oral Daily   torsemide  40 mg Oral Daily    Infusions:    PRN Medications: acetaminophen, ondansetron (ZOFRAN) IV    Patient Profile   75 y.o. female with history of CAD s/p prior MI in 2004 (details not known), PAD, DM, HTN, HLD. Now admitted with NSTEMI c/b acute systolic HF.  Assessment/Plan   Acute  systolic HF: -2/2 NSTEMI - Echo LVEF 35-40%, RV okay - RHC 12/02/20  elevated filling pressures with very prominent v-waves in PCW tracing suggestive of significant MR (likely ischemic) - TEE 12/03/20 EF 35-40% moderate MR - has diuresed well. CVP 2 . Co-ox 66% - Volume status appears low on po torsemide 40 mg daily. Hold torsemide today.  - Midodrine stopped 11/5. BP stable  - Continue spiro 12.5 mg daily - Ordered entresto 24/26 mg BID to start tomorrow am. - No b-blocker or SGLT2i yet  - Supp k. 3.6 today.   2. NSTEMI: -Known history of CAD with prior MI in 2004 in [place] (details not known) -Severe diffuse 3 V CAD on LHC this admit.  - Cath films reviewed with Dr. Ali Lowe. PCI of ostial Lcx would be very high-risk with potential plaque shift into LM and LAD. Functional status significantly limited at baseline. Medical therapy only option at this time.  -HS troponin up to 1,249 -ASA + plavix -Continue Atorvastatin 80. LDL 60 - No s/s angina    3. Type II DM: -A1c 7.3 -Consider Farxiga prior to D/C -SSI   4. PAD: -Hx Viabahn covered stent left SFA, stent left TP trunk, PTA left posterior tibial artery 09/20 d/t CLI - ASA + statin   5. Anemia: - Stable  - hgb 9.4 -Iron stores okay -Continue to monitor -No obvious source of bleeding  6. Acute gout -Uric acid 9.3 -Right ankle pain. Has finished prednisone   7. Morbid obesity - Body mass index is 31.49 kg/m. - weight improving with diuresis  8. Physical deconditioning - spoke with nursing staff. She is very weak.  - PT recommending SNF. Consult TOC/SW  9. DNR/DNI - has met with Palliative Care. Code status updated.   Length of Stay: 9  FINCH, [place], PA-C  12/06/2020, 8:11 AM  Advanced Heart Failure Team Pager [phone] (M-F; 7a - 5p)  Please contact [facility] for night-coverage after hours (5p -7a ) and weekends on [email or web address]  Patient seen and examined with the above-signed Advanced Practice  Provider and/or Housestaff. I personally reviewed laboratory data, imaging studies and relevant notes. I independently examined the patient and formulated the important aspects of the plan. I have edited the note to reflect any of my changes or salient points. I have personally discussed the plan with the patient and/or family.  Remains weak and SOB with minimal activity. CVP and co-ox ok. No CP, orthopnea or PND.   General:  Weak appearing. No resp difficulty HEENT: normal Neck: supple. no JVD. Carotids 2+ bilat; no bruits. No lymphadenopathy or thryomegaly appreciated. Cor: PMI nondisplaced. Regular rate & rhythm. No rubs, gallops or murmurs. Lungs: clear Abdomen: obese soft, nontender, nondistended. No  hepatosplenomegaly. No bruits or masses. Good bowel sounds. Extremities: no cyanosis, clubbing, rash, edema Neuro: alert & orientedx3, cranial nerves grossly intact. moves all 4 extremities w/o difficulty. Affect pleasant  Volume status and output stable. No angina. Main issue remains severe deconditioning. Await SNF placement.   Glori Bickers, MD  8:59 AM

## 2020-12-07 ENCOUNTER — Inpatient Hospital Stay (HOSPITAL_COMMUNITY): Payer: Medicare Other

## 2020-12-07 ENCOUNTER — Other Ambulatory Visit (HOSPITAL_COMMUNITY): Payer: Self-pay

## 2020-12-07 DIAGNOSIS — I214 Non-ST elevation (NSTEMI) myocardial infarction: Secondary | ICD-10-CM | POA: Diagnosis not present

## 2020-12-07 DIAGNOSIS — I5021 Acute systolic (congestive) heart failure: Secondary | ICD-10-CM | POA: Diagnosis not present

## 2020-12-07 LAB — BASIC METABOLIC PANEL
Anion gap: 8 (ref 5–15)
BUN: 23 mg/dL (ref 8–23)
CO2: 33 mmol/L — ABNORMAL HIGH (ref 22–32)
Calcium: 8.7 mg/dL — ABNORMAL LOW (ref 8.9–10.3)
Chloride: 90 mmol/L — ABNORMAL LOW (ref 98–111)
Creatinine, Ser: 1.16 mg/dL — ABNORMAL HIGH (ref 0.44–1.00)
GFR, Estimated: 49 mL/min — ABNORMAL LOW (ref >60)
Glucose, Bld: 160 mg/dL — ABNORMAL HIGH (ref 70–99)
Potassium: 3.5 mmol/L (ref 3.5–5.1)
Sodium: 131 mmol/L — ABNORMAL LOW (ref 135–145)

## 2020-12-07 LAB — GLUCOSE, CAPILLARY
Glucose-Capillary: 152 mg/dL — ABNORMAL HIGH (ref 70–99)
Glucose-Capillary: 142 mg/dL — ABNORMAL HIGH (ref 70–99)
Glucose-Capillary: 150 mg/dL — ABNORMAL HIGH (ref 70–99)
Glucose-Capillary: 207 mg/dL — ABNORMAL HIGH (ref 70–99)
Glucose-Capillary: 225 mg/dL — ABNORMAL HIGH (ref 70–99)

## 2020-12-07 LAB — LIPID PANEL
Cholesterol: 122 mg/dL (ref 0–200)
HDL: 52 mg/dL (ref >40)
LDL Cholesterol: 57 mg/dL (ref 0–99)
Total CHOL/HDL Ratio: 2.3 RATIO
Triglycerides: 66 mg/dL (ref <150)
VLDL: 13 mg/dL (ref 0–40)

## 2020-12-07 LAB — HEPARIN LEVEL (UNFRACTIONATED): Heparin Unfractionated: 0.28 IU/mL — ABNORMAL LOW (ref 0.30–0.70)

## 2020-12-07 LAB — MAGNESIUM: Magnesium: 2.2 mg/dL (ref 1.7–2.4)

## 2020-12-07 LAB — CBC
HCT: 29.1 % — ABNORMAL LOW (ref 36.0–46.0)
Hemoglobin: 9.5 g/dL — ABNORMAL LOW (ref 12.0–15.0)
MCH: 29.3 pg (ref 26.0–34.0)
MCHC: 32.6 g/dL (ref 30.0–36.0)
MCV: 89.8 fL (ref 80.0–100.0)
Platelets: 226 10*3/uL (ref 150–400)
RBC: 3.24 MIL/uL — ABNORMAL LOW (ref 3.87–5.11)
RDW: 14.3 % (ref 11.5–15.5)
WBC: 8.9 10*3/uL (ref 4.0–10.5)
nRBC: 0 % (ref 0.0–0.2)

## 2020-12-07 LAB — HEMOGLOBIN A1C
Hgb A1c MFr Bld: 7.7 % — ABNORMAL HIGH (ref 4.8–5.6)
Mean Plasma Glucose: 174.29 mg/dL

## 2020-12-07 MED ORDER — POTASSIUM CHLORIDE 20 MEQ PO PACK
40.0000 meq | PACK | Freq: Once | ORAL | Status: AC
  Administered 2020-12-07: 40 meq via ORAL
  Filled 2020-12-07: qty 2

## 2020-12-07 MED ORDER — POTASSIUM CHLORIDE CRYS ER 20 MEQ PO TBCR
40.0000 meq | EXTENDED_RELEASE_TABLET | Freq: Once | ORAL | Status: DC
Start: 2020-12-07 — End: 2020-12-07

## 2020-12-07 MED ORDER — TORSEMIDE 20 MG PO TABS
20.0000 mg | ORAL_TABLET | Freq: Every day | ORAL | Status: DC
  Administered 2020-12-07 – 2020-12-08 (×2): 20 mg via ORAL
  Filled 2020-12-07 (×2): qty 1

## 2020-12-07 MED ORDER — ATORVASTATIN CALCIUM 40 MG PO TABS
40.0000 mg | ORAL_TABLET | Freq: Every day | ORAL | Status: DC
  Administered 2020-12-08 – 2020-12-09 (×2): 40 mg via ORAL
  Filled 2020-12-07 (×2): qty 1

## 2020-12-07 MED ORDER — HEPARIN (PORCINE) 25000 UT/250ML-% IV SOLN
400.0000 [IU]/h | INTRAVENOUS | Status: DC
  Administered 2020-12-07: 600 [IU]/h via INTRAVENOUS
  Filled 2020-12-07: qty 250

## 2020-12-07 NOTE — Plan of Care (Signed)
Discussed with patient plan of care for the evening, pain management and stroke assessments with some teach back displayed  Problem: Education: Goal: Knowledge of General Education information will improve Description: Including pain rating scale, medication(s)/side effects and non-pharmacologic comfort measures Outcome: Progressing   Problem: Health Behavior/Discharge Planning: Goal: Ability to manage health-related needs will improve Outcome: Progressing

## 2020-12-07 NOTE — Progress Notes (Signed)
Advanced Heart Failure Rounding Note  PCP-Cardiologist: None   Subjective:    TEE 11/4: EF 35-40% 2-3+ MR (moderate)  Had brain MRI yesterday due to stuttering. Multiple small punctate lesions. Stuttering has resolved  Breathing better. Feels fatigued. No orthopnea or PND  CVP 4-5  Objective:   Weight Range: 91.2 kg Body mass index is 31.49 kg/m.   Vital Signs:   Temp:  [97.8 F (36.6 C)-98.5 F (36.9 C)] 98.2 F (36.8 C) (11/08 0340) Pulse Rate:  [73-80] 73 (11/08 0400) Resp:  [11-22] 17 (11/08 0400) BP: (128-143)/(61-76) 131/65 (11/08 0340) SpO2:  [65 %-100 %] 100 % (11/08 0400) Weight:  [91.2 kg] 91.2 kg (11/08 0339) Last BM Date: 12/06/20  Weight change: Filed Weights   12/05/20 0617 12/06/20 0433 12/07/20 0339  Weight: 90.4 kg 91.2 kg 91.2 kg    Intake/Output:   Intake/Output Summary (Last 24 hours) at 12/07/2020 0537 Last data filed at 12/07/2020 0300 Gross per 24 hour  Intake 443 ml  Output 800 ml  Net -357 ml       Physical Exam   General:  Fatigued appearing. No distress HEENT: normal Neck: supple. no JVD. Carotids 2+ bilat; no bruits. No lymphadenopathy or thryomegaly appreciated. Cor: PMI nondisplaced. Regular rate & rhythm. No rubs, gallops or murmurs. Lungs: clear Abdomen: obese soft, nontender, nondistended. No hepatosplenomegaly. No bruits or masses. Good bowel sounds. Extremities: no cyanosis, clubbing, rash, edema Neuro: alert & orientedx3, cranial nerves grossly intact. moves all 4 extremities w/o difficulty. Affect pleasant   Telemetry   SR 70-80s (personally reviewed)  Labs    CBC Recent Labs    12/06/20 0415 12/07/20 0345  WBC 8.6 8.9  HGB 9.4* 9.5*  HCT 29.6* 29.1*  MCV 90.2 89.8  PLT 230 893    Basic Metabolic Panel Recent Labs    12/06/20 0415 12/07/20 0345  NA 132* 131*  K 3.6 3.5  CL 89* 90*  CO2 35* 33*  GLUCOSE 160* 160*  BUN 25* 23  CREATININE 1.16* 1.16*  CALCIUM 8.6* 8.7*  MG 2.3 2.2     Liver Function Tests No results for input(s): AST, ALT, ALKPHOS, BILITOT, PROT, ALBUMIN in the last 72 hours.  No results for input(s): LIPASE, AMYLASE in the last 72 hours. Cardiac Enzymes No results for input(s): CKTOTAL, CKMB, CKMBINDEX, TROPONINI in the last 72 hours.  BNP: BNP (last 3 results) No results for input(s): BNP in the last 8760 hours.  ProBNP (last 3 results) No results for input(s): PROBNP in the last 8760 hours.   D-Dimer No results for input(s): DDIMER in the last 72 hours. Hemoglobin A1C No results for input(s): HGBA1C in the last 72 hours.  Fasting Lipid Panel No results for input(s): CHOL, HDL, LDLCALC, TRIG, CHOLHDL, LDLDIRECT in the last 72 hours.  Thyroid Function Tests No results for input(s): TSH, T4TOTAL, T3FREE, THYROIDAB in the last 72 hours.  Invalid input(s): FREET3  Other results:   Imaging    MR BRAIN WO CONTRAST  Result Date: 12/06/2020 CLINICAL DATA:  Neuro deficit, stroke suspected EXAM: MRI HEAD WITHOUT CONTRAST TECHNIQUE: Multiplanar, multiecho pulse sequences of the brain and surrounding structures were obtained without intravenous contrast. COMPARISON:  01/02/2012 FINDINGS: Brain: Focus of restricted diffusion with ADC correlate in the right anterior parietal lobe (series 2, image 33-37). Additional less intense foci of restricted diffusion with possible ADC correlates are also seen in the left frontal (series 2, image 33) and right parietal lobes (series 2, image 34),  left inferior cerebellar peduncle (series 2, image 14), and left cerebellum (series 2, images 12 and 16). These are not associated with definite increased T2 signal, although evaluation on the FLAIR is limited by motion artifact; no increased T2 signal is seen on the FLAIR propeller (less motion sensitive) sequence. No acute hemorrhage, mass, mass effect, or midline shift. No hydrocephalus or extra-axial collection. Confluent T2 hyperintense signal in the  periventricular white matter, likely the sequela of severe chronic small vessel ischemic disease. Vascular: Normal flow voids. Skull and upper cervical spine: Normal marrow signal. Sinuses/Orbits: Negative.  Status post bilateral lens replacements. Other: None. IMPRESSION: Punctate acute infarct in the right anterior parietal lobe, with additional acute or subacute punctate infarcts in the left frontal lobe, right parietal lobe, and left cerebellar hemisphere. Given different vascular territories, embolic etiology is suspected. These results were called by telephone at the time of interpretation on 12/06/2020 at 1:35 pm to provider [facility], who verbally acknowledged these results. Electronically Signed   By: Merilyn Baba M.D.   On: 12/06/2020 13:36     Medications:     Scheduled Medications:  aspirin  81 mg Oral Daily   atorvastatin  80 mg Oral Daily   Chlorhexidine Gluconate Cloth  6 each Topical Daily   clopidogrel  75 mg Oral Daily   enoxaparin (LOVENOX) injection  40 mg Subcutaneous Q24H   glycerin (Pediatric)  2 suppository Rectal Once   insulin aspart  0-15 Units Subcutaneous TID WC   insulin aspart  0-5 Units Subcutaneous QHS   mouth rinse  15 mL Mouth Rinse BID   sacubitril-valsartan  1 tablet Oral BID   senna-docusate  1 tablet Oral QHS   sodium chloride flush  10-40 mL Intracatheter Q12H   sorbitol  30 mL Oral Once   spironolactone  12.5 mg Oral Daily    Infusions:    PRN Medications: acetaminophen, ondansetron (ZOFRAN) IV    Patient Profile   75 y.o. female with history of CAD s/p prior MI in 2004 (details not known), PAD, DM, HTN, HLD. Now admitted with NSTEMI c/b acute systolic HF.  Assessment/Plan   Acute systolic HF: -2/2 NSTEMI - Echo LVEF 35-40%, RV okay - RHC 12/02/20  elevated filling pressures with very prominent v-waves in PCW tracing suggestive of significant MR (likely ischemic) - TEE 12/03/20 EF 35-40% moderate MR - has diuresed well. CVP 4-5 .  Co-ox pending - Volume status ok. Restart torsemide 20 daily - Midodrine stopped 11/5. BP stable  - Continue spiro 12.5 mg daily - Start Entresto 24/26 bid. Keep SBP > 110  - No b-blocker or SGLT2i yet  - Supp k. 3.5 today.   2. NSTEMI: -Known history of CAD with prior MI in 2004 in [place] (details not known) -Severe diffuse 3 V CAD on LHC this admit.  - Cath films reviewed with Dr. Ali Lowe. PCI of ostial Lcx would be very high-risk with potential plaque shift into LM and LAD. Functional status significantly limited at baseline. Medical therapy only option at this time.  -HS troponin up to 1,249 -ASA + plavix -Continue Atorvastatin 80. LDL 60 - No s/s angina   3. 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 No presence of AF on monitor - d/w Neuro. Will give heparin low-dose x 24 hours then switch to DOAC - continue statin - PT/OT - carotid u/s pending    4. PAD: -Hx Viabahn covered stent left SFA, stent left TP trunk, PTA left posterior tibial artery 09/20 d/t CLI -  ASA + statin   5. Anemia: - Stable  - hgb 9.5 -Iron stores okay -Continue to monitor -No obvious source of bleeding  6. Acute gout -Uric acid 9.3 -Right ankle pain. Has finished prednisone   7. Morbid obesity - Body mass index is 31.49 kg/m. - weight improving with diuresis  8. Physical deconditioning - spoke with nursing staff. She is very weak.  - PT recommending SNF. Consult TOC/SW  9. Type II DM: -A1c 7.3 -Consider Farxiga prior to D/C -SSI  10. DNR/DNI - has met with Palliative Care. Code status updated.   Length of Stay: [place], MD  12/07/2020, 5:37 AM  Advanced Heart Failure Team Pager [phone] (M-F; 7a - 5p)  Please contact [facility] for night-coverage after hours (5p -7a ) and weekends on [email or web address]

## 2020-12-07 NOTE — Evaluation (Signed)
Clinical/Bedside Swallow Evaluation Patient Details  Name: Brittney [name] MRN: [id number] Date of Birth: July 07, 1945  Today's Date: 12/07/2020 Time: SLP Start Time (ACUTE ONLY): 1518 SLP Stop Time (ACUTE ONLY): 1530 SLP Time Calculation (min) (ACUTE ONLY): 12 min  Past Medical History:  Past Medical History:  Diagnosis Date   Arthritis    Coronary artery disease    Diabetes mellitus without complication (HCC)    DVT (deep venous thrombosis) (HCC)    Heart attack (HCC)    Hyperlipidemia    Hypertension    Stroke [facility])    Past Surgical History:  Past Surgical History:  Procedure Laterality Date   ABDOMINAL AORTOGRAM W/LOWER EXTREMITY Bilateral 10/28/2018   Procedure: ABDOMINAL AORTOGRAM W/LOWER EXTREMITY;  Surgeon: Maeola Harman, MD;  [facility] INVASIVE CV LAB;  Service: Cardiovascular;  Laterality: Bilateral;   CATARACT EXTRACTION     CESAREAN SECTION     CORONARY ANGIOPLASTY WITH STENT PLACEMENT     LEFT HEART CATH AND CORONARY ANGIOGRAPHY N/A 11/27/2020   Procedure: LEFT HEART CATH AND CORONARY ANGIOGRAPHY;  Surgeon: Orbie Pyo, MD;  Location: [facility] INVASIVE CV LAB;  Service: Cardiovascular;  Laterality: N/A;   PERIPHERAL VASCULAR INTERVENTION  10/28/2018   Procedure: PERIPHERAL VASCULAR INTERVENTION;  Surgeon: Maeola Harman, MD;  Location: [facility] INVASIVE CV LAB;  Service: Cardiovascular;;   RIGHT HEART CATH N/A 12/02/2020   Procedure: RIGHT HEART CATH;  Surgeon: Dolores Patty, MD;  Location: [facility] INVASIVE CV LAB;  Service: Cardiovascular;  Laterality: N/A;   TEE WITHOUT CARDIOVERSION N/A 12/03/2020   Procedure: TRANSESOPHAGEAL ECHOCARDIOGRAM (TEE);  Surgeon: Dolores Patty, MD;  Location: [facility] ENDOSCOPY;  Service: Cardiovascular;  Laterality: N/A;   HPI:  75 y.o. female admitted 10/29 with SOB and chest tightness. Pt with multi-vessel CAD with NSTEMI. Not a surgical candidate. Right heart cath 11/3. Code stroke called 11/7 due to slurred speech,  MRI (+) Punctate acute infarct in the right anterior parietal lobe, with  additional acute or subacute punctate infarcts in the left frontal  lobe, right parietal lobe, and left cerebellar hemisphere. PMHx: CAD, DM II, DTV, HLD, HTN, and CVA.    Assessment / Plan / Recommendation  Clinical Impression  Pt presents with normal oropharyngeal swallow function with thorough mastication, brisk swallow, no s/sx of aspiration.  No dysphagia after newly dx stroke.  Continue regular solids, thin liquids.  Meds whole in liquids. No SLP f/u needed. Our service will sign off. SLP Visit Diagnosis: Dysphagia, unspecified (R13.10)    Aspiration Risk  No limitations    Diet Recommendation     Medication Administration: Whole meds with liquid    Other  Recommendations Oral Care Recommendations: Oral care BID    Recommendations for follow up therapy are one component of a multi-disciplinary discharge planning process, led by the attending physician.  Recommendations may be updated based on patient status, additional functional criteria and insurance authorization.  Follow up Recommendations No SLP follow up        Swallow Study   General Date of Onset: 12/07/20 HPI: 75 y.o. female admitted 10/29 with SOB and chest tightness. Pt with multi-vessel CAD with NSTEMI. Not a surgical candidate. Right heart cath 11/3. Code stroke called 11/7 due to slurred speech, MRI (+) Punctate acute infarct in the right anterior parietal lobe, with  additional acute or subacute punctate infarcts in the left frontal  lobe, right parietal lobe, and left cerebellar hemisphere. PMHx: CAD, DM II, DTV, HLD, HTN, and CVA. Type of Study:  Bedside Swallow Evaluation Previous Swallow Assessment: no Diet Prior to this Study: Regular;Thin liquids Temperature Spikes Noted: No Respiratory Status: Nasal cannula Behavior/Cognition: Alert;Cooperative Oral Cavity Assessment: Within Functional Limits Oral Care Completed by SLP: No Oral  Cavity - Dentition: Dentures, top Vision: Functional for self-feeding Self-Feeding Abilities: Able to feed self Patient Positioning: Upright in chair Baseline Vocal Quality: Normal Volitional Cough: Strong Volitional Swallow: Able to elicit    Oral/Motor/Sensory Function Overall Oral Motor/Sensory Function: Within functional limits   Ice Chips Ice chips: Within functional limits   Thin Liquid Thin Liquid: Within functional limits    Nectar Thick Nectar Thick Liquid: Not tested   Honey Thick Honey Thick Liquid: Not tested   Puree Puree: Within functional limits   Solid     Solid: Within functional limits      Blenda Mounts Laurice 12/07/2020,3:32 PM Marchelle Folks L. Samson Frederic, MA CCC/SLP Acute Rehabilitation Services Office number [phone] Pager [phone]

## 2020-12-07 NOTE — Progress Notes (Signed)
Carotid duplex and tcd has been completed.   Preliminary results in CV Proc.   Aundra Millet Kemi Gell 12/07/2020 9:37 AM

## 2020-12-07 NOTE — Progress Notes (Addendum)
STROKE TEAM PROGRESS NOTE   INTERVAL HISTORY Her granddaughter is in the room at time of exam. Patient complains of hunger at this time.   She states her speech is improving but is not back to baseline.  MRI scan of the brain shows mild bilateral infarcts in different vascular distributions.  Carotid ultrasound and transplant Doppler studies are pending.  Patient does admit to having sleeping difficulties and snoring but she has not been checked for sleep apnea.  She is interested in participating the sleep smart study Vitals:   12/07/20 0500 12/07/20 0600 12/07/20 0610 12/07/20 0716  BP:    (!) 130/57  Pulse: 79 74 74 75  Resp: 16 11 17 10   Temp:    98 F (36.7 C)  TempSrc:    Oral  SpO2: 98% 99% 98% 99%  Weight:      Height:       CBC:  Recent Labs  Lab 12/06/20 0415 12/07/20 0345  WBC 8.6 8.9  HGB 9.4* 9.5*  HCT 29.6* 29.1*  MCV 90.2 89.8  PLT 230 [id number]   Basic Metabolic Panel:  Recent Labs  Lab 12/06/20 0415 12/07/20 0345  NA 132* 131*  K 3.6 3.5  CL 89* 90*  CO2 35* 33*  GLUCOSE 160* 160*  BUN 25* 23  CREATININE 1.16* 1.16*  CALCIUM 8.6* 8.7*  MG 2.3 2.2    Lipid Panel:  Recent Labs  Lab 12/07/20 0345  CHOL 122  TRIG 66  HDL 52  CHOLHDL 2.3  VLDL 13  LDLCALC 57    HgbA1c:  Recent Labs  Lab 12/07/20 0345  HGBA1C 7.7*   Urine Drug Screen: No results for input(s): LABOPIA, COCAINSCRNUR, LABBENZ, AMPHETMU, THCU, LABBARB in the last 168 hours.  Alcohol Level No results for input(s): ETH in the last 168 hours.  IMAGING past 24 hours MR BRAIN WO CONTRAST  Result Date: 12/06/2020 CLINICAL DATA:  Neuro deficit, stroke suspected EXAM: MRI HEAD WITHOUT CONTRAST TECHNIQUE: Multiplanar, multiecho pulse sequences of the brain and surrounding structures were obtained without intravenous contrast. COMPARISON:  01/02/2012 FINDINGS: Brain: Focus of restricted diffusion with ADC correlate in the right anterior parietal lobe (series 2, image 33-37). Additional less  intense foci of restricted diffusion with possible ADC correlates are also seen in the left frontal (series 2, image 33) and right parietal lobes (series 2, image 34), left inferior cerebellar peduncle (series 2, image 14), and left cerebellum (series 2, images 12 and 16). These are not associated with definite increased T2 signal, although evaluation on the FLAIR is limited by motion artifact; no increased T2 signal is seen on the FLAIR propeller (less motion sensitive) sequence. No acute hemorrhage, mass, mass effect, or midline shift. No hydrocephalus or extra-axial collection. Confluent T2 hyperintense signal in the periventricular white matter, likely the sequela of severe chronic small vessel ischemic disease. Vascular: Normal flow voids. Skull and upper cervical spine: Normal marrow signal. Sinuses/Orbits: Negative.  Status post bilateral lens replacements. Other: None. IMPRESSION: Punctate acute infarct in the right anterior parietal lobe, with additional acute or subacute punctate infarcts in the left frontal lobe, right parietal lobe, and left cerebellar hemisphere. Given different vascular territories, embolic etiology is suspected. These results were called by telephone at the time of interpretation on 12/06/2020 at 1:35 pm to provider [facility], who verbally acknowledged these results. Electronically Signed   By: Merilyn Baba M.D.   On: 12/06/2020 13:36   VAS US CAROTID  Result Date: 12/07/2020 Carotid Arterial Duplex  Study Patient Name:  Brittney [name]  Date of Exam:   12/07/2020 Medical Rec #: [id number]       Accession #:    [id number] Date of Birth: 08/06/1945      Patient Gender: F Patient Age:   75 years Exam Location:  [facility] Procedure:      VAS US CAROTID Referring Phys: Lesleigh Noe --------------------------------------------------------------------------------  Indications:       CVA. Risk Factors:      Hypertension, hyperlipidemia, Diabetes, prior CVA. Comparison Study:   no prior Performing Technologist: Archie Patten RVS  Examination Guidelines: A complete evaluation includes B-mode imaging, spectral Doppler, color Doppler, and power Doppler as needed of all accessible portions of each vessel. Bilateral testing is considered an integral part of a complete examination. Limited examinations for reoccurring indications may be performed as noted.  Right Carotid Findings: +----------+--------+--------+--------+------------------+--------+           PSV cm/sEDV cm/sStenosisPlaque DescriptionComments +----------+--------+--------+--------+------------------+--------+ CCA Prox  68      10              heterogenous               +----------+--------+--------+--------+------------------+--------+ CCA Distal85      15              heterogenous               +----------+--------+--------+--------+------------------+--------+ ICA Prox  88      27      1-39%   heterogenous               +----------+--------+--------+--------+------------------+--------+ ICA Distal54      17                                         +----------+--------+--------+--------+------------------+--------+ ECA       125                                                +----------+--------+--------+--------+------------------+--------+ +----------+--------+-------+--------+-------------------+           PSV cm/sEDV cmsDescribeArm Pressure (mmHG) +----------+--------+-------+--------+-------------------+ Subclavian120                                        +----------+--------+-------+--------+-------------------+ +---------+--------+--+--------+--+---------+ VertebralPSV cm/s54EDV cm/s17Antegrade +---------+--------+--+--------+--+---------+  Left Carotid Findings: +----------+--------+--------+--------+------------------+--------+           PSV cm/sEDV cm/sStenosisPlaque DescriptionComments  +----------+--------+--------+--------+------------------+--------+ CCA Prox  102                     heterogenous               +----------+--------+--------+--------+------------------+--------+ CCA Distal68      8               heterogenous               +----------+--------+--------+--------+------------------+--------+ ICA Prox  31      10      1-39%   heterogenous               +----------+--------+--------+--------+------------------+--------+ ICA Distal37      11                                         +----------+--------+--------+--------+------------------+--------+  ECA       246             >50%                               +----------+--------+--------+--------+------------------+--------+ +----------+--------+--------+--------+-------------------+           PSV cm/sEDV cm/sDescribeArm Pressure (mmHG) +----------+--------+--------+--------+-------------------+ Subclavian105                                         +----------+--------+--------+--------+-------------------+ +---------+--------+--+--------+--+---------+ VertebralPSV cm/s52EDV cm/s11Antegrade +---------+--------+--+--------+--+---------+   Summary: Right Carotid: Velocities in the right ICA are consistent with a 1-39% stenosis. Left Carotid: Velocities in the left ICA are consistent with a 1-39% stenosis.               The ECA appears >50% stenosed. Vertebrals: Bilateral vertebral arteries demonstrate antegrade flow. *See table(s) above for measurements and observations.     Preliminary    VAS [place] TRANSCRANIAL DOPPLER  Result Date: 12/07/2020  Transcranial Doppler Patient Name:  VENIE GUNNELL  Date of Exam:   12/07/2020 Medical Rec #: [id number]       Accession #:    [id number] Date of Birth: [birth year]      Patient Gender: F Patient Age:   89 years Exam Location:  [facility] Procedure:      VAS [place] TRANSCRANIAL DOPPLER Referring Phys: Vestal Markin  --------------------------------------------------------------------------------  Indications: Stroke. Performing Technologist: Archie Patten RVS  Examination Guidelines: A complete evaluation includes B-mode imaging, spectral Doppler, color Doppler, and power Doppler as needed of all accessible portions of each vessel. Bilateral testing is considered an integral part of a complete examination. Limited examinations for reoccurring indications may be performed as noted.  +----------+-------------+----------+-----------+------------------+ RIGHT TCD Right VM (cm)Depth (cm)Pulsatility     Comment       +----------+-------------+----------+-----------+------------------+ MCA           47.00                 1.21                       +----------+-------------+----------+-----------+------------------+ ACA                                         unable to insonate +----------+-------------+----------+-----------+------------------+ Term ICA      61.00                 1.11                       +----------+-------------+----------+-----------+------------------+ PCA           26.00                 1.35                       +----------+-------------+----------+-----------+------------------+ Opthalmic     14.00                 1.65                       +----------+-------------+----------+-----------+------------------+ ICA siphon  unable to insonate +----------+-------------+----------+-----------+------------------+ Vertebral                                   unable to insonate +----------+-------------+----------+-----------+------------------+  +----------+------------+----------+-----------+------------------+ LEFT TCD  Left VM (cm)Depth (cm)Pulsatility     Comment       +----------+------------+----------+-----------+------------------+ MCA          70.00                 1.38                        +----------+------------+----------+-----------+------------------+ ACA                                        unable to insonate +----------+------------+----------+-----------+------------------+ Term ICA     62.00                 1.68                       +----------+------------+----------+-----------+------------------+ PCA          42.00                 1.07                       +----------+------------+----------+-----------+------------------+ Opthalmic    11.00                 0.89                       +----------+------------+----------+-----------+------------------+ ICA siphon                                 unable to insonate +----------+------------+----------+-----------+------------------+ Vertebral                                  unable to insonate +----------+------------+----------+-----------+------------------+  +------------+-----+------------------+             VM cm     Comment       +------------+-----+------------------+ Prox Basilar     unable to insonate +------------+-----+------------------+ Dist Basilar     unable to insonate +------------+-----+------------------+    Preliminary     PHYSICAL EXAM Obese elderly African-American lady not in distress. . Afebrile. Head is nontraumatic. Neck is supple without bruit.    Cardiac exam no murmur or gallop. Lungs are clear to auscultation. Distal pulses are well felt.  Neurological Exam ;  Awake  Alert oriented x 3.  Slightly hesitant nonfluent speech and mild dysarthria.eye movements full without nystagmus.fundi were not visualized. Vision acuity and fields appear normal. Hearing is normal. Palatal movements are normal. Face symmetric. Tongue midline. Normal strength, tone, reflexes and coordination. Normal sensation. Gait deferred.  NIHSS 2.  Premorbid modified Rankin score 0 ASSESSMENT/PLAN Ms. Brittney CordsJulia [name] is a 75 y.o. female with history of   coronary artery disease, type 2  diabetes mellitus, hyperlipidemia, DVT, essential hypertension, remote MI s/p stent placement, and peripheral arterial disease who presented to the ED 10/29 for evaluation of 3-4 days of progressive dyspnea with chest tightness who was admitted for NSTEMI complicated by acute systolic heart failure.  Today, the bedside RN states that she went in to the patient's room at 08:00 to give her morning medications and at that time, her speech was at baseline. At around 10:00 this morning, the patient had an acute speech change described as stuttering speech and a Code Stroke was activated. Patient's granddaughter at bedside states that her grandmother has had intermittent stuttering speech over the past 2-3 days and the patient states that she feels like she is having trouble speaking with her shortness of breath.    MRI brain shows punctate acute infarct in the right anterior parietal lobe, with additional acute or subacute punctate infarcts in the left frontal lobe, right parietal lobe, and left cerebellar hemisphere. Given different vascular territories, embolic etiology is suspected.  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    MRI   Punctate acute infarct in the right anterior parietal lobe, with additional acute or subacute punctate infarcts in the left frontal lobe, right parietal lobe, and left cerebellar hemisphere. Given different vascular territories, embolic etiology is suspected.  Carotid Doppler    Velocities in the right ICA are consistent with a 1-39%  stenosis.  Velocities in the left ICA are consistent with a 1-39%  stenosis.  TEE: 12/03/2020 LEFT VENTRICLE: EF = 35-40%.   RIGHT VENTRICLE: Mild HK   LEFT ATRIUM: Moderately dilated   LEFT ATRIAL APPENDAGE: No thrombus.    RIGHT ATRIUM: Normal   AORTIC VALVE:  Trileaflet. Moderately calcified. Trivial AI.     MITRAL VALVE:    Posterior leaflet mildly restricted particularly P3 segment. 2-3+ Moderate central MR. Minimal systolic flow reversal in pulmonary veins.    TRICUSPID VALVE: Normal. Mild TR   PULMONIC VALVE: Grossly normal.Trivial PI   INTERATRIAL SEPTUM: Small PFO   PERICARDIUM: No effusion   DESCENDING AORTA: Mild plaque   LDL 57 HgbA1c 7.7 VTE prophylaxis - scd     Diet   Diet NPO time specified   aspirin 81 mg daily and clopidogrel 75 mg daily prior to admission, now on aspirin 81 mg daily and clopidogrel 75 mg daily.   Therapy recommendations:  OT: Skilled nursing-short term  Disposition:  pending  Hypertension Home meds:  amlodipine 10mg  dailly  Stable Permissive hypertension (OK if < 220/120) but gradually normalize in 5-7 days Long-term BP goal normotensive  Hyperlipidemia Home meds:  lipitor 40mg ,  toprol resumed in hospital LDL 57, goal < 70 High intensity statin   Continue statin at discharge  Diabetes type II Uncontrolled Home meds:   glucophage 1gm twice daily, actos 30mg  daily, insulin,  HgbA1c 7.7, goal < 7.0 CBGs Recent Labs    12/06/20 2126 12/07/20 0359 12/07/20 0647  GLUCAP 163* 152* 142*    SSI  Other Stroke Risk Factors  Advanced Age >/= 10   Obesity, Body mass index is 31.49 kg/m., BMI >/= 30 associated with increased stroke risk, recommend weight loss, diet and exercise as appropriate     Obstructive sleep apnea, not on CPAP at home  Congestive heart failure  Other Active Problems  STEMI CAD   Hospital day # 10   I have personally obtained history,examined this patient, reviewed notes, independently viewed imaging studies, participated in medical decision making and plan of care.ROS completed by me personally and pertinent positives fully documented  I have made any additions or clarifications directly to the above note. Agree with note above.  Patient presented with speech difficulties  following cardiac catheterization likely  related no condition of embolic strokes following the procedure.  Recommend continue dual antiplatelet therapy for coronary artery disease as per cardiology and continue ongoing aggressive risk factor modification.  Check carotid and transcranial Doppler studies.  Patient appears to be at risk for sleep apnea and is interested in considering participation in the sleep smart study for stroke prevention.  She will be given information to review and decide.  Long discussion with patient and family at the bedside and answered questions.  Discussed with Dr.Thukkani.  Greater than 50% time during this 35-minute visit was spent on counseling and coordination of care and discussion about stroke prevention sleep apnea and answering questions  Delia Heady, MD Medical Director [facility] Pager: [phone] 12/07/2020 4:58 PM   To contact Stroke Continuity provider, please refer to [email or web address]. After hours, contact General Neurology

## 2020-12-07 NOTE — Progress Notes (Signed)
Physical Therapy Treatment Patient Details Name: Brittney [name] MRN: [id number] DOB: Jun 04, 1945 Today's Date: 12/07/2020   History of Present Illness 75 y.o. female admitted 10/29 with SOB and chest tightness. Pt with multi-vessel CAD with NSTEMI. Not a surgical candidate. Right heart cath 11/3. Code stroke called 11/7 due to slurred speech, MRI (+) Punctate acute infarct in the right anterior parietal lobe, with  additional acute or subacute punctate infarcts in the left frontal  lobe, right parietal lobe, and left cerebellar hemisphere. PMHx: CAD, DM II, DTV, HLD, HTN, and CVA.    PT Comments    Pt is progressing slowly with mobility. Today's session consisted of transfers and ambulation. After encouragement and education on the importance of mobility, pt ambulated 10 ft in the room with min assist for balance. She continues to be limited by fatigue, decreased activity tolerance, and generalized weakness. Recommend SNF after discharge to address these limitations. Will continue to follow acutely to address short-term acute rehab goals.   Recommendations for follow up therapy are one component of a multi-disciplinary discharge planning process, led by the attending physician.  Recommendations may be updated based on patient status, additional functional criteria and insurance authorization.  Follow Up Recommendations  Skilled nursing-short term rehab (<3 hours/day)     Assistance Recommended at Discharge Frequent or constant Supervision/Assistance  Equipment Recommendations  Wheelchair (measurements PT)    Recommendations for Other Services       Precautions / Restrictions Precautions Precautions: Fall Precaution Comments: watch O2 Restrictions Weight Bearing Restrictions: No     Mobility  Bed Mobility Overal bed mobility: Needs Assistance Bed Mobility: Supine to Sit     Supine to sit: Min assist;HOB elevated     General bed mobility comments: min assist for trunk  elevation; took increased time    Transfers Overall transfer level: Needs assistance Equipment used: Rolling walker (2 wheels) Transfers: Sit to/from Stand Sit to Stand: Mod assist           General transfer comment: mod A to boost into standing, cues for hand placement with RW    Ambulation/Gait Ambulation/Gait assistance: Min assist;+2 safety/equipment Gait Distance (Feet): 10 Feet Assistive device: Rolling walker (2 wheels) Gait Pattern/deviations: Decreased stride length;Step-to pattern Gait velocity: decreased     General Gait Details: Pt ambulated to door with chair follow and min assist for balance recovery; pt denied further ambulation   Stairs             Wheelchair Mobility    Modified Rankin (Stroke Patients Only)       Balance Overall balance assessment: Needs assistance Sitting-balance support: Feet supported;No upper extremity supported Sitting balance-Leahy Scale: Fair Sitting balance - Comments: Pt sat EOB without physical assist   Standing balance support: Reliant on assistive device for balance;Bilateral upper extremity supported;During functional activity Standing balance-Leahy Scale: Poor Standing balance comment: dependent for posterior pericare; pt reliant on RW for static standing and ambulation; LOB during ambulation required physical assist to recover                            Cognition Arousal/Alertness: Awake/alert Behavior During Therapy: Flat affect Overall Cognitive Status: Impaired/Different from baseline Area of Impairment: Following commands;Problem solving                       Following Commands: Follows one step commands with increased time Safety/Judgement: Decreased awareness of deficits   Problem Solving: Slow processing  General Comments: required max encouragement to participate in session; pt was soaked in urine upon arrival and reports she knew she was but didn't think it was neccesary to  tell anyone        Exercises      General Comments General comments (skin integrity, edema, etc.): on 2L O2 throughout session, pt reported some dizziness after sitting up and during ambulation; BP 134/71 after ambulation      Pertinent Vitals/Pain Pain Assessment: Faces Faces Pain Scale: Hurts little more Pain Location: bottom Pain Descriptors / Indicators: Sore;Discomfort Pain Intervention(s): Limited activity within patient's tolerance;Monitored during session    Home Living                          Prior Function            PT Goals (current goals can now be found in the care plan section) Acute Rehab PT Goals Patient Stated Goal: to return home PT Goal Formulation: With patient Time For Goal Achievement: 12/16/20 Potential to Achieve Goals: Fair Progress towards PT goals: Progressing toward goals    Frequency    Min 2X/week      PT Plan Current plan remains appropriate    Co-evaluation              AM-PAC PT "6 Clicks" Mobility   Outcome Measure  Help needed turning from your back to your side while in a flat bed without using bedrails?: A Little Help needed moving from lying on your back to sitting on the side of a flat bed without using bedrails?: A Little Help needed moving to and from a bed to a chair (including a wheelchair)?: A Lot Help needed standing up from a chair using your arms (e.g., wheelchair or bedside chair)?: A Lot Help needed to walk in hospital room?: Total Help needed climbing 3-5 steps with a railing? : Total 6 Click Score: 12    End of Session Equipment Utilized During Treatment: Gait belt;Oxygen Activity Tolerance: Patient limited by fatigue Patient left: in chair;with chair alarm set;with call bell/phone within reach;with nursing/sitter in room Nurse Communication: Mobility status       Time: 0200-0234 PT Time Calculation (min) (ACUTE ONLY): 34 min  Charges:  $Therapeutic Activity: 23-37 mins                      Daryel November, SPT   Daryel November 12/07/2020, 3:39 PM

## 2020-12-07 NOTE — Progress Notes (Addendum)
This chaplain attempted spiritual care F/U to proceed with notarizing the Pt. Advance Directive this afternoon.   The Pt. is sleeping at the time of the visit.  The Pt. grand-daughter is at the bedside and appreciative of the F/U.  Chaplain Stephanie Acre [phone]  **[phone]  This chaplain plans to return with the notary and witnesses at 1pm today.

## 2020-12-07 NOTE — TOC Progression Note (Addendum)
Transition of Care [facility]) - Progression Note    Patient Details  Name: Brittney [name] MRN: [id number] Date of Birth: Feb 25, 1945  [facility]) CM/SW Contact  Wali Reinheimer, LCSWA Phone Number: 12/07/2020, 11:55 AM  Clinical Narrative:    HF CSW spoke with Ms. Galvao and her granddaughter at bedside to discuss SNF bed offers for rehab and Ms. Minnich reported a preference for a facility in [address] either [place], [place], [place], [facility] or [facility]. Only 2 of those facilities extended a bed offer. CSW heard from the patient's nurse to reach out to Ms. Knepp son, Nene Aranas [phone] or [phone] to also discuss the discharge plan with him as well. Mr. Hoban shared his frustration about trying to get information about what is going on medically with his mother and would like for updates from the medical team. CSW sent Mr. Hyneman an email with the bed offers and the Medicare.gov rating list and will reach out this afternoon to discuss options.  [facility] reported they may have a bed available tomorrow or the next day but to let them know an eta for discharge.  CSW followed up with Mr. Jordain Radin regarding the SNF bed offers and preference for [facility]. [name] added his sister Rodney Booze to the conversation and they would like to pursue Medicaid. CSW will reach out to [name] to screen Ms. Holton for Medicaid. Rodney Booze and [name] reported concerns about their mother returning home in the environment she came from and that she may end up back in the hospital as the situation doesn't seem to be conducive for her health and wellbeing. CSW informed the family that housing is a concern in [place] as not much is available and that the CSW can send them information about [facility] and making a referral so they can pursue a long term care facility such as independent or assisted living but that Medicaid will be needed for that process. Family reported  understanding and would like the CSW to see about pursuing Medicaid. CSW to reach out to [name] regarding Medicaid and will send the family information about [facility] as well.  CSW will continue to follow throughout discharge.    Expected Discharge Plan: Skilled Nursing Facility Barriers to Discharge: English as a second language teacher, SNF Pending bed offer, Continued Medical Work up  Expected Discharge Plan and Services Expected Discharge Plan: Skilled Nursing Facility In-house Referral: Clinical Social Work Discharge Planning Services: CM Consult Post Acute Care Choice: Home Health Living arrangements for the past 2 months: Single Family Home                                       Social Determinants of Health (SDOH) Interventions    Readmission Risk Interventions No flowsheet data found.  Raunel Dimartino, MSW, LCSWA [phone] Heart Failure Social Worker

## 2020-12-07 NOTE — Progress Notes (Signed)
This chaplain is present with the Pt. Pt. grand-daughter, notary, and two witnesses for the notarizing of the Pt. Advance Directive:  HCPOA.  This Pt. named Julienne Vogler as her healthcare agent.  If this person is unable or unwilling to serve as the Pt. healthcare agent the Pt. names Jacquelynne Guedes as her next choice.  The chaplain gave the original AD and two copies of the AD to the Pt.  The chaplain scanned the AD to the Pt. EMR.  This chaplain is available for F/U spiritual care as needed.  Chaplain Stephanie Acre [phone]

## 2020-12-07 NOTE — Progress Notes (Signed)
ANTICOAGULATION CONSULT NOTE - Initial Consult  Pharmacy Consult for Heparin  Indication: stroke  Allergies  Allergen Reactions   Iodinated Diagnostic Agents Nausea And Vomiting   Shellfish Allergy Nausea And Vomiting    Patient Measurements: Height: 5\' 7"  (170.2 cm) Weight: 91.2 kg (201 lb 1 oz) IBW/kg (Calculated) : 61.6   Vital Signs: Temp: 98 F (36.7 C) (11/08 1126) Temp Source: Oral (11/08 1126) BP: 127/61 (11/08 1126) Pulse Rate: 72 (11/08 1126)  Labs: Recent Labs    12/05/20 0518 12/06/20 0415 12/07/20 0345 12/07/20 1400  HGB 9.8* 9.4* 9.5*  --   HCT 30.4* 29.6* 29.1*  --   PLT 215 230 226  --   HEPARINUNFRC  --   --   --  0.28*  CREATININE 1.15* 1.16* 1.16*  --     Estimated Creatinine Clearance: 48.6 mL/min (A) (by C-G formula based on SCr of 1.16 mg/dL (H)).   Medical History: Past Medical History:  Diagnosis Date   Arthritis    Coronary artery disease    Diabetes mellitus without complication (HCC)    DVT (deep venous thrombosis) (HCC)    Heart attack (HCC)    Hyperlipidemia    Hypertension    Stroke [facility])      Assessment: 74yof with punctate lesions on brain MRI s/p cva  Plan to start low dose heparin drip today to ensure she tolerates and change to DOAC prior to DC. Heparin started 600 uts//hr heparin level 0.28  Concern for accumulation Will decrease dose   Goal of Therapy:  Heparin level <0.3  units/ml Monitor platelets by anticoagulation protocol: Yes   Plan:  Decrease heparin drip 400 uts/hr  Daily Heparin level with no up titration  Monitor s/s bleeding    [facility] Pharm.D. CPP, BCPS Clinical Pharmacist [phone] 12/07/2020 3:57 PM

## 2020-12-08 DIAGNOSIS — I5021 Acute systolic (congestive) heart failure: Secondary | ICD-10-CM | POA: Diagnosis not present

## 2020-12-08 DIAGNOSIS — I214 Non-ST elevation (NSTEMI) myocardial infarction: Secondary | ICD-10-CM | POA: Diagnosis not present

## 2020-12-08 LAB — CBC
HCT: 29.8 % — ABNORMAL LOW (ref 36.0–46.0)
Hemoglobin: 9.6 g/dL — ABNORMAL LOW (ref 12.0–15.0)
MCH: 28.8 pg (ref 26.0–34.0)
MCHC: 32.2 g/dL (ref 30.0–36.0)
MCV: 89.5 fL (ref 80.0–100.0)
Platelets: 236 10*3/uL (ref 150–400)
RBC: 3.33 MIL/uL — ABNORMAL LOW (ref 3.87–5.11)
RDW: 14.5 % (ref 11.5–15.5)
WBC: 8.8 10*3/uL (ref 4.0–10.5)
nRBC: 0 % (ref 0.0–0.2)

## 2020-12-08 LAB — GLUCOSE, CAPILLARY
Glucose-Capillary: 133 mg/dL — ABNORMAL HIGH (ref 70–99)
Glucose-Capillary: 178 mg/dL — ABNORMAL HIGH (ref 70–99)
Glucose-Capillary: 183 mg/dL — ABNORMAL HIGH (ref 70–99)
Glucose-Capillary: 268 mg/dL — ABNORMAL HIGH (ref 70–99)

## 2020-12-08 LAB — BASIC METABOLIC PANEL
Anion gap: 10 (ref 5–15)
BUN: 23 mg/dL (ref 8–23)
CO2: 30 mmol/L (ref 22–32)
Calcium: 8.5 mg/dL — ABNORMAL LOW (ref 8.9–10.3)
Chloride: 89 mmol/L — ABNORMAL LOW (ref 98–111)
Creatinine, Ser: 1.1 mg/dL — ABNORMAL HIGH (ref 0.44–1.00)
GFR, Estimated: 52 mL/min — ABNORMAL LOW (ref >60)
Glucose, Bld: 143 mg/dL — ABNORMAL HIGH (ref 70–99)
Potassium: 3.7 mmol/L (ref 3.5–5.1)
Sodium: 129 mmol/L — ABNORMAL LOW (ref 135–145)

## 2020-12-08 LAB — HEPARIN LEVEL (UNFRACTIONATED): Heparin Unfractionated: 0.21 IU/mL — ABNORMAL LOW (ref 0.30–0.70)

## 2020-12-08 LAB — MAGNESIUM: Magnesium: 2.1 mg/dL (ref 1.7–2.4)

## 2020-12-08 MED ORDER — APIXABAN 5 MG PO TABS
5.0000 mg | ORAL_TABLET | Freq: Two times a day (BID) | ORAL | Status: DC
  Administered 2020-12-08 – 2020-12-09 (×3): 5 mg via ORAL
  Filled 2020-12-08 (×3): qty 1

## 2020-12-08 MED ORDER — POTASSIUM CHLORIDE 20 MEQ PO PACK
40.0000 meq | PACK | Freq: Once | ORAL | Status: AC
  Administered 2020-12-08: 40 meq via ORAL
  Filled 2020-12-08: qty 2

## 2020-12-08 NOTE — Progress Notes (Signed)
STROKE TEAM PROGRESS NOTE   INTERVAL HISTORY No one is in the room at time of exam.  .   She states her speech is improving but is not back to baseline.  MRI scan of the brain shows mild bilateral infarcts in different vascular distributions.  Carotid ultrasound shows no significant extracranial stenosis.  Transcranial Doppler study shows normal mean flow velocities in the identified vessels of anterior circulation and absent suboccipital windows limits evaluation of posterior circulation.   She is interested in participating the sleep smart study Vitals:   12/08/20 0305 12/08/20 0545 12/08/20 0742 12/08/20 1113  BP: (!) 143/69  (!) 115/48 121/64  Pulse: 70  76 68  Resp: 13  20 13   Temp: 98.3 F (36.8 C)  97.7 F (36.5 C)   TempSrc: Oral     SpO2: 100%  100% 100%  Weight:  91 kg    Height:       CBC:  Recent Labs  Lab 12/07/20 0345 12/08/20 0049  WBC 8.9 8.8  HGB 9.5* 9.6*  HCT 29.1* 29.8*  MCV 89.8 89.5  PLT 226 236   Basic Metabolic Panel:  Recent Labs  Lab 12/07/20 0345 12/08/20 0049  NA 131* 129*  K 3.5 3.7  CL 90* 89*  CO2 33* 30  GLUCOSE 160* 143*  BUN 23 23  CREATININE 1.16* 1.10*  CALCIUM 8.7* 8.5*  MG 2.2 2.1    Lipid Panel:  Recent Labs  Lab 12/07/20 0345  CHOL 122  TRIG 66  HDL 52  CHOLHDL 2.3  VLDL 13  LDLCALC 57    HgbA1c:  Recent Labs  Lab 12/07/20 0345  HGBA1C 7.7*   Urine Drug Screen: No results for input(s): LABOPIA, COCAINSCRNUR, LABBENZ, AMPHETMU, THCU, LABBARB in the last 168 hours.  Alcohol Level No results for input(s): ETH in the last 168 hours.  IMAGING past 24 hours No results found.  PHYSICAL EXAM Obese elderly African-American lady not in distress. . Afebrile. Head is nontraumatic. Neck is supple without bruit.    Cardiac exam no murmur or gallop. Lungs are clear to auscultation. Distal pulses are well felt.  Neurological Exam ;  Awake  Alert oriented x 3.  Mostly now fluent speech and mild dysarthria.eye movements full  without nystagmus.fundi were not visualized. Vision acuity and fields appear normal. Hearing is normal. Palatal movements are normal. Face symmetric. Tongue midline. Normal strength, tone, reflexes and coordination. Normal sensation. Gait deferred.  NIHSS 2.  Premorbid modified Rankin score 0 ASSESSMENT/PLAN Ms. Atiya Yera is a 75 y.o. female with history of   coronary artery disease, type 2 diabetes mellitus, hyperlipidemia, DVT, essential hypertension, remote MI s/p stent placement, and peripheral arterial disease who presented to the ED 10/29 for evaluation of 3-4 days of progressive dyspnea with chest tightness who was admitted for NSTEMI complicated by acute systolic heart failure.    Today, the bedside RN states that she went in to the patient's room at 08:00 to give her morning medications and at that time, her speech was at baseline. At around 10:00 this morning, the patient had an acute speech change described as stuttering speech and a Code Stroke was activated. Patient's granddaughter at bedside states that her grandmother has had intermittent stuttering speech over the past 2-3 days and the patient states that she feels like she is having trouble speaking with her shortness of breath.    MRI brain shows punctate acute infarct in the right anterior parietal lobe, with additional acute or  subacute punctate infarcts in the left frontal lobe, right parietal lobe, and left cerebellar hemisphere. Given different vascular territories, embolic etiology is suspected.  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    MRI   Punctate acute infarct in the right anterior parietal lobe, with additional acute or subacute punctate infarcts in the left frontal lobe, right parietal lobe, and left cerebellar hemisphere. Given different vascular territories, embolic etiology is  suspected.  Carotid Doppler    Velocities in the right ICA are consistent with a 1-39%  stenosis.  Velocities in the left ICA are consistent with a 1-39%  stenosis.  TEE: 12/03/2020 LEFT VENTRICLE: EF = 35-40%.   RIGHT VENTRICLE: Mild HK   LEFT ATRIUM: Moderately dilated   LEFT ATRIAL APPENDAGE: No thrombus.    RIGHT ATRIUM: Normal   AORTIC VALVE:  Trileaflet. Moderately calcified. Trivial AI.    MITRAL VALVE:    Posterior leaflet mildly restricted particularly P3 segment. 2-3+ Moderate central MR. Minimal systolic flow reversal in pulmonary veins.    TRICUSPID VALVE: Normal. Mild TR   PULMONIC VALVE: Grossly normal.Trivial PI   INTERATRIAL SEPTUM: Small PFO   PERICARDIUM: No effusion   DESCENDING AORTA: Mild plaque   LDL 57 HgbA1c 7.7 VTE prophylaxis - scd     Diet   Diet Heart Room service appropriate? Yes; Fluid consistency: Thin   aspirin 81 mg daily and clopidogrel 75 mg daily prior to admission, now on aspirin 81 mg daily and clopidogrel 75 mg daily.   Therapy recommendations:  OT: Skilled nursing-short term  Disposition:  pending  Hypertension Home meds:  amlodipine 10mg  dailly  Stable Permissive hypertension (OK if < 220/120) but gradually normalize in 5-7 days Long-term BP goal normotensive  Hyperlipidemia Home meds:  lipitor 40mg ,  toprol resumed in hospital LDL 57, goal < 70 High intensity statin   Continue statin at discharge  Diabetes type II Uncontrolled Home meds:   glucophage 1gm twice daily, actos 30mg  daily, insulin,  HgbA1c 7.7, goal < 7.0 CBGs Recent Labs    12/07/20 2147 12/08/20 0603 12/08/20 1114  GLUCAP 225* 133* 178*    SSI  Other Stroke Risk Factors  Advanced Age >/= 32   Obesity, Body mass index is 31.42 kg/m., BMI >/= 30 associated with increased stroke risk, recommend weight loss, diet and exercise as appropriate     Obstructive sleep apnea, not on CPAP at home  Congestive heart failure  Other Active Problems   STEMI CAD   Hospital day # 11   Patient presented with speech difficulties following cardiac catheterization likely related no condition of embolic strokes following the procedure.  Recommend continue dual antiplatelet therapy for coronary artery disease as per cardiology and continue ongoing aggressive risk factor modification.  Cardiology has started her on IV heparin continue mobilize out of bed and ongoing therapy.  Patient appears to be at risk for sleep apnea and is interested in considering participation in the sleep smart study for stroke prevention.  She will be given information to review and decide.  Long discussion with patient and family at the bedside and answered questions.   Greater than 50% time during this 25-minute visit was spent on counseling and coordination of care and discussion about stroke prevention sleep apnea and answering questions  Antony Contras, MD Medical Director [place] Pager: [phone] 12/08/2020 1:27 PM   To contact Stroke Continuity provider, please refer to  [email or web address]. After hours, contact General Neurology  

## 2020-12-08 NOTE — Progress Notes (Addendum)
ANTICOAGULATION CONSULT NOTE - Initial Consult  Pharmacy Consult for Heparin  Indication: stroke  Allergies  Allergen Reactions   Iodinated Diagnostic Agents Nausea And Vomiting   Shellfish Allergy Nausea And Vomiting    Patient Measurements: Height: 5\' 7"  (170.2 cm) Weight: 91 kg (200 lb 9.9 oz) IBW/kg (Calculated) : 61.6   Vital Signs: Temp: 97.7 F (36.5 C) (11/09 0742) Temp Source: Oral (11/09 0305) BP: 121/64 (11/09 1113) Pulse Rate: 68 (11/09 1113)  Labs: Recent Labs    12/06/20 0415 12/07/20 0345 12/07/20 1400 12/08/20 0049 12/08/20 0825  HGB 9.4* 9.5*  --  9.6*  --   HCT 29.6* 29.1*  --  29.8*  --   PLT 230 226  --  236  --   HEPARINUNFRC  --   --  0.28*  --  0.21*  CREATININE 1.16* 1.16*  --  1.10*  --      Estimated Creatinine Clearance: 51.2 mL/min (A) (by C-G formula based on SCr of 1.1 mg/dL (H)).   Medical History: Past Medical History:  Diagnosis Date   Arthritis    Coronary artery disease    Diabetes mellitus without complication (HCC)    DVT (deep venous thrombosis) (HCC)    Heart attack (HCC)    Hyperlipidemia    Hypertension    Stroke [facility])      Assessment: 75 Y/O female with punctate lesions on brain MRI s/p CVA. Plan to start low dose heparin drip today to ensure she tolerates and change to DOAC prior to DC. Heparin level of 0.21 is at goal on 400 units/hr. CBC stable.   Goal of Therapy:  Heparin level <0.3  units/ml Monitor platelets by anticoagulation protocol: Yes   Plan:  Stop heparin infusion Start apixaban 5 mg BID  Monitor s/sx bleeding   61, PharmD PGY1 Pharmacy Resident 12/08/2020  11:16 AM  Please check [email or web address] for unit-specific pharmacy phone numbers.

## 2020-12-08 NOTE — Progress Notes (Signed)
Advanced Heart Failure Rounding Note  PCP-Cardiologist: None   Subjective:    TEE 11/4: EF 35-40% 2-3+ MR (moderate)  Brain MRI  12/06/20 due to stuttering. Multiple small punctate lesions. Stuttering has resolved  Carotid dopplers pending.   Denies CP, SOB, orthopnea or PND    Objective:   Weight Range: 91 kg Body mass index is 31.42 kg/m.   Vital Signs:   Temp:  [97.7 F (36.5 C)-98.5 F (36.9 C)] 97.7 F (36.5 C) (11/09 0742) Pulse Rate:  [69-93] 76 (11/09 0742) Resp:  [13-22] 20 (11/09 0742) BP: (115-143)/(48-69) 115/48 (11/09 0742) SpO2:  [99 %-100 %] 100 % (11/09 0742) Weight:  [91 kg] 91 kg (11/09 0545) Last BM Date: 12/06/20  Weight change: Filed Weights   12/06/20 0433 12/07/20 0339 12/08/20 0545  Weight: 91.2 kg 91.2 kg 91 kg    Intake/Output:   Intake/Output Summary (Last 24 hours) at 12/08/2020 1109 Last data filed at 12/07/2020 2200 Gross per 24 hour  Intake --  Output 500 ml  Net -500 ml      Physical Exam  General:  Well appearing. No resp difficulty HEENT: normal Neck: supple. no JVD. Carotids 2+ bilat; no bruits. No lymphadenopathy or thryomegaly appreciated. Cor: PMI nondisplaced. Regular rate & rhythm. No rubs, gallops or murmurs. Lungs: clear Abdomen: soft, nontender, nondistended. No hepatosplenomegaly. No bruits or masses. Good bowel sounds. Extremities: no cyanosis, clubbing, rash, edema Neuro: alert & orientedx3, cranial nerves grossly intact. moves all 4 extremities w/o difficulty. Affect pleasant   Telemetry   SR 70-80s  Labs    CBC Recent Labs    12/07/20 0345 12/08/20 0049  WBC 8.9 8.8  HGB 9.5* 9.6*  HCT 29.1* 29.8*  MCV 89.8 89.5  PLT 226 161   Basic Metabolic Panel Recent Labs    12/07/20 0345 12/08/20 0049  NA 131* 129*  K 3.5 3.7  CL 90* 89*  CO2 33* 30  GLUCOSE 160* 143*  BUN 23 23  CREATININE 1.16* 1.10*  CALCIUM 8.7* 8.5*  MG 2.2 2.1   Liver Function Tests No results for input(s): AST,  ALT, ALKPHOS, BILITOT, PROT, ALBUMIN in the last 72 hours.  No results for input(s): LIPASE, AMYLASE in the last 72 hours. Cardiac Enzymes No results for input(s): CKTOTAL, CKMB, CKMBINDEX, TROPONINI in the last 72 hours.  BNP: BNP (last 3 results) No results for input(s): BNP in the last 8760 hours.  ProBNP (last 3 results) No results for input(s): PROBNP in the last 8760 hours.   D-Dimer No results for input(s): DDIMER in the last 72 hours. Hemoglobin A1C Recent Labs    12/07/20 0345  HGBA1C 7.7*    Fasting Lipid Panel Recent Labs    12/07/20 0345  CHOL 122  HDL 52  LDLCALC 57  TRIG 66  CHOLHDL 2.3    Thyroid Function Tests No results for input(s): TSH, T4TOTAL, T3FREE, THYROIDAB in the last 72 hours.  Invalid input(s): FREET3  Other results:   Imaging    No results found.   Medications:     Scheduled Medications:  aspirin  81 mg Oral Daily   atorvastatin  40 mg Oral Daily   Chlorhexidine Gluconate Cloth  6 each Topical Daily   clopidogrel  75 mg Oral Daily   glycerin (Pediatric)  2 suppository Rectal Once   insulin aspart  0-15 Units Subcutaneous TID WC   insulin aspart  0-5 Units Subcutaneous QHS   mouth rinse  15 mL Mouth Rinse  BID   sacubitril-valsartan  1 tablet Oral BID   senna-docusate  1 tablet Oral QHS   sodium chloride flush  10-40 mL Intracatheter Q12H   sorbitol  30 mL Oral Once   spironolactone  12.5 mg Oral Daily   torsemide  20 mg Oral Daily    Infusions:  heparin 400 Units/hr (12/07/20 1607)     PRN Medications: acetaminophen, ondansetron (ZOFRAN) IV    Patient Profile   75 y.o. female with history of CAD s/p prior MI in 2004 (details not known), PAD, DM, HTN, HLD. Now admitted with NSTEMI c/b acute systolic HF.  Assessment/Plan   Acute systolic HF: -2/2 NSTEMI - Echo LVEF 35-40%, RV okay - RHC 12/02/20  elevated filling pressures with very prominent v-waves in PCW tracing suggestive of significant MR (likely  ischemic) - TEE 12/03/20 EF 35-40% moderate MR - Volume status ok.  torsemide 20 daily CVP 2 - Stable off midodrine.   - Continue spiro 12.5 mg daily - Continue  Entresto 24/26 bid. Keep SBP > 110  - No b-blocker or SGLT2i yet   2. NSTEMI: -Known history of CAD with prior MI in 2004 in [place] (details not known) -Severe diffuse 3 V CAD on LHC this admit.  - Cath films reviewed with Dr. Ali Lowe. PCI of ostial Lcx would be very high-risk with potential plaque shift into LM and LAD. Functional status significantly limited at baseline. Medical therapy only option at this time.  -HS troponin up to 1,249 - With addition of Eliquis wil drop Plavix. Continue ASA 81 x 30 days.  -Continue Atorvastatin 80. LDL 60 - No s/s angina   3. 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 No presence of AF on monitor - Neuro appreciated.   - Stop heparin. Start eliquis  - continue statin - PT/OT - carotid u/s completed with results pending   4. PAD: -Hx Viabahn covered stent left SFA, stent left TP trunk, PTA left posterior tibial artery 09/20 d/t CLI - ASA + statin   5. Anemia: - Stable  - hgb 9.6 -Iron stores okay -Continue to monitor -No obvious source of bleeding  6. Acute gout -Uric acid 9.3 -Right ankle pain. Has finished prednisone   7. Morbid obesity - Body mass index is 31.42 kg/m. - weight improving with diuresis  8. Physical deconditioning - spoke with nursing staff. She is very weak.  - PT recommending SNF.  TOC/SW following  9. Type II DM: -A1c 7.3 -Consider Farxiga prior to D/C -SSI  10. DNR/DNI - has met with Palliative Care. Code status updated.   Length of Stay: Herreid, NP  12/08/2020, 11:09 AM  Advanced Heart Failure Team Pager [phone] (M-F; 7a - 5p)  Please contact [facility] for night-coverage after hours (5p -7a ) and weekends on [email or web address]   Patient seen and examined with the above-signed Advanced Practice Provider and/or Housestaff. I personally  reviewed laboratory data, imaging studies and relevant notes. I independently examined the patient and formulated the important aspects of the plan. I have edited the note to reflect any of my changes or salient points. I have personally discussed the plan with the patient and/or family.  Remains very weak. Denies CP or SOB. Stuttering has resolved. CVP 2 (checked personally). Tolerating heparin without overt bleeding.   General:  Sitting up in bed. No resp difficulty HEENT: normal Neck: supple. no JVD. Carotids 2+ bilat; no bruits. No lymphadenopathy or thryomegaly appreciated. Cor: PMI nondisplaced. Regular rate & rhythm. No  rubs, gallops or murmurs. Lungs: clear Abdomen: obese soft, nontender, nondistended. No hepatosplenomegaly. No bruits or masses. Good bowel sounds. Extremities: no cyanosis, clubbing, rash, edema Neuro: alert & orientedx3, cranial nerves grossly intact. moves all 4 extremities w/o difficulty. Affect pleasant  Remains very weak and deconditioned but optimized from CAD and HF perspective. Will switch heparin to Eliquis. Continue ASA for 30 days. Drop Plavix. Continue to work on SNF placement for aggressive rehab.   Glori Bickers, MD  12:35 PM

## 2020-12-08 NOTE — TOC Progression Note (Addendum)
Transition of Care [facility]) - Progression Note    Patient Details  Name: Brittney [name] MRN: [id number] Date of Birth: 01-16-1946  [facility]) CM/SW Contact  Lazariah Savard, LCSWA Phone Number: 12/08/2020, 9:04 AM  Clinical Narrative:    HF CSW reached out to [name] to ask about screening [name] for Medicaid. CSW is awaiting a response or follow up from [name]. 9:24am - [name] reported since [name] has insurance they will not be able to refer to [facility] but they will have a Physiological scientist reach out for a screening to either the patient of family, [name] [phone].  [facility] a bed available tomorrow. Insurance auth submitted on [employer] online portal [employer]. ID#: [id number].  12:08pm - HF CSW spoke with [name] son, [name] to update him about his mother discharging tomorrow to [facility] and waiting for [employer] authorization to approve. [name] reported that he was on the phone with [name] doing a Medicaid screen for his mother and thanked the CSW for all the help.  [employer] online portal approved insurance authorization for [name] ID#: [id number] [employer] Auth ID#: [id number] SNF Approved for 12/08/2020-12/10/2020.   CSW will continue to follow throughout discharge.  Expected Discharge Plan: Skilled Nursing Facility Barriers to Discharge: English as a second language teacher, SNF Pending bed offer, Continued Medical Work up  Expected Discharge Plan and Services Expected Discharge Plan: Skilled Nursing Facility In-house Referral: Clinical Social Work Discharge Planning Services: CM Consult Post Acute Care Choice: Home Health Living arrangements for the past 2 months: Single Family Home                                       Social Determinants of Health (SDOH) Interventions    Readmission Risk Interventions No flowsheet data found.  Maryrose Colvin, MSW, LCSWA [phone] Heart Failure Social Worker

## 2020-12-08 NOTE — Progress Notes (Signed)
Occupational Therapy Treatment Patient Details Name: Brittney [name] MRN: [id number] DOB: 02/05/1945 Today's Date: 12/08/2020   History of present illness 75 y.o. female admitted 10/29 with SOB and chest tightness. Pt with multi-vessel CAD with NSTEMI. Not a surgical candidate. Right heart cath 11/3. Code stroke called 11/7 due to slurred speech, MRI (+) Punctate acute infarct in the right anterior parietal lobe, with  additional acute or subacute punctate infarcts in the left frontal  lobe, right parietal lobe, and left cerebellar hemisphere. PMHx: CAD, DM II, DTV, HLD, HTN, and CVA.   OT comments  Patient received in bed and required encouragement to participate and patient stated she was willing to perform OT seated on EOB. Patient required assistance to get to eob and to stand from EOB to RW.  Patient performed 2 stands from eob, one with patient side stepping towards head of bed, and tolerated 1.5 minutes each stand.  Patient performed UE reaching tasks seated on EOB to address balance and activity tolerance. Acute OT to continue to follow.     Recommendations for follow up therapy are one component of a multi-disciplinary discharge planning process, led by the attending physician.  Recommendations may be updated based on patient status, additional functional criteria and insurance authorization.    Follow Up Recommendations  Skilled nursing-short term rehab (<3 hours/day)    Assistance Recommended at Discharge Frequent or constant Supervision/Assistance  Equipment Recommendations  Tub/shower bench    Recommendations for Other Services      Precautions / Restrictions Precautions Precautions: Fall Precaution Comments: watch O2 Restrictions Weight Bearing Restrictions: No       Mobility Bed Mobility Overal bed mobility: Needs Assistance Bed Mobility: Supine to Sit;Sit to Supine     Supine to sit: Min assist;HOB elevated Sit to supine: Min guard   General bed mobility  comments: assistance with BLE to get back to supine    Transfers Overall transfer level: Needs assistance Equipment used: Rolling walker (2 wheels) Transfers: Sit to/from Stand Sit to Stand: Mod assist           General transfer comment: sit to stands performed from eob with mod assist to boost up and min assist for standing balance     Balance Overall balance assessment: Needs assistance Sitting-balance support: Feet supported;No upper extremity supported Sitting balance-Leahy Scale: Fair Sitting balance - Comments: Pt sat EOB without physical assist   Standing balance support: Reliant on assistive device for balance;Bilateral upper extremity supported;During functional activity Standing balance-Leahy Scale: Poor Standing balance comment: performed static standing with RW with patient reliant on RW for balance                           ADL either performed or assessed with clinical judgement   ADL                                              Extremity/Trunk Assessment Upper Extremity Assessment Upper Extremity Assessment: Defer to OT evaluation            Vision       Perception     Praxis      Cognition Arousal/Alertness: Awake/alert Behavior During Therapy: Flat affect Overall Cognitive Status: Impaired/Different from baseline Area of Impairment: Following commands;Problem solving  Following Commands: Follows one step commands with increased time Safety/Judgement: Decreased awareness of deficits   Problem Solving: Slow processing General Comments: required encouragement to participate          Exercises Exercises: General Upper Extremity General Exercises - Upper Extremity Shoulder Flexion: AROM;Both;10 reps Shoulder ABduction: AROM;Both;10 reps Shoulder ADduction: AROM;Both;10 reps   Shoulder Instructions       General Comments      Pertinent Vitals/ Pain       Pain Assessment:  Faces Faces Pain Scale: Hurts little more Pain Location: bottom Pain Descriptors / Indicators: Sore;Discomfort Pain Intervention(s): Repositioned  Home Living                                          Prior Functioning/Environment              Frequency  Min 2X/week        Progress Toward Goals  OT Goals(current goals can now be found in the care plan section)  Progress towards OT goals: Progressing toward goals  Acute Rehab OT Goals Patient Stated Goal: to rest OT Goal Formulation: With patient Time For Goal Achievement: 12/15/20 Potential to Achieve Goals: Fair ADL Goals Pt Will Perform Lower Body Bathing: sit to/from stand;with set-up Pt Will Perform Lower Body Dressing: with set-up;sit to/from stand Pt Will Transfer to Toilet: ambulating;bedside commode Pt Will Perform Tub/Shower Transfer: with supervision;tub bench;rolling walker;ambulating Additional ADL Goal #1: Pt will tolerate at least 5 minutes of OOB functional activity in preparation for incrased ability to complete ADLs  Plan Discharge plan needs to be updated    Co-evaluation                 AM-PAC OT "6 Clicks" Daily Activity     Outcome Measure   Help from another person eating meals?: None Help from another person taking care of personal grooming?: A Little Help from another person toileting, which includes using toliet, bedpan, or urinal?: A Little Help from another person bathing (including washing, rinsing, drying)?: A Lot Help from another person to put on and taking off regular upper body clothing?: A Little Help from another person to put on and taking off regular lower body clothing?: A Lot 6 Click Score: 17    End of Session Equipment Utilized During Treatment: Rolling walker (2 wheels);Oxygen  OT Visit Diagnosis: Other abnormalities of gait and mobility (R26.89);Muscle weakness (generalized) (M62.81);Pain   Activity Tolerance Patient tolerated treatment  well   Patient Left in bed;with call bell/phone within reach;with family/visitor present   Nurse Communication Mobility status        Time: 6269-4854 OT Time Calculation (min): 26 min  Charges: OT General Charges $OT Visit: 1 Visit OT Treatments $Therapeutic Activity: 23-37 mins  Alfonse Flavors, OTA Acute Rehabilitation Services  Pager [phone] Office [phone]   Dewain Penning 12/08/2020, 3:27 PM

## 2020-12-09 DIAGNOSIS — I214 Non-ST elevation (NSTEMI) myocardial infarction: Secondary | ICD-10-CM | POA: Diagnosis not present

## 2020-12-09 LAB — CBC
HCT: 31.4 % — ABNORMAL LOW (ref 36.0–46.0)
Hemoglobin: 9.9 g/dL — ABNORMAL LOW (ref 12.0–15.0)
MCH: 28 pg (ref 26.0–34.0)
MCHC: 31.5 g/dL (ref 30.0–36.0)
MCV: 89 fL (ref 80.0–100.0)
Platelets: 262 10*3/uL (ref 150–400)
RBC: 3.53 MIL/uL — ABNORMAL LOW (ref 3.87–5.11)
RDW: 14.6 % (ref 11.5–15.5)
WBC: 8.1 10*3/uL (ref 4.0–10.5)
nRBC: 0 % (ref 0.0–0.2)

## 2020-12-09 LAB — BASIC METABOLIC PANEL
Anion gap: 10 (ref 5–15)
BUN: 22 mg/dL (ref 8–23)
CO2: 31 mmol/L (ref 22–32)
Calcium: 8.8 mg/dL — ABNORMAL LOW (ref 8.9–10.3)
Chloride: 92 mmol/L — ABNORMAL LOW (ref 98–111)
Creatinine, Ser: 1.25 mg/dL — ABNORMAL HIGH (ref 0.44–1.00)
GFR, Estimated: 45 mL/min — ABNORMAL LOW (ref >60)
Glucose, Bld: 121 mg/dL — ABNORMAL HIGH (ref 70–99)
Potassium: 3.6 mmol/L (ref 3.5–5.1)
Sodium: 133 mmol/L — ABNORMAL LOW (ref 135–145)

## 2020-12-09 LAB — GLUCOSE, CAPILLARY: Glucose-Capillary: 125 mg/dL — ABNORMAL HIGH (ref 70–99)

## 2020-12-09 LAB — SARS CORONAVIRUS 2 (TAT 6-24 HRS): SARS Coronavirus 2: NEGATIVE

## 2020-12-09 LAB — MAGNESIUM: Magnesium: 2.1 mg/dL (ref 1.7–2.4)

## 2020-12-09 MED ORDER — FUROSEMIDE 40 MG PO TABS: 40.0000 mg | ORAL_TABLET | Freq: Every day | ORAL | Status: DC | PRN

## 2020-12-09 MED ORDER — EMPAGLIFLOZIN 10 MG PO TABS
10.0000 mg | ORAL_TABLET | Freq: Every day | ORAL | Status: DC
  Administered 2020-12-09: 10 mg via ORAL
  Filled 2020-12-09: qty 1

## 2020-12-09 MED ORDER — EMPAGLIFLOZIN 10 MG PO TABS: 10.0000 mg | ORAL_TABLET | Freq: Every day | ORAL | Status: AC

## 2020-12-09 MED ORDER — SACUBITRIL-VALSARTAN 24-26 MG PO TABS: 1.0000 | ORAL_TABLET | Freq: Two times a day (BID) | ORAL | 6 refills | Status: AC

## 2020-12-09 MED ORDER — SENNOSIDES-DOCUSATE SODIUM 8.6-50 MG PO TABS: 1.0000 | ORAL_TABLET | Freq: Every day | ORAL | Status: AC

## 2020-12-09 MED ORDER — FUROSEMIDE 40 MG PO TABS
40.0000 mg | ORAL_TABLET | Freq: Every day | ORAL | Status: DC
  Administered 2020-12-09: 40 mg via ORAL
  Filled 2020-12-09: qty 1

## 2020-12-09 MED ORDER — SPIRONOLACTONE 25 MG PO TABS: 12.5000 mg | ORAL_TABLET | Freq: Every day | ORAL | Status: AC

## 2020-12-09 MED ORDER — INSULIN ASPART 100 UNIT/ML IJ SOLN: 0.0000 [IU] | Freq: Three times a day (TID) | INTRAMUSCULAR | 11 refills | Status: AC

## 2020-12-09 MED ORDER — ASPIRIN 81 MG PO TABS: 81.0000 mg | ORAL_TABLET | Freq: Every day | ORAL | Status: DC

## 2020-12-09 MED ORDER — APIXABAN 5 MG PO TABS: 5.0000 mg | ORAL_TABLET | Freq: Two times a day (BID) | ORAL | Status: AC

## 2020-12-09 NOTE — TOC Transition Note (Addendum)
Transition of Care [facility]) - CM/SW Discharge Note   Patient Details  Name: Brittney [name] MRN: [id number] Date of Birth: 1945-09-25  Transition of Care [facility]) CM/SW Contact:  Ricquel Foulk, LCSWA Phone Number: 12/09/2020, 9:51 AM   Clinical Narrative:    Patient will DC to: [name] Anticipated DC date: 12/09/2020 Family notified: Yes, Son, [name] Transport by: [name]   Per MD patient ready for DC to [name], SNF. RN to call report prior to discharge [phone] room# [phone]). RN, patient, patient's family, and facility notified of DC. Discharge Summary and FL2 sent to facility. DC packet on chart. COVID test is negative. [name] did get signatures from [name] at bedside before discharge. Ambulance transport requested for patient and eta within next hour.   CSW will sign off for now as social work intervention is no longer needed. Please consult [place] again if new needs arise.     Final next level of care: Skilled Nursing Facility Barriers to Discharge: No Barriers Identified   Patient Goals and CMS Choice Patient states their goals for this hospitalization and ongoing recovery are:: okay with rehab at DC [name] Medicare.gov Compare Post Acute Care list provided to:: Patient Choice offered to / list presented to : Patient, Adult Children  Discharge Placement PASRR number recieved: 12/09/20            Patient chooses bed at: [name] Patient to be transferred to facility by: [name] Name of family member notified: Son, [name] Patient and family notified of of transfer: 12/09/20  Discharge Plan and Services In-house Referral: Clinical Social Work Discharge Planning Services: [name] Consult Post Acute Care Choice: Home Health                               Social Determinants of Health (SDOH) Interventions     Readmission Risk Interventions No flowsheet data found.    Moshe Wenger, MSW, LCSWA [phone] Heart Failure Social  Worker

## 2020-12-09 NOTE — Plan of Care (Signed)

## 2020-12-09 NOTE — Progress Notes (Addendum)
Advanced Heart Failure Rounding Note  PCP-Cardiologist: None   Subjective:    TEE 11/4: EF 35-40% 2-3+ MR (moderate)  Brain MRI  12/06/20 due to stuttering. Multiple small punctate lesions. Stuttering has resolved  Carotid dopplers  -Velocities in the right ICA are consistent with a 1-39%  stenosis.  Velocities in the left ICA are consistent with a 1-39%  stenosis.   Denies SOB. Denies pain.   Objective:   Weight Range: 91.4 kg Body mass index is 31.56 kg/m.   Vital Signs:   Temp:  [97.7 F (36.5 C)-98.7 F (37.1 C)] 97.8 F (36.6 C) (11/10 0801) Pulse Rate:  [65-78] 74 (11/10 0801) Resp:  [13-19] 17 (11/10 0801) BP: (105-127)/(46-65) 108/47 (11/10 0801) SpO2:  [99 %-100 %] 99 % (11/10 0801) Weight:  [91.4 kg] 91.4 kg (11/10 0400) Last BM Date: 12/06/20  Weight change: Filed Weights   12/07/20 0339 12/08/20 0545 12/09/20 0400  Weight: 91.2 kg 91 kg 91.4 kg    Intake/Output:   Intake/Output Summary (Last 24 hours) at 12/09/2020 0865 Last data filed at 12/08/2020 2100 Gross per 24 hour  Intake --  Output 1250 ml  Net -1250 ml      Physical Exam  General:  In bed. No resp difficulty HEENT: normal Neck: supple. no JVD. Carotids 2+ bilat; no bruits. No lymphadenopathy or thryomegaly appreciated. Cor: PMI nondisplaced. Regular rate & rhythm. No rubs, gallops or murmurs. Lungs: clear Abdomen: soft, nontender, nondistended. No hepatosplenomegaly. No bruits or masses. Good bowel sounds. Extremities: no cyanosis, clubbing, rash, edema. RUE PICC  Neuro: alert & orientedx3, cranial nerves grossly intact. moves all 4 extremities w/o difficulty. Affect pleasant  Telemetry   SR 60-70s  Labs    CBC Recent Labs    12/08/20 0049 12/09/20 0725  WBC 8.8 8.1  HGB 9.6* 9.9*  HCT 29.8* 31.4*  MCV 89.5 89.0  PLT 236 784   Basic Metabolic Panel Recent Labs    12/07/20 0345 12/08/20 0049  NA 131* 129*  K 3.5 3.7  CL 90* 89*  CO2 33* 30  GLUCOSE 160*  143*  BUN 23 23  CREATININE 1.16* 1.10*  CALCIUM 8.7* 8.5*  MG 2.2 2.1   Liver Function Tests No results for input(s): AST, ALT, ALKPHOS, BILITOT, PROT, ALBUMIN in the last 72 hours.  No results for input(s): LIPASE, AMYLASE in the last 72 hours. Cardiac Enzymes No results for input(s): CKTOTAL, CKMB, CKMBINDEX, TROPONINI in the last 72 hours.  BNP: BNP (last 3 results) No results for input(s): BNP in the last 8760 hours.  ProBNP (last 3 results) No results for input(s): PROBNP in the last 8760 hours.   D-Dimer No results for input(s): DDIMER in the last 72 hours. Hemoglobin A1C Recent Labs    12/07/20 0345  HGBA1C 7.7*    Fasting Lipid Panel Recent Labs    12/07/20 0345  CHOL 122  HDL 52  LDLCALC 57  TRIG 66  CHOLHDL 2.3    Thyroid Function Tests No results for input(s): TSH, T4TOTAL, T3FREE, THYROIDAB in the last 72 hours.  Invalid input(s): FREET3  Other results:   Imaging    No results found.   Medications:     Scheduled Medications:  apixaban  5 mg Oral BID   aspirin  81 mg Oral Daily   atorvastatin  40 mg Oral Daily   Chlorhexidine Gluconate Cloth  6 each Topical Daily   glycerin (Pediatric)  2 suppository Rectal Once   insulin aspart  0-15 Units Subcutaneous TID WC   insulin aspart  0-5 Units Subcutaneous QHS   mouth rinse  15 mL Mouth Rinse BID   sacubitril-valsartan  1 tablet Oral BID   senna-docusate  1 tablet Oral QHS   sodium chloride flush  10-40 mL Intracatheter Q12H   sorbitol  30 mL Oral Once   spironolactone  12.5 mg Oral Daily   torsemide  20 mg Oral Daily    Infusions:     PRN Medications: acetaminophen, ondansetron (ZOFRAN) IV    Patient Profile   75 y.o. female with history of CAD s/p prior MI in 2004 (details not known), PAD, DM, HTN, HLD. Now admitted with NSTEMI c/b acute systolic HF.  Assessment/Plan   Acute systolic HF: -2/2 NSTEMI - Echo LVEF 35-40%, RV okay - RHC 12/02/20  elevated filling  pressures with very prominent v-waves in PCW tracing suggestive of significant MR (likely ischemic) - TEE 12/03/20 EF 35-40% moderate MR - Volume status stable. Stop torsemide. Change to 40 mg po lasix daily with addition of jardiance 10 mg daily.   - - Continue spiro 12.5 mg daily - Continue  Entresto 24/26 bid. Keep SBP > 110  - No b-blocker   2. NSTEMI: -Known history of CAD with prior MI in 2004 in [place] (details not known) -Severe diffuse 3 V CAD on LHC this admit.  - Cath films reviewed with Dr. Ali Lowe. PCI of ostial Lcx would be very high-risk with potential plaque shift into LM and LAD. Functional status significantly limited at baseline. Medical therapy only option at this time.  -HS troponin up to 1,249 - With addition of Eliquis, plavix stopped. Continue ASA 81 x 30 days.  -Continue Atorvastatin 80. LDL 60 - No s/s angina   3. 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 No presence of AF on monitor - Neuro appreciated.   - Now on eliquis and continue aspirin for 30 days. - continue statin - PT/OT. Continue at Rehab.  - carotid u/s no significant extracranial stenosis.    4. PAD: -Hx Viabahn covered stent left SFA, stent left TP trunk, PTA left posterior tibial artery 09/20 d/t CLI - ASA + statin   5. Anemia: - Stable  - hgb 9.9  -Iron stores okay -Continue to monitor -No obvious source of bleeding  6. Acute gout -Uric acid 9.3 -Right ankle pain. Has finished prednisone   7. Morbid obesity - Body mass index is 31.56 kg/m. - weight improving with diuresis  8. Physical deconditioning - spoke with nursing staff. She is very weak.  - SNF today.   9. Type II DM: -A1c 7.3 -Start jardiance 10 mg daily.  -SSI at SNF.   10. DNR/DNI - has met with Palliative Care. Code status updated.   11/9 COVID SARS2 negative   OK for discharge to SNF today.   Length of Stay: Buna, NP  12/09/2020, 8:21 AM  Advanced Heart Failure Team Pager [phone] (M-F; 7a - 5p)   Please contact [facility] for night-coverage after hours (5p -7a ) and weekends on [email or web address]  Patient seen and examined with the above-signed Advanced Practice Provider and/or Housestaff. I personally reviewed laboratory data, imaging studies and relevant notes. I independently examined the patient and formulated the important aspects of the plan. I have edited the note to reflect any of my changes or salient points. I have personally discussed the plan with the patient and/or family.  Denies CP or SOB. No focal neuro symptoms  General:  Weal appearing. No resp difficulty HEENT: normal Neck: supple. no JVD. Carotids 2+ bilat; no bruits. No lymphadenopathy or thryomegaly appreciated. Cor: PMI nondisplaced. Regular rate & rhythm. No rubs, gallops or murmurs. Lungs: clear Abdomen: soft, nontender, nondistended. No hepatosplenomegaly. No bruits or masses. Good bowel sounds. Extremities: no cyanosis, clubbing, rash, edema Neuro: alert & orientedx3, cranial nerves grossly intact. moves all 4 extremities w/o difficulty. Affect pleasant  Ok for d/c today. Meds d/w PharmD. Agree with changes. Will arrange f/u in HF Clinic. Continue medical treatment of CAD.   Glori Bickers, MD  10:59 AM

## 2020-12-09 NOTE — Plan of Care (Signed)

## 2020-12-16 ENCOUNTER — Encounter (HOSPITAL_COMMUNITY): Payer: Self-pay

## 2020-12-16 ENCOUNTER — Ambulatory Visit (HOSPITAL_COMMUNITY)
Admit: 2020-12-16 | Discharge: 2020-12-16 | Disposition: A | Discharge status: Home / Self Care | Payer: Medicare Other | Attending: Adult Health | Admitting: Adult Health

## 2020-12-16 VITALS — BP 138/70 | HR 83

## 2020-12-16 DIAGNOSIS — I739 Peripheral vascular disease, unspecified: Secondary | ICD-10-CM | POA: Insufficient documentation

## 2020-12-16 DIAGNOSIS — Z7982 Long term (current) use of aspirin: Secondary | ICD-10-CM | POA: Diagnosis not present

## 2020-12-16 DIAGNOSIS — E785 Hyperlipidemia, unspecified: Secondary | ICD-10-CM | POA: Insufficient documentation

## 2020-12-16 DIAGNOSIS — I252 Old myocardial infarction: Secondary | ICD-10-CM | POA: Insufficient documentation

## 2020-12-16 DIAGNOSIS — I214 Non-ST elevation (NSTEMI) myocardial infarction: Secondary | ICD-10-CM | POA: Diagnosis not present

## 2020-12-16 DIAGNOSIS — Z955 Presence of coronary angioplasty implant and graft: Secondary | ICD-10-CM | POA: Diagnosis not present

## 2020-12-16 DIAGNOSIS — Z8673 Personal history of transient ischemic attack (TIA), and cerebral infarction without residual deficits: Secondary | ICD-10-CM | POA: Diagnosis not present

## 2020-12-16 DIAGNOSIS — Z7901 Long term (current) use of anticoagulants: Secondary | ICD-10-CM | POA: Insufficient documentation

## 2020-12-16 DIAGNOSIS — Z66 Do not resuscitate: Secondary | ICD-10-CM | POA: Diagnosis not present

## 2020-12-16 DIAGNOSIS — I639 Cerebral infarction, unspecified: Secondary | ICD-10-CM | POA: Diagnosis not present

## 2020-12-16 DIAGNOSIS — R110 Nausea: Secondary | ICD-10-CM | POA: Diagnosis not present

## 2020-12-16 DIAGNOSIS — R112 Nausea with vomiting, unspecified: Secondary | ICD-10-CM

## 2020-12-16 DIAGNOSIS — Z79899 Other long term (current) drug therapy: Secondary | ICD-10-CM | POA: Insufficient documentation

## 2020-12-16 DIAGNOSIS — I255 Ischemic cardiomyopathy: Secondary | ICD-10-CM | POA: Diagnosis not present

## 2020-12-16 DIAGNOSIS — E119 Type 2 diabetes mellitus without complications: Secondary | ICD-10-CM | POA: Insufficient documentation

## 2020-12-16 DIAGNOSIS — R262 Difficulty in walking, not elsewhere classified: Secondary | ICD-10-CM | POA: Insufficient documentation

## 2020-12-16 DIAGNOSIS — I5022 Chronic systolic (congestive) heart failure: Secondary | ICD-10-CM | POA: Diagnosis not present

## 2020-12-16 DIAGNOSIS — I2510 Atherosclerotic heart disease of native coronary artery without angina pectoris: Secondary | ICD-10-CM

## 2020-12-16 DIAGNOSIS — Z7984 Long term (current) use of oral hypoglycemic drugs: Secondary | ICD-10-CM | POA: Diagnosis not present

## 2020-12-16 DIAGNOSIS — I110 Hypertensive heart disease with heart failure: Secondary | ICD-10-CM | POA: Insufficient documentation

## 2020-12-16 DIAGNOSIS — M109 Gout, unspecified: Secondary | ICD-10-CM | POA: Diagnosis not present

## 2020-12-16 DIAGNOSIS — D508 Other iron deficiency anemias: Secondary | ICD-10-CM

## 2020-12-16 DIAGNOSIS — D649 Anemia, unspecified: Secondary | ICD-10-CM | POA: Diagnosis not present

## 2020-12-16 LAB — BASIC METABOLIC PANEL
Anion gap: 12 (ref 5–15)
BUN: 23 mg/dL (ref 8–23)
CO2: 27 mmol/L (ref 22–32)
Calcium: 9 mg/dL (ref 8.9–10.3)
Chloride: 93 mmol/L — ABNORMAL LOW (ref 98–111)
Creatinine, Ser: 1.29 mg/dL — ABNORMAL HIGH (ref 0.44–1.00)
GFR, Estimated: 43 mL/min — ABNORMAL LOW (ref >60)
Glucose, Bld: 172 mg/dL — ABNORMAL HIGH (ref 70–99)
Potassium: 4 mmol/L (ref 3.5–5.1)
Sodium: 132 mmol/L — ABNORMAL LOW (ref 135–145)

## 2020-12-16 LAB — CBC
HCT: 36.3 % (ref 36.0–46.0)
Hemoglobin: 11.6 g/dL — ABNORMAL LOW (ref 12.0–15.0)
MCH: 28.6 pg (ref 26.0–34.0)
MCHC: 32 g/dL (ref 30.0–36.0)
MCV: 89.6 fL (ref 80.0–100.0)
Platelets: 308 10*3/uL (ref 150–400)
RBC: 4.05 MIL/uL (ref 3.87–5.11)
RDW: 14.6 % (ref 11.5–15.5)
WBC: 6.6 10*3/uL (ref 4.0–10.5)
nRBC: 0 % (ref 0.0–0.2)

## 2020-12-16 MED ORDER — ONDANSETRON 4 MG PO TBDP
4.0000 mg | ORAL_TABLET | Freq: Once | ORAL | Status: AC
  Administered 2020-12-16: 12:00:00 4 mg via ORAL
  Filled 2020-12-16: qty 1

## 2020-12-16 NOTE — Progress Notes (Addendum)
PCP: Primary Cardiologist: Dr Haroldine Laws   HPI: Brittney [name] is a 75 y.o. female with history of CAD with prior MI and stenting in 2004, HTN, dyslipidemia, DM, hx CVA, PAD with hx CLI s/p Viabahn covered stent left SFA, stent left TP trunk, PTA left posterior tibial artery 09/20.    Admitted 11/27/2020 with NSTEMI. LHC demonstrated severe 3 vessel CAD including occluded RCA and high-grade ostial/proximal stenosis in moderate-sized LCx. Treated medically as no acute culprit and PCI LCX would be extremely high risk. LVEDP 26 mmHg. Diuresed with IV lasix. Echo 10/30: LVEF 35-40%, akinesis of inferolateral wall, distal inferior wall and apex, RV okay, mild MR.  [place] 11/02: RA 8, PA 56/21 (38), Fick CO/CI 6.9/3.3, Thermo CO/CI 5.8/2.8. Prominent v waves on PCW concerning for significant MR. TEE 11/04: 2-3 + moderate central MR.On 11/07, intermittent stuttering speech noted. Neurology consulted. MRI brain demonstrated punctate acute infarct in right anterior parietal lobe with additional acute or subacute infarcts left frontal lobe, right parietal lobe and left cerebellar hemisphere. Embolic etiology suspected. Placed eliquis and aspirin for. Aspirin will stop after 30 days. Palliative Care consulted for GOC--> DNR/DNI. MOST form completed and in VYNCA. Discharged to SNF.    Today she returns for HF follow up.Overall feeling fair. Complaining of nausea for ride to the clinic. Still unable to walk with therapy. Says she can stand but has difficulty taking steps because she is weak. Denies SOB/PND/Orthopnea. No chest pain. Appetite dair. No fever or chills. Unable to stable. All medications are provided at [facility].  Taking all medications.   ROS: All systems negative except as listed in HPI, PMH and Problem List.  SH:  Social History   Socioeconomic History   Marital status: Married    Spouse name: Not on file   Number of children: Not on file   Years of education: Not on file   Highest education level:  Not on file  Occupational History   Not on file  Tobacco Use   Smoking status: Never   Smokeless tobacco: Never  Vaping Use   Vaping Use: Never used  Substance and Sexual Activity   Alcohol use: No   Drug use: No   Sexual activity: Not on file  Other Topics Concern   Not on file  Social History Narrative   Not on file   Social Determinants of Health   Financial Resource Strain: Not on file  Food Insecurity: Not on file  Transportation Needs: Not on file  Physical Activity: Not on file  Stress: Not on file  Social Connections: Not on file  Intimate Partner Violence: Not on file    FH: History reviewed. No pertinent family history.  Past Medical History:  Diagnosis Date   Arthritis    Coronary artery disease    Diabetes mellitus without complication (HCC)    DVT (deep venous thrombosis) (HCC)    Heart attack (HCC)    Hyperlipidemia    Hypertension    Stroke [facility])     Current Outpatient Medications  Medication Sig Dispense Refill   apixaban (ELIQUIS) 5 MG TABS tablet Take 1 tablet (5 mg total) by mouth 2 (two) times daily. 60 tablet    aspirin 81 MG tablet Take 1 tablet (81 mg total) by mouth daily. 30 tablet    atorvastatin (LIPITOR) 40 MG tablet Take 40 mg by mouth at bedtime.      empagliflozin (JARDIANCE) 10 MG TABS tablet Take 1 tablet (10 mg total) by mouth daily. 30 tablet  furosemide (LASIX) 40 MG tablet Take 40 mg by mouth daily.     hydrOXYzine (ATARAX/VISTARIL) 10 MG tablet Take 10 mg by mouth 3 (three) times daily as needed for itching.     insulin aspart (NOVOLOG) 100 UNIT/ML injection Inject 0-15 Units into the skin 3 (three) times daily with meals. 10 mL 11   melatonin 5 MG TABS Take 5 mg by mouth at bedtime.     metFORMIN (GLUCOPHAGE) 1000 MG tablet Take 1,000 mg by mouth 2 (two) times daily with a meal.     sacubitril-valsartan (ENTRESTO) 24-26 MG Take 1 tablet by mouth 2 (two) times daily. 60 tablet 6   senna-docusate (SENOKOT-S) 8.6-50 MG tablet  Take 1 tablet by mouth at bedtime.     spironolactone (ALDACTONE) 25 MG tablet Take 0.5 tablets (12.5 mg total) by mouth daily.     Current Facility-Administered Medications  Medication Dose Route Frequency Provider Last Rate Last Admin   ondansetron (ZOFRAN-ODT) disintegrating tablet 4 mg  4 mg Oral Once Khamari Yousuf D, NP        Vitals:   12/16/20 1141  BP: 138/70  Pulse: 83  SpO2: 99%   Wt Readings from Last 3 Encounters:  12/09/20 91.4 kg  10/28/18 99.8 kg  10/11/18 100.4 kg  Unable to stand for weight due to weakness.   PHYSICAL EXAM: General:  Appears weak. N/V. No resp difficulty. Arrived in a wheelchair.  HEENT: normal Neck: supple. JVP flat. Carotids 2+ bilaterally; no bruits. No lymphadenopathy or thryomegaly appreciated. Cor: PMI normal. Regular rate & rhythm. No rubs, gallops or murmurs. Lungs: clear Abdomen: soft, nontender, nondistended. No hepatosplenomegaly. No bruits or masses. Good bowel sounds. Extremities: no cyanosis, clubbing, rash, edema Neuro: alert & orientedx3, cranial nerves grossly intact. Moves all 4 extremities w/o difficulty. Affect pleasant.   ECG: SR 77 bpm personally reviewed   ASSESSMENT & PLAN:  Chronic Systolic Heart Failure, ICM   2/2 NSTEMI - Echo LVEF 35-40%, RV okay - RHC 12/02/20  elevated filling pressures with very prominent v-waves in PCW tracing suggestive of significant MR (likely ischemic) - TEE 12/03/20 EF 35-40% moderate MR.  -Repeat ECHO in 3-4 months after HF meds optimized.  - Functional class difficult to assess due to ongoing weakness.  - Volume status stable. Continue lasix 40 mg daily  - Continue 12.5 mg spironolactone daily - Continue entresto 24-26 mg tiwce a day   - Continue jardiance 10 mg dialy  - next visit start bb  -Check BMET    2. CAD: -Known history of CAD with prior MI in 2004 in [place] (details not known) -11/27/20 Severe diffuse 3 V CAD on LHC. Cath films reviewed with Dr. Ali Lowe. PCI of ostial  Lcx would be very high-risk with potential plaque shift into LM and LAD. F -  Medical therapy only option at this time.  -Continue eliquis+ ASA 81 x 30 days. Stop ASA 01/05/21 -Continue Atorvastatin 80. LDL 60 - No chest pain.    3. CVA - 12/06/20 ikely cardio-embolic. No presence of AF on monitor - Continue eliquis and continue aspirin for 30 days. - continue statin - carotid u/s no significant extracranial stenosis.    4. PAD: -Hx Viabahn covered stent left SFA, stent left TP trunk, PTA left posterior tibial artery 09/20 d/t CLI - On eliquis + ASA + statin    5. Anemia: - -Iron stores okay -Check CBC today    6. Gout -  7.. Type II DM: -A1c 7.3 -  Continue  jardiance 10 mg daily.  -SSI at SNF.    8. Nausea  ?Nausea due to car ride over? Given 4 mg zofran in the clinic and she is already on this as needed.  Resolved.   9. DNR/DNI - has met with Palliative Care. Code status updated. MOST form in VYNCA  Follow up in 3-4 weeks. Need to make sure she is taking eliquis twice a day. If discharged from SNF will need to make sure medications prescribed and HH.   Check CBC/BMET today.   Jaeley Wiker NP-C  3:21 PM

## 2020-12-16 NOTE — Patient Instructions (Signed)
STOP Aspirin on 01/05/2021  Labs today We will only contact you if something comes back abnormal or we need to make some changes. Otherwise no news is good news!  Your physician recommends that you schedule a follow-up appointment in: 3-4 weeks  in the Advanced Practitioners (PA/NP) Clinic    Do the following things EVERYDAY: Weigh yourself in the morning before breakfast. Write it down and keep it in a log. Take your medicines as prescribed Eat low salt foods--Limit salt (sodium) to 2000 mg per day.  Stay as active as you can everyday Limit all fluids for the day to less than 2 liters  At the Advanced Heart Failure Clinic, you and your health needs are our priority. As part of our continuing mission to provide you with exceptional heart care, we have created designated Provider Care Teams. These Care Teams include your primary Cardiologist (physician) and Advanced Practice Providers (APPs- Physician Assistants and Nurse Practitioners) who all work together to provide you with the care you need, when you need it.   You may see any of the following providers on your designated Care Team at your next follow up: Dr Arvilla Meres Dr Carron Curie, NP Robbie Lis, [place] [facility] [place], [place] Karle Plumber, PharmD   Please be sure to bring in all your medications bottles to every appointment.

## 2021-01-13 NOTE — Progress Notes (Incomplete)
PCP: Kristopher Glee., MD HF Cardiologist: Dr Haroldine Laws   HPI: Brittney [name] is a 75 y.o. female with history of CAD with prior MI and stenting in 2004, HTN, dyslipidemia, DM, hx CVA, PAD with hx CLI s/p Viabahn covered stent left SFA, stent left TP trunk, PTA left posterior tibial artery 09/20.    Admitted 11/27/2020 with NSTEMI. LHC demonstrated severe 3 vessel CAD including occluded RCA and high-grade ostial/proximal stenosis in moderate-sized LCx. Treated medically as no acute culprit and PCI LCX would be extremely high risk. LVEDP 26 mmHg. Diuresed with IV lasix. Echo 10/30: LVEF 35-40%, akinesis of inferolateral wall, distal inferior wall and apex, RV okay, mild MR.  [place] 11/02: RA 8, PA 56/21 (38), Fick CO/CI 6.9/3.3, Thermo CO/CI 5.8/2.8. Prominent v waves on PCW concerning for significant MR. TEE 11/04: 2-3 + moderate central MR.On 11/07, intermittent stuttering speech noted. Neurology consulted. MRI brain demonstrated punctate acute infarct in right anterior parietal lobe with additional acute or subacute infarcts left frontal lobe, right parietal lobe and left cerebellar hemisphere. Embolic etiology suspected. Placed eliquis and aspirin for. Aspirin will stop after 30 days. Palliative Care consulted for GOC--> DNR/DNI. MOST form completed and in [facility]. Discharged to SNF.    Today she returns for HF follow up.Overall feeling fair. Complaining of nausea for ride to the clinic. Still unable to walk with therapy. Says she can stand but has difficulty taking steps because she is weak. Denies SOB/PND/Orthopnea. No chest pain. Appetite dair. No fever or chills. Unable to stable. All medications are provided at [facility].  Taking all medications.   ROS: All systems negative except as listed in HPI, PMH and Problem List.  SH:  Social History   Socioeconomic History   Marital status: Married    Spouse name: Not on file   Number of children: Not on file   Years of education: Not on file   Highest  education level: Not on file  Occupational History   Not on file  Tobacco Use   Smoking status: Never   Smokeless tobacco: Never  Vaping Use   Vaping Use: Never used  Substance and Sexual Activity   Alcohol use: No   Drug use: No   Sexual activity: Not on file  Other Topics Concern   Not on file  Social History Narrative   Not on file   Social Determinants of Health   Financial Resource Strain: Not on file  Food Insecurity: Not on file  Transportation Needs: Not on file  Physical Activity: Not on file  Stress: Not on file  Social Connections: Not on file  Intimate Partner Violence: Not on file    FH: No family history on file.  Past Medical History:  Diagnosis Date   Arthritis    Coronary artery disease    Diabetes mellitus without complication (HCC)    DVT (deep venous thrombosis) (HCC)    Heart attack (HCC)    Hyperlipidemia    Hypertension    Stroke [facility])     Current Outpatient Medications  Medication Sig Dispense Refill   apixaban (ELIQUIS) 5 MG TABS tablet Take 1 tablet (5 mg total) by mouth 2 (two) times daily. 60 tablet    atorvastatin (LIPITOR) 40 MG tablet Take 40 mg by mouth at bedtime.      empagliflozin (JARDIANCE) 10 MG TABS tablet Take 1 tablet (10 mg total) by mouth daily. 30 tablet    furosemide (LASIX) 40 MG tablet Take 40 mg by mouth daily.  hydrOXYzine (ATARAX/VISTARIL) 10 MG tablet Take 10 mg by mouth 3 (three) times daily as needed for itching.     insulin aspart (NOVOLOG) 100 UNIT/ML injection Inject 0-15 Units into the skin 3 (three) times daily with meals. 10 mL 11   melatonin 5 MG TABS Take 5 mg by mouth at bedtime.     metFORMIN (GLUCOPHAGE) 1000 MG tablet Take 1,000 mg by mouth 2 (two) times daily with a meal.     sacubitril-valsartan (ENTRESTO) 24-26 MG Take 1 tablet by mouth 2 (two) times daily. 60 tablet 6   senna-docusate (SENOKOT-S) 8.6-50 MG tablet Take 1 tablet by mouth at bedtime.     spironolactone (ALDACTONE) 25 MG tablet  Take 0.5 tablets (12.5 mg total) by mouth daily.     No current facility-administered medications for this visit.    There were no vitals filed for this visit.  Wt Readings from Last 3 Encounters:  12/09/20 91.4 kg (201 lb 8 oz)  10/28/18 99.8 kg (220 lb)  10/11/18 100.4 kg (221 lb 4.8 oz)  Unable to stand for weight due to weakness.   PHYSICAL EXAM: General:  Appears weak. N/V. No resp difficulty. Arrived in a wheelchair.  HEENT: normal Neck: supple. JVP flat. Carotids 2+ bilaterally; no bruits. No lymphadenopathy or thryomegaly appreciated. Cor: PMI normal. Regular rate & rhythm. No rubs, gallops or murmurs. Lungs: clear Abdomen: soft, nontender, nondistended. No hepatosplenomegaly. No bruits or masses. Good bowel sounds. Extremities: no cyanosis, clubbing, rash, edema Neuro: alert & orientedx3, cranial nerves grossly intact. Moves all 4 extremities w/o difficulty. Affect pleasant.   ECG: SR 77 bpm personally reviewed   ASSESSMENT & PLAN:  Chronic Systolic Heart Failure, ICM   2/2 NSTEMI - Echo LVEF 35-40%, RV okay - RHC 12/02/20  elevated filling pressures with very prominent v-waves in PCW tracing suggestive of significant MR (likely ischemic) - TEE 12/03/20 EF 35-40% moderate MR.  -Repeat ECHO in 3-4 months after HF meds optimized.  - Functional class difficult to assess due to ongoing weakness.  - Volume status stable. Continue lasix 40 mg daily  - Continue 12.5 mg spironolactone daily - Continue entresto 24-26 mg tiwce a day   - Continue jardiance 10 mg dialy  - next visit start bb  -Check BMET    2. CAD: -Known history of CAD with prior MI in 2004 in [place] (details not known) -11/27/20 Severe diffuse 3 V CAD on LHC. Cath films reviewed with Dr. Ali Lowe. PCI of ostial Lcx would be very high-risk with potential plaque shift into LM and LAD. F -  Medical therapy only option at this time.  -Continue eliquis+ ASA 81 x 30 days. Stop ASA 01/05/21 -Continue  Atorvastatin 80. LDL 60 - No chest pain.    3. CVA - 12/06/20 ikely cardio-embolic. No presence of AF on monitor - Continue eliquis and continue aspirin for 30 days. - continue statin - carotid u/s no significant extracranial stenosis.    4. PAD: -Hx Viabahn covered stent left SFA, stent left TP trunk, PTA left posterior tibial artery 09/20 d/t CLI - On eliquis + ASA + statin    5. Anemia: - -Iron stores okay -Check CBC today    6. Gout -  7.. Type II DM: -A1c 7.3 -Continue  jardiance 10 mg daily.  -SSI at SNF.    8. Nausea  ?Nausea due to car ride over? Given 4 mg zofran in the clinic and she is already on this as needed.  Resolved.  9. DNR/DNI - has met with Palliative Care. Code status updated. MOST form in VYNCA  Follow up in 3-4 weeks. Need to make sure she is taking eliquis twice a day. If discharged from SNF will need to make sure medications prescribed and HH.   Check CBC/BMET today.   Maricela Bo Sewell Pitner NP-C  4:06 PM

## 2021-01-14 ENCOUNTER — Encounter (HOSPITAL_COMMUNITY): Payer: Medicare Other

## 2021-01-27 ENCOUNTER — Encounter (HOSPITAL_COMMUNITY): Payer: Medicare Other

## 2023-03-17 IMAGING — MR MR HEAD W/O CM
7 of 12 series · 26 of 48 positions shown · non-contrast
Comparison: 01/02/2012

CLINICAL DATA: Neuro deficit, stroke suspected

EXAM:
MRI HEAD WITHOUT CONTRAST
TECHNIQUE: Multiplanar, multiecho pulse sequences of the brain and surrounding
structures were obtained without intravenous contrast.

[Series 2: DWI · axial · 3.0mm · 0.94mm/px · z∈[-72,+73]mm · 6 of 99 slices shown (1 of 2)]
[im 1/99]
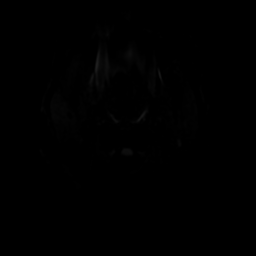
[im 20/99]
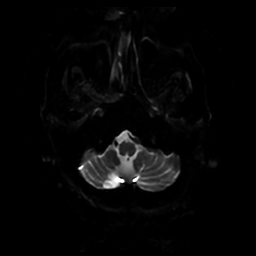
[im 40/99]
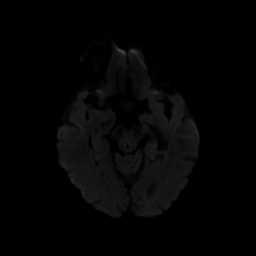
[im 59/99]
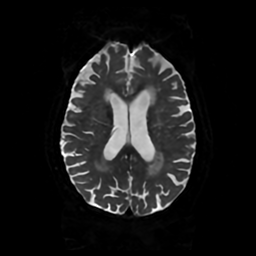
[im 79/99]
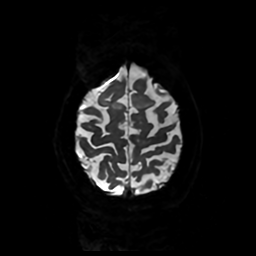
[im 99/99]
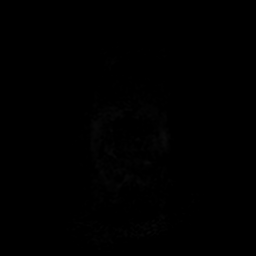

[Series 3: DWI · coronal · 4.0mm · 0.94mm/px · 5 of 74 slices shown (2 of 2)]
[im 1/74]
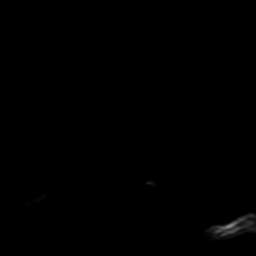
[im 19/74]
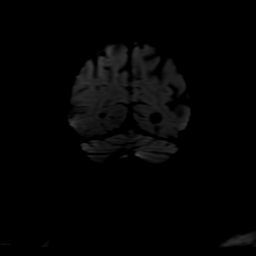
[im 37/74]
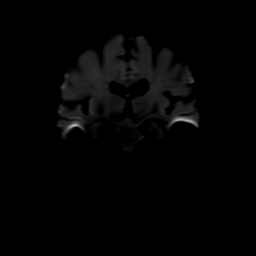
[im 55/74]
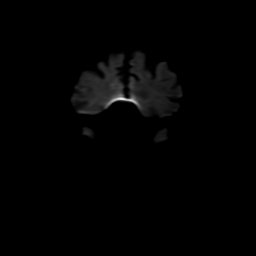
[im 74/74]
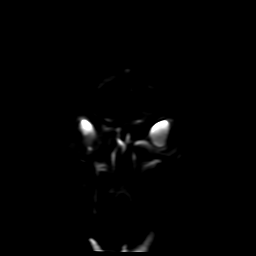

[Series 4: FLAIR · sagittal · 5.0mm · 0.23mm/px · 2 of 23 slices shown (1 of 3)]
[im 1/23]
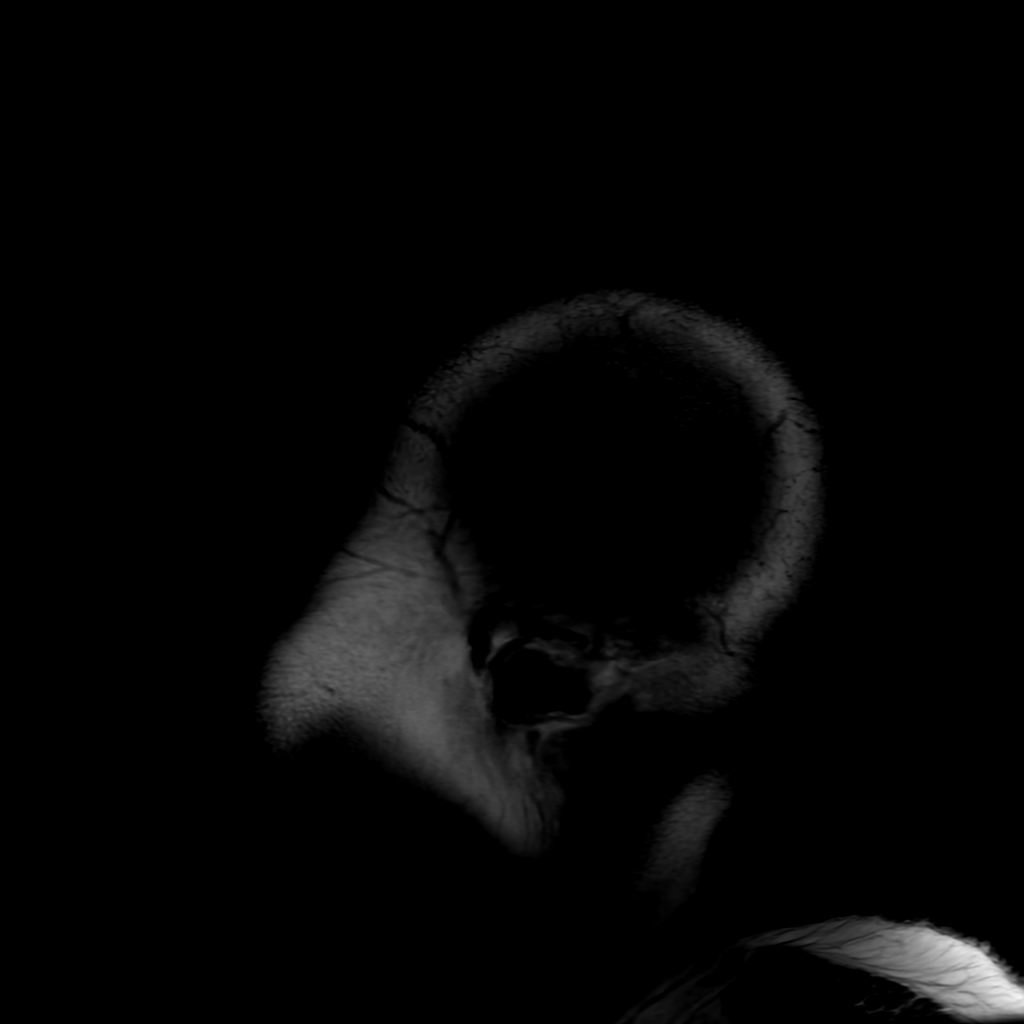
[im 23/23]
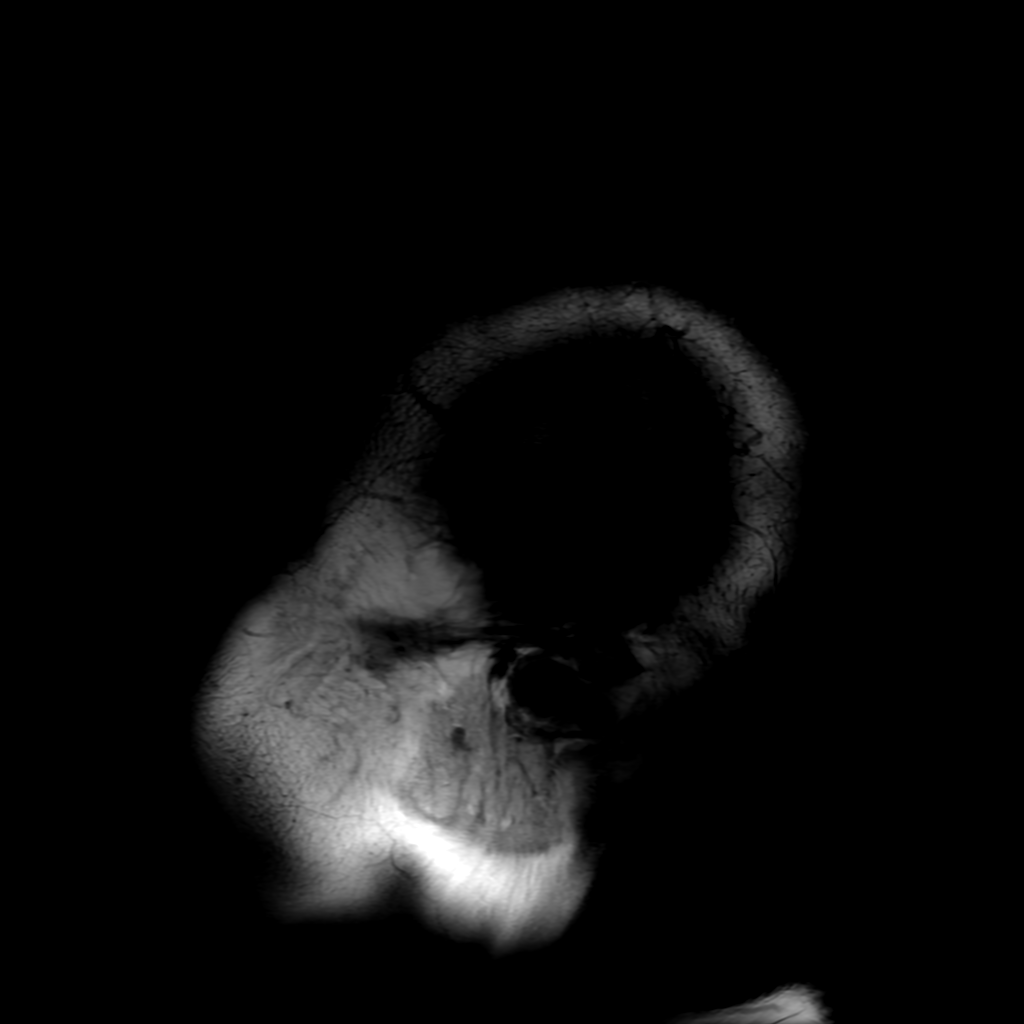

[Series 6: FLAIR · axial · 4.0mm · 0.47mm/px · z∈[-86,+58]mm · 3 of 34 slices shown (2 of 3)]
[im 1/34]
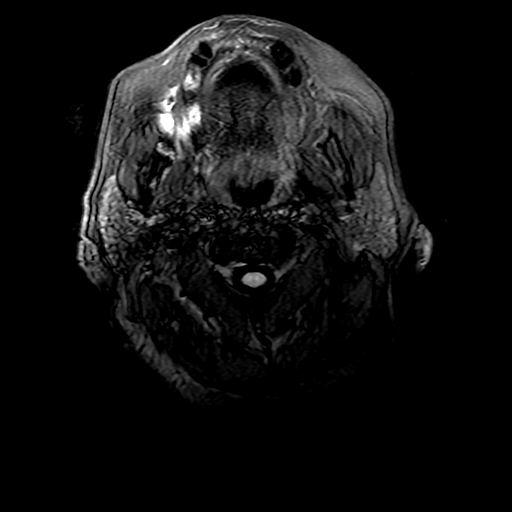
[im 17/34]
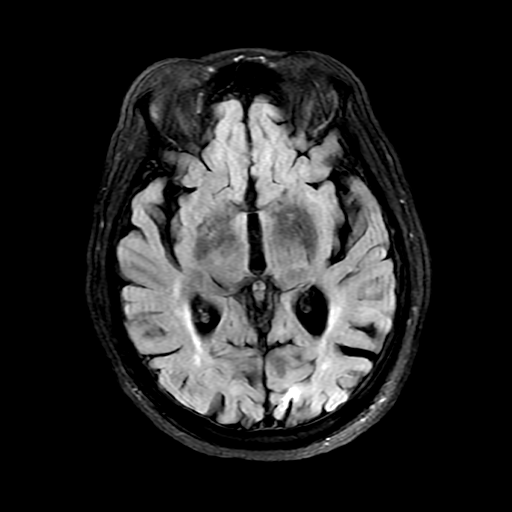
[im 34/34]
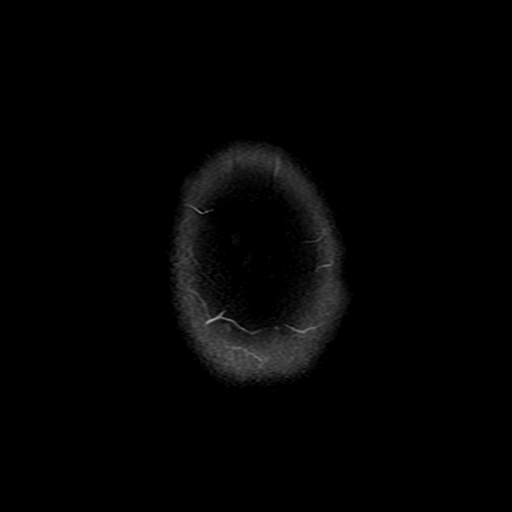

[Series 10: FLAIR · axial · 4.0mm · 0.47mm/px · z∈[-86,+58]mm · 3 of 34 slices shown (3 of 3)]
[im 1/34]
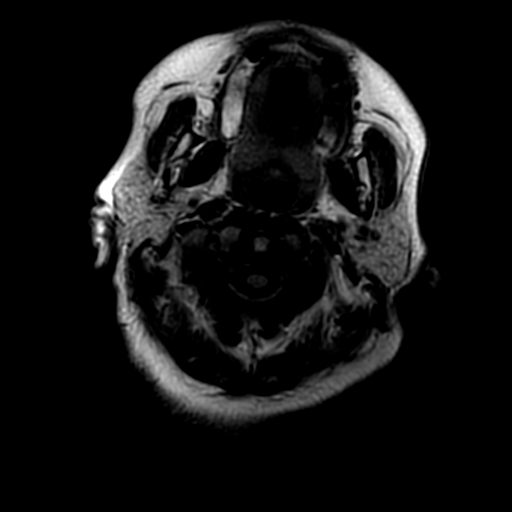
[im 17/34]
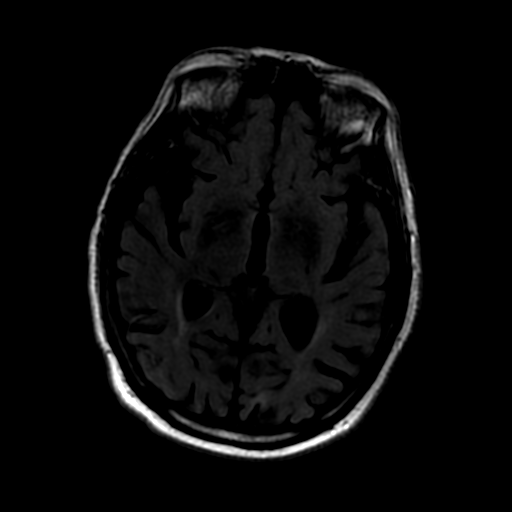
[im 34/34]
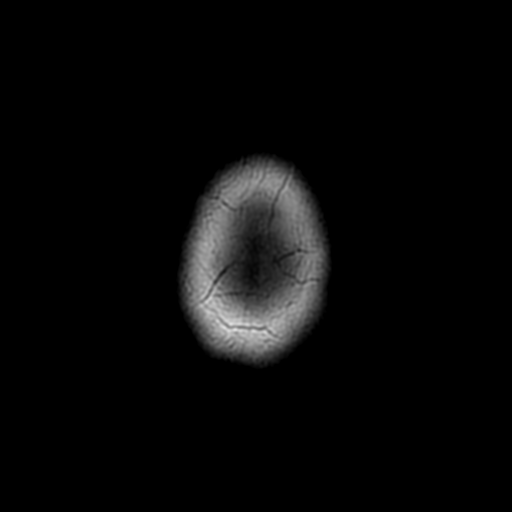

[Series 250: ADC · axial · 3.0mm · 0.94mm/px · z∈[-72,+73]mm · 4 of 50 slices shown (1 of 2)]
[im 1/50]
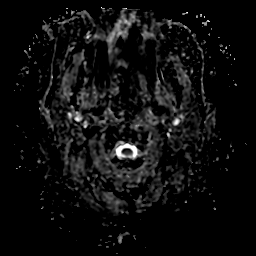
[im 17/50]
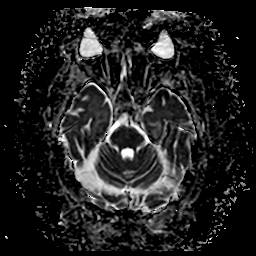
[im 33/50]
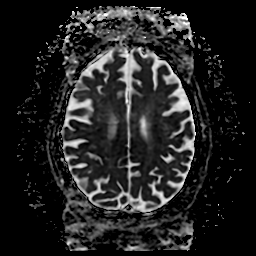
[im 50/50]
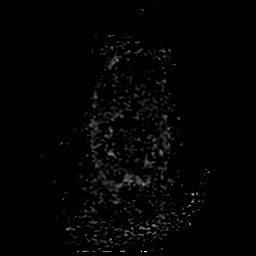

[Series 350: ADC · coronal · 4.0mm · 0.94mm/px · 3 of 37 slices shown (2 of 2)]
[im 1/37]
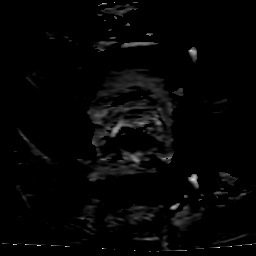
[im 19/37]
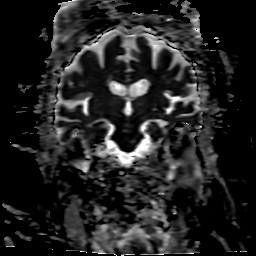
[im 37/37]
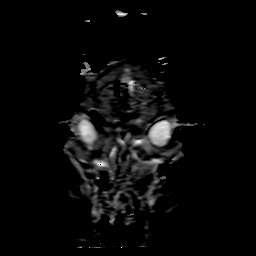

[26 of 48 positions shown; findings below may reference images not displayed]

FINDINGS: Brain: Focus of restricted diffusion with ADC correlate in the right
anterior parietal lobe (series 2, image 33-37). Additional less
intense foci of restricted diffusion with possible ADC correlates
are also seen in the left frontal (series 2, image 33) and right
parietal lobes (series 2, image 34), left inferior cerebellar
peduncle (series 2, image 14), and left cerebellum (series 2, images
12 and 16). These are not associated with definite increased T2
signal, although evaluation on the FLAIR is limited by motion
artifact; no increased T2 signal is seen on the FLAIR propeller
(less motion sensitive) sequence.

No acute hemorrhage, mass, mass effect, or midline shift. No
hydrocephalus or extra-axial collection. Confluent T2 hyperintense
signal in the periventricular white matter, likely the sequela of
severe chronic small vessel ischemic disease.

Vascular: Normal flow voids.

Skull and upper cervical spine: Normal marrow signal.

Sinuses/Orbits: Negative.  Status post bilateral lens replacements.

Other: None.
IMPRESSION: Punctate acute infarct in the right anterior parietal lobe, with
additional acute or subacute punctate infarcts in the left frontal
lobe, right parietal lobe, and left cerebellar hemisphere. Given
different vascular territories, embolic etiology is suspected.

These results were called by telephone at the time of interpretation
on 12/06/2020 at [DATE] to provider DIPLOMACY ZIZO, who verbally
acknowledged these results.
